# Patient Record
Sex: Female | Born: 1965 | Race: White | Hispanic: No | Marital: Single | Smoking: Former smoker
Health system: Southern US, Community
[De-identification: ages and names within clinical notes are randomized; demographics above are authoritative.]

## PROBLEM LIST (undated history)

## (undated) DIAGNOSIS — J189 Pneumonia, unspecified organism: Secondary | ICD-10-CM

## (undated) DIAGNOSIS — R296 Repeated falls: Secondary | ICD-10-CM

## (undated) DIAGNOSIS — J449 Chronic obstructive pulmonary disease, unspecified: Secondary | ICD-10-CM

## (undated) DIAGNOSIS — I10 Essential (primary) hypertension: Secondary | ICD-10-CM

## (undated) DIAGNOSIS — J3089 Other allergic rhinitis: Secondary | ICD-10-CM

## (undated) DIAGNOSIS — M1990 Unspecified osteoarthritis, unspecified site: Secondary | ICD-10-CM

## (undated) DIAGNOSIS — L089 Local infection of the skin and subcutaneous tissue, unspecified: Secondary | ICD-10-CM

## (undated) DIAGNOSIS — W19XXXA Unspecified fall, initial encounter: Secondary | ICD-10-CM

## (undated) DIAGNOSIS — M797 Fibromyalgia: Secondary | ICD-10-CM

## (undated) HISTORY — PX: FRACTURE SURGERY: SHX138

## (undated) HISTORY — PX: TUBAL LIGATION: SHX77

## (undated) HISTORY — PX: SHOULDER SURGERY: SHX246

## (undated) HISTORY — PX: PLANTAR FASCIA SURGERY: SHX746

## (undated) HISTORY — PX: LAPAROSCOPIC SALPINGOOPHERECTOMY: SUR795

## (undated) HISTORY — PX: TONSILLECTOMY: SUR1361

## (undated) HISTORY — PX: OTHER SURGICAL HISTORY: SHX169

## (undated) SURGERY — Surgical Case
Anesthesia: *Unknown

---

## 1998-05-26 ENCOUNTER — Ambulatory Visit: Admission: RE | Admit: 1998-05-26 | Discharge: 1998-05-26 | Payer: Self-pay | Admitting: Family Medicine

## 1998-09-23 ENCOUNTER — Ambulatory Visit (HOSPITAL_BASED_OUTPATIENT_CLINIC_OR_DEPARTMENT_OTHER): Admission: RE | Admit: 1998-09-23 | Discharge: 1998-09-23 | Payer: Self-pay | Admitting: Podiatry

## 1999-11-16 ENCOUNTER — Ambulatory Visit (HOSPITAL_COMMUNITY): Admission: RE | Admit: 1999-11-16 | Discharge: 1999-11-16 | Payer: Self-pay | Admitting: Orthopedic Surgery

## 1999-11-16 ENCOUNTER — Encounter: Payer: Self-pay | Admitting: Orthopedic Surgery

## 1999-12-15 ENCOUNTER — Encounter: Admission: RE | Admit: 1999-12-15 | Discharge: 2000-03-14 | Payer: Self-pay | Admitting: Anesthesiology

## 1999-12-15 ENCOUNTER — Encounter: Payer: Self-pay | Admitting: Anesthesiology

## 2004-04-03 ENCOUNTER — Emergency Department (HOSPITAL_COMMUNITY): Admission: EM | Admit: 2004-04-03 | Discharge: 2004-04-03 | Payer: Self-pay | Admitting: Emergency Medicine

## 2004-04-14 ENCOUNTER — Encounter: Admission: RE | Admit: 2004-04-14 | Discharge: 2004-04-14 | Payer: Self-pay | Admitting: Internal Medicine

## 2004-08-20 ENCOUNTER — Emergency Department (HOSPITAL_COMMUNITY): Admission: EM | Admit: 2004-08-20 | Discharge: 2004-08-20 | Payer: Self-pay | Admitting: Emergency Medicine

## 2005-06-01 ENCOUNTER — Emergency Department (HOSPITAL_COMMUNITY): Admission: EM | Admit: 2005-06-01 | Discharge: 2005-06-01 | Payer: Self-pay | Admitting: Emergency Medicine

## 2007-04-24 ENCOUNTER — Ambulatory Visit: Payer: Self-pay

## 2007-08-20 ENCOUNTER — Emergency Department: Payer: Self-pay | Admitting: Emergency Medicine

## 2008-06-01 ENCOUNTER — Ambulatory Visit: Payer: Self-pay | Admitting: Family Medicine

## 2008-06-01 ENCOUNTER — Inpatient Hospital Stay (HOSPITAL_COMMUNITY): Admission: EM | Admit: 2008-06-01 | Discharge: 2008-06-03 | Payer: Self-pay | Admitting: Emergency Medicine

## 2008-06-06 ENCOUNTER — Encounter: Payer: Self-pay | Admitting: Family Medicine

## 2008-06-07 ENCOUNTER — Telehealth: Payer: Self-pay | Admitting: *Deleted

## 2008-06-18 ENCOUNTER — Ambulatory Visit: Payer: Self-pay | Admitting: Family Medicine

## 2008-06-18 DIAGNOSIS — J988 Other specified respiratory disorders: Secondary | ICD-10-CM | POA: Insufficient documentation

## 2008-06-18 DIAGNOSIS — K5900 Constipation, unspecified: Secondary | ICD-10-CM | POA: Insufficient documentation

## 2008-06-18 DIAGNOSIS — R011 Cardiac murmur, unspecified: Secondary | ICD-10-CM | POA: Insufficient documentation

## 2008-06-20 ENCOUNTER — Ambulatory Visit: Payer: Self-pay | Admitting: Family Medicine

## 2008-06-20 DIAGNOSIS — F17200 Nicotine dependence, unspecified, uncomplicated: Secondary | ICD-10-CM | POA: Insufficient documentation

## 2008-12-08 ENCOUNTER — Emergency Department (HOSPITAL_COMMUNITY): Admission: EM | Admit: 2008-12-08 | Discharge: 2008-12-08 | Payer: Self-pay | Admitting: Emergency Medicine

## 2009-01-06 ENCOUNTER — Encounter: Admission: RE | Admit: 2009-01-06 | Discharge: 2009-01-06 | Payer: Self-pay | Admitting: Orthopedic Surgery

## 2010-07-29 ENCOUNTER — Ambulatory Visit (HOSPITAL_BASED_OUTPATIENT_CLINIC_OR_DEPARTMENT_OTHER): Admission: RE | Admit: 2010-07-29 | Discharge: 2010-07-29 | Payer: Self-pay | Admitting: Orthopedic Surgery

## 2010-10-18 ENCOUNTER — Emergency Department (HOSPITAL_COMMUNITY)
Admission: EM | Admit: 2010-10-18 | Discharge: 2010-10-18 | Payer: Self-pay | Source: Home / Self Care | Admitting: Emergency Medicine

## 2010-12-08 NOTE — Assessment & Plan Note (Signed)
Summary: Rx clinic: PFTs and smoking cessation   Vital Signs:  Patient Profile:   45 Years Old Female Height:     62 inches Weight:      223 pounds BMI:     40.93 Pulse rate:   109 / minute BP sitting:   131 / 84  (left arm)                  Visit Type:  PFT and smoking cessation  Chief Complaint:  wheezing and smoking cessation.  History of Present Illness: 45 yo WF presents for smoking cessation and PFT evaluation.  Patient recently discharged from the hospital for pneumonia.    She reports using albuterol every other day since discharge.  She has smoked for 27 years.  She smokes [place] lights, 1/2 to 3/4 ppd.  THis is about 8 to 12 mg of nicotine daily.   She waits around 15 minutes before having her first cigarette in the morning.  She states she is an 8 to 9 as far as readiness to quit.  She would like to have her quit date after her court date in September.  Barriers to smoking include stress, can smoke at work, depression, and enjoys smoking.  She wants to quit because she wants to be healthier, she wants to live life.   She reports Chantix is very expensive on her insurance.      Updated Prior Medication List: ALBUTEROL SULFATE (2.5 MG/3ML) 0.083%  NEBU (ALBUTEROL SULFATE) nebulized qid as needed      Risk Factors:  Tobacco use:  current    Counseled to quit/cut down tobacco use:  yes      Impression & Recommendations:  Problem # 1:  OTHER RESPIRATORY COMPLICATIONS (ICD-997.39) Assessment: Improved wheezing has improved slightly.  Spirometry evaluation with Pre and Post Bronchodilator reveals normal lung function.  Patient has been experiencing wheezing.Continue current albuterol treatment plan at this time.  Reviewed results of pulmonary function tests.  Pt verbalized understanding of results.  Written pt instructions provided.  TTFFC:  45   minutes.  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 Initiated bupropion. Patient denies seizure history.  Counseled on purpose, proper use, and potential adverse effects, including insomnia and dry mouth.  Written information provided:  F/U Rx Clinic Visit:  5 to 6 weeks.   F/U Phone Call: on 07/25/08 Total time with patient in face-to-face counseling: 45  minutes.  Patient seen with: Eden Lathe, PharmD student and Weston Brass, PharmD.   Her updated medication list for this problem includes:    Bupropion Hcl (smoking Deter) 150 Mg Xr12h-tab (Bupropion hcl (smoking deter)) .Marland Kitchen... Take one tablet daily on days 1 to 3, then increase to one tablet  Orders: Albuterol Sulfate Sol 1mg  unit dose (U0454) PFT Baseline-Pre/Post Bronchodiolator (PFT Baseline-Pre/Pos)   Complete Medication List: 1)  Albuterol Sulfate (2.5 Mg/21ml) 0.083% Nebu (Albuterol sulfate) .... Nebulized qid as needed 2)  Bupropion Hcl (smoking Deter) 150 Mg Xr12h-tab (Bupropion hcl (smoking deter)) .... Take one tablet daily on days 1 to 3, then increase to one tablet   Patient Instructions: 1)  Please schedule a follow-up  appointment in 5 to 6 weeks in the pharmacy clinic. 2)  Tobacco is very bad for your health and your loved ones! You Should stop smoking!. 3)  Stop Smoking Tips: Choose a Quit date. Your quit date is 07/24/08. Cut down before the Quit date. decide what you will do as a substitute when you feel the urge to smoke(gum,toothpick,exercise). 4)  Start bupropion 150 mg XR once a day for days 1 to 3, then increase to one tablet twice a day. 5)  Continue all your other medications. 6)  Follow up with Dr. Janalyn Harder as instructed.   Prescriptions: BUPROPION HCL (SMOKING DETER) 150 MG   XR12H-TAB (BUPROPION HCL (SMOKING DETER)) Take one tablet daily on days 1 to 3, then increase to one tablet  #60 x 3   Entered by:   Layani Foronda  Pharm D   Authorized by:   Angeline Slim MD   Signed by:   Steve Rattler  Pharm D on 06/20/2008   Method used:   Electronically sent to ...       [facility] [address]       [address]       [place], [place]  [address]       Ph: [phone]       Fax: [phone]   RxID:   [id number]  ] Tobacco Counseling   Currently uses tobacco.    Cigarettes      Year started smoking cigarettes:      1981     Number of packs per day smoked:      .5     Years smoked:            28     Packs per year:          182.50     Pack Years:            14  Counseled to quit/cut down on tobacco use.   Readiness to Quit:     Very Ready Cessation Stage:     Contemplative [employer] Quit Date:     07/24/2008  Quitting Barriers:      -  stress     -  depression     -  can smoke at work     -  social smoker     -  enjoys smoking  Quitting Motivators:      -  poor health     -  healthier lifestyle  Previous Quit Attempts   Previously Tried to quit:     Yes # of Previous quit attempts:     4 Longest Successful Quit Period:   2 months  Quit Methods Tried:      -  cold [place]  Reason for restarting:      -  stress     -  other  Comments: [place] seems to be a trigger for her to smoke  Smoking Cessation Plan   Readiness:     Very Ready Cessation Stage:   Contemplative [employer] Quit Date:   07/24/2008  Counseled on:      -  environment change     -  stress avoidance     -  nicotine withdrawal symptoms     -  medication use/compliance  New Medications: BUPROPION HCL (SMOKING DETER) 150 MG  XR12H-TAB (BUPROPION HCL (SMOKING DETER)) Take one tablet daily on days 1 to 3, then  increase to one tablet  New Problems: Hx of TOBACCO ABUSE (ICD-305.1)  Medications Added this Update:  BUPROPION HCL (SMOKING  DETER) 150 MG  XR12H-TAB (BUPROPION HCL (SMOKING DETER)) Take one tablet daily on days 1 to 3, then increase to one tablet  Orders Added this Update:  Albuterol Sulfate Sol 1mg  unit dose [N0272] PFT Baseline-Pre/Post Bronchodiolator [PFT Baseline-Pre/Pos]    Pulmonary Function Test Date: 06/20/2008 Height (in.): 62 Gender: Female  Pre-Spirometry FVC    Value: 2.94 L/min   Pred: 3.07 L/min     % Pred: 95 % FEV1    Value: 2.36 L     Pred: 2.59 L     % Pred: 91 % FEV1/FVC  Value: 80 %     Pred: 85 %     % Pred: 94 % FEF 25-75  Value: 2.3 L/min   Pred: 3.01 L/min     % Pred: 76 %  Post-Spirometry FVC    Value: 2.99 L/min   Pred: 3.07 L/min     % Pred: 97 % FEV1    Value: 2.43 L     Pred: 2.59 L     % Pred: 93 % FEV1/FVC  Value: 81 %     Pred: 85 %     % Pred: 95 % FEF 25-75  Value: 2.49 L/min   Pred: 3.01 L/min     % Pred: 82 %  Comments: Fair effort lung age: 86 yrs  Evaluation: normal  Recommendations: Start bupropion 150 mg XR once a day for days 1 to 3, then increase to one tablet twice a day Continue albuterol as needed

## 2010-12-08 NOTE — Miscellaneous (Signed)
Summary: needs out of work note  Clinical Lists Changes d/c from hosp Monday. was told to not go back until next week-8/3? note she has says out until 8/28. states she cannot go back yet. she was very sob while talking to me. states she saw Dr. Edythe Clarity Sr. Ta & Dr. Earlene Plater. message to all 3. she also has paperwork from [facility] that needs to be filled out asap. told her to have it dropped off but it may take a week or so. assured her I will try to get it completed & back to her asap.. her cell is [phone].[facility] CALDWELL RN  June 06, 2008 9:33 AM   Dr. Janalyn Harder is aware of patient's needs and will address them.

## 2010-12-08 NOTE — Miscellaneous (Signed)
  Clinical Lists Changes Dr Deirdre Priest filled out a "return to work" date on pink discharge form for pt.  This should be adequate.  He informed me that I should not fill out other forms from [facility] as these are for a longer term.  You can relay this message to pt.  You can return to work on the date that Dr Deirdre Priest wrote on her pink form.  Sharell Hilmer MD  June 06, 2008 12:40 PM

## 2010-12-08 NOTE — Progress Notes (Signed)
Summary: Triage  Phone Note Call from Patient Call back at [phone]   Summary of Call: Is requesting to speak with Kennon Rounds, states she is returning a call. Initial call taken by: Haydee Salter,  June 07, 2008 9:19 AM  Follow-up for Phone Call        many issues. has forms that need filling out & signed. she is not sure what they are. going back to work tomorrow. told her I will look them over but she may need to wait until her 1st appt here 8/11. Follow-up by: Golden Circle RN,  June 07, 2008 9:43 AM  Additional Follow-up for Phone Call Additional follow up Details #1::        pt here but states [facility] called her back & they do not need the forms Additional Follow-up by: Golden Circle RN,  June 07, 2008 10:09 AM

## 2011-01-21 LAB — POCT HEMOGLOBIN-HEMACUE: Hemoglobin: 16.4 g/dL — ABNORMAL HIGH (ref 12.0–15.0)

## 2011-02-16 ENCOUNTER — Emergency Department (HOSPITAL_COMMUNITY): Payer: BC Managed Care – HMO

## 2011-02-16 ENCOUNTER — Emergency Department (HOSPITAL_COMMUNITY)
Admission: EM | Admit: 2011-02-16 | Discharge: 2011-02-16 | Disposition: A | Discharge status: Home / Self Care | Payer: BC Managed Care – HMO | Attending: Emergency Medicine | Admitting: Emergency Medicine

## 2011-02-16 DIAGNOSIS — M1610 Unilateral primary osteoarthritis, unspecified hip: Secondary | ICD-10-CM | POA: Insufficient documentation

## 2011-02-16 DIAGNOSIS — M5430 Sciatica, unspecified side: Secondary | ICD-10-CM | POA: Insufficient documentation

## 2011-02-16 DIAGNOSIS — G8929 Other chronic pain: Secondary | ICD-10-CM | POA: Insufficient documentation

## 2011-02-16 DIAGNOSIS — M545 Low back pain, unspecified: Secondary | ICD-10-CM | POA: Insufficient documentation

## 2011-02-16 DIAGNOSIS — M25559 Pain in unspecified hip: Secondary | ICD-10-CM | POA: Insufficient documentation

## 2011-02-16 DIAGNOSIS — M169 Osteoarthritis of hip, unspecified: Secondary | ICD-10-CM | POA: Insufficient documentation

## 2011-02-16 DIAGNOSIS — M533 Sacrococcygeal disorders, not elsewhere classified: Secondary | ICD-10-CM | POA: Insufficient documentation
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
## 2011-03-23 NOTE — Discharge Summary (Signed)
[name], LARAMEE NO.:  [id number]   MEDICAL RECORD NO.:  [id number]          PATIENT TYPE:  INP   LOCATION:  3301                         FACILITY:  [facility]   PHYSICIAN:  Pearlean Brownie, M.D.DATE OF BIRTH:  [birth year]   DATE OF ADMISSION:  06/01/2008  DATE OF DISCHARGE:  06/03/2008                               DISCHARGE SUMMARY   PRIMARY CARE PHYSICIAN:  The patient does not have a primary care  [name] and is unassigned to [place].   DISCHARGE DIAGNOSES:  1. Pneumonia.  2. Heart murmur.   DISCHARGE MEDICATIONS:  1. Prednisone 60 mg p.o. daily x4 days.  2. Avelox 400 mg p.o. daily x4 days.  3. Protonix 40 mg p.o. b.i.d. twice daily x4 days.  4. Albuterol 90 mcg/spray MDI 2 puffs inhaled q.4-6 h. p.r.n.      shortness of breath.  5. Tussionex 5 mL p.o. b.i.d. as needed for cough.   CONSULTS:  None.   PROCEDURES:  Chest x-ray June 02, 2005, shows persistent opacity in the  left hilar region, slightly increased interstitial marking in the left  lung base.  Findings remained concerning for infection.  X-ray of the right index finger on June 01, 2008, shows that there is  soft tissue deformity.  There is a nondisplaced fracture of the distal  phalanx with definable extension to the articular surface.   LABORATORY DATA:  On discharge, sodium 137, potassium 3.9, chloride 107,  bicarb 22, BUN 6, creatinine 0.55, glucose 120, and calcium 8.9.  Urine  negative for Legionella.  Urine negative for Streptococcus pneumonia.   BRIEF HOSPITAL COURSE:  This is a 45 year old female who was admitted  for pneumonia/cough.  1. Pneumonia/cough.  The patient is not requiring oxygen.  She is      saturating at 95% to 98% on room air.  She has been afebrile during      this admission.  She still had a nonproductive cough.  Patient was      albuterol q.4 h. with q.2 h. p.r.n. and Atrovent nebulizer.  We      will transition her over to albuterol MDI for discharge.   Continue      on a course of prednisone for a total of 5 days and continue on a      course of Avelox for a total of 5 days.  Chest x-ray showed opacity      in the left hilar region.  This may be concerning for infection,      but should be followed up in 6 weeks after the patient's pneumonia      course has been cured.  At that time, a followup x-ray should be      done to compare with current x-ray.  CT is not warranted at this      time, but there are some concerns due to patient's smoking history.      Will consider futher work up with CT should xray be concerning or      indeterminate.  2. Heart murmur.  The patient has been tachycardic, has a systolic  murmur at the left upper sternal border.  We will continue to      monitor this and this will be worked up on an outpatient basis.  We      will consider an echo on an outpatient basis if the patient      continues to have the murmur.   DISCHARGE INSTRUCTIONS:  The patient is discharged home with no  restriction on her activity.  She may return to work on June 05, 2008.  The patient has been counseled on smoking cessation.   FOLLOW UP APPOINTMENTS:  I would set up an appointment for the patient  to see Dr. Madelon Lips at the [facility]  She has an  appointment there on June 20, 2008, at 11:15 for a pulmonary function  test and also smoking cessation.  She will follow up with Dr. Janalyn Harder, at the  [facility] on June 18, 2008, at 2:50.   DISCHARGE CONDITION:  The patient was discharged to home in stable  medical condition.      April Slim, MD  Electronically Signed      Pearlean Brownie, M.D.  Electronically Signed    CT/MEDQ  D:  06/03/2008  T:  06/04/2008  Job:  [id number]   cc:   Doroteo Bradford, MD

## 2011-06-01 ENCOUNTER — Emergency Department (HOSPITAL_COMMUNITY)
Admission: EM | Admit: 2011-06-01 | Discharge: 2011-06-01 | Disposition: A | Discharge status: Home / Self Care | Payer: BC Managed Care – HMO | Attending: Emergency Medicine | Admitting: Emergency Medicine

## 2011-06-01 DIAGNOSIS — L299 Pruritus, unspecified: Secondary | ICD-10-CM | POA: Insufficient documentation

## 2011-06-01 DIAGNOSIS — L259 Unspecified contact dermatitis, unspecified cause: Secondary | ICD-10-CM | POA: Insufficient documentation

## 2011-08-06 LAB — DIFFERENTIAL
Basophils Absolute: 0
Basophils Relative: 0
Eosinophils Absolute: 0
Eosinophils Relative: 1
Lymphocytes Relative: 20
Lymphs Abs: 1.4
Monocytes Absolute: 0.4
Monocytes Relative: 6
Neutro Abs: 5.3
Neutrophils Relative %: 74

## 2011-08-06 LAB — BASIC METABOLIC PANEL
BUN: 6
BUN: 8
BUN: 9
CO2: 19
CO2: 22
CO2: 26
Calcium: 8.9
Calcium: 8.9
Calcium: 9.2
Chloride: 102
Chloride: 105
Chloride: 99
Creatinine, Ser: 0.55
Creatinine, Ser: 0.61
Creatinine, Ser: 0.8
GFR calc Af Amer: >60
GFR calc Af Amer: >60
GFR calc Af Amer: >60
GFR calc non Af Amer: >60
GFR calc non Af Amer: >60
GFR calc non Af Amer: >60
Glucose, Bld: 116 — ABNORMAL HIGH
Glucose, Bld: 120 — ABNORMAL HIGH
Glucose, Bld: 184 — ABNORMAL HIGH
Potassium: 3.3 — ABNORMAL LOW
Potassium: 3.9
Potassium: 4.1
Sodium: 133 — ABNORMAL LOW
Sodium: 137
Sodium: 139

## 2011-08-06 LAB — CBC
HCT: 41.7
HCT: 44.5
Hemoglobin: 14.1
Hemoglobin: 15
MCHC: 33.7
MCHC: 33.8
MCV: 91.5
MCV: 91.7
Platelets: 233
Platelets: 237
RBC: 4.56
RBC: 4.85
RDW: 12.9
RDW: 13
WBC: 7.2
WBC: 8.3

## 2011-08-06 LAB — LEGIONELLA ANTIGEN, URINE: Legionella Antigen, Urine: NEGATIVE

## 2011-08-06 LAB — STREP PNEUMONIAE URINARY ANTIGEN: Strep Pneumo Urinary Antigen: NEGATIVE

## 2011-08-06 LAB — H1N1 SCREEN (PCR)

## 2011-08-18 ENCOUNTER — Ambulatory Visit: Payer: Self-pay | Admitting: Pain Medicine

## 2011-08-25 ENCOUNTER — Ambulatory Visit: Payer: Self-pay | Admitting: Pain Medicine

## 2011-09-14 ENCOUNTER — Ambulatory Visit: Payer: Self-pay | Admitting: Pain Medicine

## 2011-09-15 ENCOUNTER — Ambulatory Visit: Payer: Self-pay | Admitting: Pain Medicine

## 2011-10-07 ENCOUNTER — Ambulatory Visit: Payer: Self-pay | Admitting: Pain Medicine

## 2011-10-18 ENCOUNTER — Ambulatory Visit: Payer: Self-pay | Admitting: Pain Medicine

## 2012-09-20 ENCOUNTER — Ambulatory Visit: Payer: Self-pay | Admitting: Obstetrics and Gynecology

## 2012-09-20 LAB — BASIC METABOLIC PANEL
Anion Gap: 8 (ref 7–16)
BUN: 17 mg/dL (ref 7–18)
Calcium, Total: 8.8 mg/dL (ref 8.5–10.1)
Chloride: 103 mmol/L (ref 98–107)
Co2: 28 mmol/L (ref 21–32)
Creatinine: 0.72 mg/dL (ref 0.60–1.30)
EGFR (African American): >60
EGFR (Non-African Amer.): >60
Glucose: 87 mg/dL (ref 65–99)
Osmolality: 278 (ref 275–301)
Potassium: 4.4 mmol/L (ref 3.5–5.1)
Sodium: 139 mmol/L (ref 136–145)

## 2012-09-20 LAB — CBC
HCT: 44 % (ref 35.0–47.0)
HGB: 14.6 g/dL (ref 12.0–16.0)
MCH: 31.2 pg (ref 26.0–34.0)
MCHC: 33.2 g/dL (ref 32.0–36.0)
MCV: 94 fL (ref 80–100)
Platelet: 377 10*3/uL (ref 150–440)
RBC: 4.69 10*6/uL (ref 3.80–5.20)
RDW: 13 % (ref 11.5–14.5)
WBC: 11.2 10*3/uL — ABNORMAL HIGH (ref 3.6–11.0)

## 2012-09-26 ENCOUNTER — Ambulatory Visit: Payer: Self-pay | Admitting: Obstetrics and Gynecology

## 2012-09-28 LAB — PATHOLOGY REPORT

## 2012-10-17 ENCOUNTER — Ambulatory Visit: Payer: Self-pay | Admitting: Obstetrics and Gynecology

## 2013-02-07 ENCOUNTER — Other Ambulatory Visit: Payer: Self-pay | Admitting: Pain Medicine

## 2013-02-07 DIAGNOSIS — M545 Low back pain, unspecified: Secondary | ICD-10-CM

## 2013-02-18 ENCOUNTER — Other Ambulatory Visit: Payer: BC Managed Care – HMO

## 2013-02-27 ENCOUNTER — Ambulatory Visit
Admission: RE | Admit: 2013-02-27 | Discharge: 2013-02-27 | Disposition: A | Discharge status: Home / Self Care | Payer: BC Managed Care – HMO | Source: Ambulatory Visit | Attending: Pain Medicine | Admitting: Pain Medicine

## 2013-02-27 DIAGNOSIS — M545 Low back pain, unspecified: Secondary | ICD-10-CM

## 2013-02-28 ENCOUNTER — Other Ambulatory Visit: Payer: BC Managed Care – HMO

## 2013-11-08 HISTORY — PX: KNEE CARTILAGE SURGERY: SHX688

## 2015-01-24 ENCOUNTER — Emergency Department (HOSPITAL_COMMUNITY)
Admission: EM | Admit: 2015-01-24 | Discharge: 2015-01-25 | Disposition: A | Discharge status: Home / Self Care | Payer: BLUE CROSS/BLUE SHIELD | Attending: Emergency Medicine | Admitting: Emergency Medicine

## 2015-01-24 ENCOUNTER — Encounter (HOSPITAL_COMMUNITY): Payer: Self-pay | Admitting: *Deleted

## 2015-01-24 DIAGNOSIS — Z720 Tobacco use: Secondary | ICD-10-CM | POA: Insufficient documentation

## 2015-01-24 DIAGNOSIS — T23201A Burn of second degree of right hand, unspecified site, initial encounter: Secondary | ICD-10-CM

## 2015-01-24 DIAGNOSIS — Y939 Activity, unspecified: Secondary | ICD-10-CM | POA: Diagnosis not present

## 2015-01-24 DIAGNOSIS — Y278XXA Contact with other hot objects, undetermined intent, initial encounter: Secondary | ICD-10-CM | POA: Diagnosis not present

## 2015-01-24 DIAGNOSIS — T23001A Burn of unspecified degree of right hand, unspecified site, initial encounter: Secondary | ICD-10-CM | POA: Diagnosis present

## 2015-01-24 DIAGNOSIS — Y999 Unspecified external cause status: Secondary | ICD-10-CM | POA: Diagnosis not present

## 2015-01-24 DIAGNOSIS — Y92000 Kitchen of unspecified non-institutional (private) residence as  the place of occurrence of the external cause: Secondary | ICD-10-CM | POA: Diagnosis not present
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
## 2015-01-25 MED ORDER — BACITRACIN ZINC 500 UNIT/GM EX OINT: 1.0000 "application " | TOPICAL_OINTMENT | Freq: Two times a day (BID) | CUTANEOUS | Status: DC

## 2015-01-25 MED ORDER — IBUPROFEN 400 MG PO TABS
800.0000 mg | ORAL_TABLET | Freq: Once | ORAL | Status: AC
  Administered 2015-01-25: 800 mg via ORAL
  Filled 2015-01-25: qty 2

## 2015-01-25 MED ORDER — ACETAMINOPHEN 325 MG PO TABS
650.0000 mg | ORAL_TABLET | Freq: Once | ORAL | Status: DC
  Filled 2015-01-25: qty 2

## 2015-01-25 NOTE — Discharge Instructions (Signed)
Keep wound clean with soap and water. Refer to attached documents for more information.

## 2015-01-25 NOTE — ED Notes (Signed)
Applied bacitracin no affected area on right hand, covered with non-adherent bandage and wrapped with stretch bandage.

## 2015-01-25 NOTE — ED Provider Notes (Signed)
CSN: [id number]     Arrival date & time 01/24/15  2254 History   First MD Initiated Contact with Patient 01/24/15 2306     Chief Complaint  Patient presents with  . Hand Burn     (Consider location/radiation/quality/duration/timing/severity/associated sxs/prior Treatment) Patient is a 49 y.o. female presenting with burn. The history is provided by the patient. No language interpreter was used.  Burn Burn location:  Hand Hand burn location:  [place] of R hand Burn quality:  Red, ruptured blister and painful Time since incident:  2 hours Progression:  Unchanged Pain details:    Severity:  Severe   Duration:  2 hours   Timing:  Constant   Progression:  Unchanged Mechanism of burn:  Hot surface Incident location:  Kitchen Relieved by:  Nothing Worsened by:  Nothing tried Ineffective treatments:  None tried Associated symptoms: no difficulty swallowing and no shortness of breath   Tetanus status:  Unknown   History reviewed. No pertinent past medical history. History reviewed. No pertinent past surgical history. No family history on file. History  Substance Use Topics  . Smoking status: Current Every Day Smoker  . Smokeless tobacco: Not on file  . Alcohol Use: Yes   OB History    No data available     Review of Systems  Constitutional: Negative for fever, chills and fatigue.  HENT: Negative for trouble swallowing.   Eyes: Negative for visual disturbance.  Respiratory: Negative for shortness of breath.   Cardiovascular: Negative for chest pain and palpitations.  Gastrointestinal: Negative for nausea, vomiting, abdominal pain and diarrhea.  Genitourinary: Negative for dysuria and difficulty urinating.  Musculoskeletal: Negative for arthralgias and neck pain.  Skin: Positive for wound. Negative for color change.  Neurological: Negative for dizziness and weakness.  Psychiatric/Behavioral: Negative for dysphoric mood.      Allergies  Augmentin and Sulfa  antibiotics  Home Medications   Prior to Admission medications   Not on File   BP 174/103 mmHg  Pulse 96  Temp(Src) 97.6 F (36.4 C)  Resp 18  Ht 5\' 1"  (1.549 m)  Wt 210 lb (95.255 kg)  BMI 39.70 kg/m2  SpO2 96% Physical Exam  Constitutional: She is oriented to person, place, and time. She appears well-developed and well-nourished. No distress.  HENT:  Head: Normocephalic and atraumatic.  Eyes: Conjunctivae and EOM are normal.  Neck: Normal range of motion.  Cardiovascular: Normal rate and regular rhythm.  Exam reveals no gallop and no friction rub.   No murmur heard. Pulmonary/Chest: Effort normal and breath sounds normal. She has no wheezes. She has no rales. She exhibits no tenderness.  Abdominal: Soft. There is no tenderness.  Musculoskeletal: Normal range of motion.  Full ROM of right hand.   Neurological: She is alert and oriented to person, place, and time. Coordination normal.  Full sensation to right hand. Speech is goal-oriented. Moves limbs without ataxia.   Skin: Skin is warm and dry.  Second degree burn to the right dorsal hand that covers a 5x4cm area. No open wounds.   Psychiatric: She has a normal mood and affect. Her behavior is normal.  Nursing note and vitals reviewed.   ED Course  Procedures (including critical care time) Labs Review Labs Reviewed - No data to display  Imaging Review No results found.   EKG Interpretation None      MDM   Final diagnoses:  Second degree burn of hand, right, initial encounter    12:14 AM Patient will have  bacitracin to the burn with instructions to gently clean the area with soap and water. Vitals stable and patient afebrile.     Alvina Chou, PA-C 01/25/15 0020  Fredia Sorrow, MD 01/25/15 [phone]

## 2015-02-25 NOTE — Op Note (Signed)
PATIENT NAME:  [name], [name] MR#:  [id number] DATE OF BIRTH:  Dec 24, 1965  DATE OF PROCEDURE:  09/26/2012  PREOPERATIVE DIAGNOSES: Postmenopausal bleeding with thickened endometrial stripe on ultrasound as well as bilateral ovarian cysts.   POSTOPERATIVE DIAGNOSES: Postmenopausal bleeding with thickened endometrial stripe on ultrasound as well as bilateral ovarian cysts including endometrial polyp.   OPERATIONS PERFORMED:  1. Hysteroscopy.  2. Dilatation and curettage. 3. Laparoscopic bilateral salpingo-oophorectomy.   ANESTHESIA USED: General.   PRIMARY SURGEON: Stoney Bang. Georgianne Fick, MD  ASSISTANT: Dr. Verlene Mayer   ESTIMATED BLOOD LOSS: 50 mL.  OPERATIVE FLUIDS: 1 liter of crystalloid.   COMPLICATIONS: None.   PREOPERATIVE ANTIBIOTICS: None.   INTRAOPERATIVE FINDINGS: Hysteroscopy revealed an endometrial polyp filling the uterine cavity. This was removed in its entirety using polyp forceps. The laparoscopic portion of the case revealed a left ovarian simple cyst. The right ovary appeared grossly normal as it contained a cyst as well on ultrasound. Both ovaries removed without spillage using a reusable Endo Catch bag.   SPECIMENS REMOVED: Right tube and ovary, left tube and ovary as well as endometrial curettings and polyp.   PATIENT CONDITION FOLLOWING PROCEDURE: Stable.   PROCEDURE IN DETAIL: Risks, benefits, and alternatives of procedure were discussed with the patient prior to proceeding to the Operating Room. Given the patient's finding of bilateral ovarian cysts in a postmenopausal patient and given the fact that she had initially presented with postmenopausal bleeding with a thickened endometrial stripe and was going to the Operating Room for further evaluation CA-125 was obtained preoperatively and was noted to be normal. Decision was made to proceed with bilateral salpingo-oophorectomy for evaluation of the ovarian cyst. Patient was taken to the Operating Room were  general anesthesia was administered. She was positioned in the dorsal lithotomy position, prepped and draped in the usual sterile fashion. Attention was first turned to the patient's pelvis for the hysteroscopic portion of the case. A Foley catheter was placed and following placement of the Foley catheter the anterior lip of the cervix was visualized using a sterile speculum and grasped with a single-tooth tenaculum. Following this, the cervix was serially dilated using Hegar dilators. Following dilation of the cervix hysteroscopy was performed noting the above findings. The polyp was then removed using polyp forceps. Sharp curettage was performed noting good uterine cry throughout. Post dilatation and curettage hysteroscopy revealed complete removal of the polyp. Following the hysteroscopic portion of the case a Hulka tenaculum was placed to allow uterine manipulation. Attention was then turned to the patient's abdomen. The umbilicus was infiltrated with 1% lidocaine. Stab incision was made at the base of the umbilicus and a 5 mm XL trocar was used to gain entry into the peritoneum under direct visualization. Insufflation was begun. A 5 mm right assistant port and an 11 mm left assistant port were placed under direct visualization. Following placement of the assistant ports, survey of the pelvis noted the above findings. Of note, the ureter did have a small, approximately 1 to 2 cm, pedunculated anterior fundal fibroid. This was not removed. Attention was first turned to the patient's left ovary. The IP ligament was identified, ligated and transected using the 5 mm LigaSure. The fallopian tube was then transected off of the mesosalpinx using the 5 mm Harmonic device. The tube was transected off the corneal portion of the uterus using the 5 mm Harmonic. Following removal of the tube the utero-ovarian ligament was identified and transected using the 5 mm Harmonic. Attention was then turned  to the right adnexa which  was dissected off in a similar fashion using the 5 mm Harmonic device. Following removal of both tubes and ovaries a reusable Endo Catch bag was placed through the 11 mm port site and the ovaries were removed without spillage of contents. Following this all pedicles were inspected and noted to be hemostatic. The pelvis was irrigated, and the 11 mm port site was closed using an Endo Close device. Following this, there was no fascial defect noted. The remaining 5 mm ports were removed following evacuation of pneumoperitoneum. The 11 mm port site was closed with 4-0 Monocryl in subcuticular fashion. All port sites were then dressed with Dermabond.    Sponge, needle, and instrument counts were correct x2.   ____________________________ Stoney Bang. Georgianne Fick, MD ams:cms D: 09/26/2012 22:20:58 ET T: 09/27/2012 10:44:18 ET JOB#: [id number]  cc: Stoney Bang. Georgianne Fick, MD, <Dictator> Conan Bowens Madelon Lips MD ELECTRONICALLY SIGNED 10/12/2012 15:37

## 2015-03-27 ENCOUNTER — Other Ambulatory Visit: Payer: Self-pay | Admitting: Orthopedic Surgery

## 2015-04-28 NOTE — Pre-Procedure Instructions (Signed)
[name]  04/28/2015        Your procedure is scheduled on July 1st, Friday.   Report to [facility] Admitting at 5:30 A.M.  Call this number if you have problems the morning of surgery:  [phone]   Remember:  Do not eat food or drink liquids after midnight Thursday.   Take these medicines the morning of surgery with A SIP OF WATER: Nothing   Do not wear jewelry, make-up or nail polish.  Do not wear lotions, powders, or perfumes.  You may NOT  wear deodorant the day of surgery.  Do not shave underarms & legs 48 hours prior to surgery.    Do not bring valuables to the hospital.  [facility] is not responsible for any belongings or valuables.  Contacts, dentures or bridgework may not be worn into surgery.  Leave your suitcase in the car.  After surgery it may be brought to your room.  For patients admitted to the hospital, discharge time will be determined by your treatment team.  Name and phone number of your driver:     Special instructions:  "Preparing for Surgery" instruction sheet.  Please read over the following fact sheets that you were given. Pain Booklet, Coughing and Deep Breathing, MRSA Information and Surgical Site Infection Prevention

## 2015-04-29 ENCOUNTER — Encounter (HOSPITAL_COMMUNITY)
Admission: RE | Admit: 2015-04-29 | Discharge: 2015-04-29 | Disposition: A | Discharge status: Home / Self Care | Payer: BLUE CROSS/BLUE SHIELD | Source: Ambulatory Visit | Attending: Orthopedic Surgery | Admitting: Orthopedic Surgery

## 2015-04-29 ENCOUNTER — Ambulatory Visit (HOSPITAL_COMMUNITY)
Admission: RE | Admit: 2015-04-29 | Discharge: 2015-04-29 | Disposition: A | Discharge status: Home / Self Care | Payer: BLUE CROSS/BLUE SHIELD | Source: Ambulatory Visit | Attending: Orthopedic Surgery | Admitting: Orthopedic Surgery

## 2015-04-29 ENCOUNTER — Encounter (HOSPITAL_COMMUNITY): Payer: Self-pay

## 2015-04-29 DIAGNOSIS — M19012 Primary osteoarthritis, left shoulder: Secondary | ICD-10-CM | POA: Insufficient documentation

## 2015-04-29 DIAGNOSIS — Z01812 Encounter for preprocedural laboratory examination: Secondary | ICD-10-CM | POA: Diagnosis not present

## 2015-04-29 DIAGNOSIS — Z01810 Encounter for preprocedural cardiovascular examination: Secondary | ICD-10-CM | POA: Diagnosis not present

## 2015-04-29 DIAGNOSIS — Z0183 Encounter for blood typing: Secondary | ICD-10-CM | POA: Insufficient documentation

## 2015-04-29 DIAGNOSIS — Z01818 Encounter for other preprocedural examination: Secondary | ICD-10-CM

## 2015-04-29 DIAGNOSIS — Z87891 Personal history of nicotine dependence: Secondary | ICD-10-CM | POA: Diagnosis not present

## 2015-04-29 HISTORY — DX: Chronic obstructive pulmonary disease, unspecified: J44.9

## 2015-04-29 HISTORY — DX: Fibromyalgia: M79.7

## 2015-04-29 HISTORY — DX: Unspecified osteoarthritis, unspecified site: M19.90

## 2015-04-29 LAB — COMPREHENSIVE METABOLIC PANEL
ALT: 17 U/L (ref 14–54)
AST: 14 U/L — ABNORMAL LOW (ref 15–41)
Albumin: 3.8 g/dL (ref 3.5–5.0)
Alkaline Phosphatase: 105 U/L (ref 38–126)
Anion gap: 11 (ref 5–15)
BUN: 16 mg/dL (ref 6–20)
CO2: 28 mmol/L (ref 22–32)
Calcium: 9.3 mg/dL (ref 8.9–10.3)
Chloride: 101 mmol/L (ref 101–111)
Creatinine, Ser: 0.6 mg/dL (ref 0.44–1.00)
GFR calc Af Amer: >60 mL/min (ref >60)
GFR calc non Af Amer: >60 mL/min (ref >60)
Glucose, Bld: 97 mg/dL (ref 65–99)
Potassium: 4.1 mmol/L (ref 3.5–5.1)
Sodium: 140 mmol/L (ref 135–145)
Total Bilirubin: 0.3 mg/dL (ref 0.3–1.2)
Total Protein: 7.1 g/dL (ref 6.5–8.1)

## 2015-04-29 LAB — CBC WITH DIFFERENTIAL/PLATELET
Basophils Absolute: 0 10*3/uL (ref 0.0–0.1)
Basophils Relative: 0 % (ref 0–1)
Eosinophils Absolute: 0.4 10*3/uL (ref 0.0–0.7)
Eosinophils Relative: 3 % (ref 0–5)
HCT: 41.6 % (ref 36.0–46.0)
Hemoglobin: 13.9 g/dL (ref 12.0–15.0)
Lymphocytes Relative: 33 % (ref 12–46)
Lymphs Abs: 4.5 10*3/uL — ABNORMAL HIGH (ref 0.7–4.0)
MCH: 31 pg (ref 26.0–34.0)
MCHC: 33.4 g/dL (ref 30.0–36.0)
MCV: 92.7 fL (ref 78.0–100.0)
Monocytes Absolute: 1 10*3/uL (ref 0.1–1.0)
Monocytes Relative: 7 % (ref 3–12)
Neutro Abs: 7.7 10*3/uL (ref 1.7–7.7)
Neutrophils Relative %: 57 % (ref 43–77)
Platelets: 382 10*3/uL (ref 150–400)
RBC: 4.49 MIL/uL (ref 3.87–5.11)
RDW: 13 % (ref 11.5–15.5)
WBC: 13.6 10*3/uL — ABNORMAL HIGH (ref 4.0–10.5)

## 2015-04-29 LAB — URINALYSIS, ROUTINE W REFLEX MICROSCOPIC
Bilirubin Urine: NEGATIVE
Glucose, UA: NEGATIVE mg/dL
Hgb urine dipstick: NEGATIVE
Ketones, ur: NEGATIVE mg/dL
Leukocytes, UA: NEGATIVE
Nitrite: NEGATIVE
Protein, ur: NEGATIVE mg/dL
Specific Gravity, Urine: 1.025 (ref 1.005–1.030)
Urobilinogen, UA: 0.2 mg/dL (ref 0.0–1.0)
pH: 5.5 (ref 5.0–8.0)

## 2015-04-29 LAB — SURGICAL PCR SCREEN
MRSA, PCR: NEGATIVE
Staphylococcus aureus: POSITIVE — AB

## 2015-04-29 LAB — PROTIME-INR
INR: 1.11 (ref 0.00–1.49)
Prothrombin Time: 14.5 seconds (ref 11.6–15.2)

## 2015-04-29 LAB — ABO/RH: ABO/RH(D): O POS

## 2015-04-29 LAB — TYPE AND SCREEN
ABO/RH(D): O POS
Antibody Screen: NEGATIVE

## 2015-04-29 LAB — APTT: aPTT: 32 seconds (ref 24–37)

## 2015-04-29 NOTE — Progress Notes (Signed)
Patient states that "silent MI' according a EKG tracing done in the office.  PCP Dr. Bernita Buffy (at [place])  then had ECHO done which came out "normal".  I have requested medical clearance note, old ekg for comparison, and echo results.  Spoke with Amy.  DA

## 2015-04-30 NOTE — Progress Notes (Addendum)
Anesthesia Chart Review:  Pt is 49 year old female scheduled for L total shoulder arthroplasty on 05/09/2015 with Dr. Berenice Primas.   PMH includes: COPD. Current smoker. BMI 41.  Preoperative labs reviewed.    Chest x-ray 04/29/2015 reviewed. No active cardiopulmonary disease.   EKG 04/29/2015: NSR. Poor anterior R wave progression. When compared with ECG of 04/03/04, the rate is slower per Dr. Percival Spanish.   Echo 03/26/2015: -Normal chamber sizes -Mild LV systolic dysfunction -normal wall motion -mild LVH -Trace to mild tricuspid and mitral regurgitation -Mild aortic regurgitation  Pt has medical clearance from Dr. Raquel [facility].   If no changes, I anticipate pt can proceed with surgery as scheduled.   April Cass, FNP-BC [facility]/Anesthesiology Phone: [phone] 05/01/2015 9:19 AM

## 2015-05-08 MED ORDER — CLINDAMYCIN PHOSPHATE 900 MG/50ML IV SOLN
900.0000 mg | INTRAVENOUS | Status: AC
  Administered 2015-05-09: 900 mg via INTRAVENOUS
  Filled 2015-05-08: qty 50

## 2015-05-09 ENCOUNTER — Inpatient Hospital Stay (HOSPITAL_COMMUNITY)
Admission: RE | Admit: 2015-05-09 | Discharge: 2015-05-11 | DRG: 483 | Disposition: A | Discharge status: Home / Self Care | Payer: BLUE CROSS/BLUE SHIELD | Source: Ambulatory Visit | Attending: Orthopedic Surgery | Admitting: Orthopedic Surgery

## 2015-05-09 ENCOUNTER — Inpatient Hospital Stay (HOSPITAL_COMMUNITY): Payer: BLUE CROSS/BLUE SHIELD | Admitting: Emergency Medicine

## 2015-05-09 ENCOUNTER — Inpatient Hospital Stay (HOSPITAL_COMMUNITY): Payer: BLUE CROSS/BLUE SHIELD

## 2015-05-09 ENCOUNTER — Inpatient Hospital Stay (HOSPITAL_COMMUNITY): Payer: BLUE CROSS/BLUE SHIELD | Admitting: Anesthesiology

## 2015-05-09 ENCOUNTER — Encounter (HOSPITAL_COMMUNITY): Payer: Self-pay | Admitting: Anesthesiology

## 2015-05-09 ENCOUNTER — Encounter (HOSPITAL_COMMUNITY)
Admission: RE | Disposition: A | Discharge status: Home / Self Care | Payer: Self-pay | Source: Ambulatory Visit | Attending: Orthopedic Surgery

## 2015-05-09 DIAGNOSIS — M19012 Primary osteoarthritis, left shoulder: Principal | ICD-10-CM | POA: Diagnosis present

## 2015-05-09 DIAGNOSIS — Z881 Allergy status to other antibiotic agents status: Secondary | ICD-10-CM

## 2015-05-09 DIAGNOSIS — Z91038 Other insect allergy status: Secondary | ICD-10-CM | POA: Diagnosis not present

## 2015-05-09 DIAGNOSIS — J449 Chronic obstructive pulmonary disease, unspecified: Secondary | ICD-10-CM | POA: Diagnosis present

## 2015-05-09 DIAGNOSIS — Z882 Allergy status to sulfonamides status: Secondary | ICD-10-CM

## 2015-05-09 DIAGNOSIS — M797 Fibromyalgia: Secondary | ICD-10-CM | POA: Diagnosis present

## 2015-05-09 DIAGNOSIS — Z91030 Bee allergy status: Secondary | ICD-10-CM

## 2015-05-09 DIAGNOSIS — Z7951 Long term (current) use of inhaled steroids: Secondary | ICD-10-CM

## 2015-05-09 DIAGNOSIS — F17210 Nicotine dependence, cigarettes, uncomplicated: Secondary | ICD-10-CM | POA: Diagnosis present

## 2015-05-09 DIAGNOSIS — Z96619 Presence of unspecified artificial shoulder joint: Secondary | ICD-10-CM

## 2015-05-09 HISTORY — PX: TOTAL SHOULDER ARTHROPLASTY: SHX126

## 2015-05-09 SURGERY — ARTHROPLASTY, SHOULDER, TOTAL
Anesthesia: General | Site: Shoulder | Laterality: Left

## 2015-05-09 MED ORDER — HYDROMORPHONE HCL 1 MG/ML IJ SOLN
INTRAMUSCULAR | Status: AC
  Filled 2015-05-09: qty 1

## 2015-05-09 MED ORDER — MORPHINE SULFATE ER 30 MG PO TBCR
30.0000 mg | EXTENDED_RELEASE_TABLET | Freq: Two times a day (BID) | ORAL | Status: DC
  Administered 2015-05-09 – 2015-05-11 (×4): 30 mg via ORAL
  Filled 2015-05-09 (×4): qty 1

## 2015-05-09 MED ORDER — NEOSTIGMINE METHYLSULFATE 10 MG/10ML IV SOLN
INTRAVENOUS | Status: DC | PRN
  Administered 2015-05-09: 3 mg via INTRAVENOUS

## 2015-05-09 MED ORDER — BUPIVACAINE-EPINEPHRINE (PF) 0.5% -1:200000 IJ SOLN
INTRAMUSCULAR | Status: DC | PRN
  Administered 2015-05-09: 30 mL via PERINEURAL

## 2015-05-09 MED ORDER — GLYCOPYRROLATE 0.2 MG/ML IJ SOLN: INTRAMUSCULAR | Status: DC | PRN

## 2015-05-09 MED ORDER — ONDANSETRON HCL 4 MG/2ML IJ SOLN
INTRAMUSCULAR | Status: AC
  Filled 2015-05-09: qty 2

## 2015-05-09 MED ORDER — LIDOCAINE HCL (CARDIAC) 20 MG/ML IV SOLN
INTRAVENOUS | Status: DC | PRN
  Administered 2015-05-09: 100 mg via INTRAVENOUS

## 2015-05-09 MED ORDER — HYDROMORPHONE HCL 1 MG/ML IJ SOLN
0.2500 mg | INTRAMUSCULAR | Status: DC | PRN
  Administered 2015-05-09 (×2): 0.5 mg via INTRAVENOUS

## 2015-05-09 MED ORDER — PREGABALIN 100 MG PO CAPS
300.0000 mg | ORAL_CAPSULE | Freq: Every day | ORAL | Status: DC
  Administered 2015-05-09 – 2015-05-10 (×2): 300 mg via ORAL
  Filled 2015-05-09 (×2): qty 3

## 2015-05-09 MED ORDER — ACETAMINOPHEN 325 MG PO TABS
650.0000 mg | ORAL_TABLET | Freq: Four times a day (QID) | ORAL | Status: DC | PRN
Start: 2015-05-09 — End: 2015-05-11

## 2015-05-09 MED ORDER — VANCOMYCIN HCL IN DEXTROSE 1-5 GM/200ML-% IV SOLN
1000.0000 mg | Freq: Two times a day (BID) | INTRAVENOUS | Status: AC
  Administered 2015-05-09: 1000 mg via INTRAVENOUS
  Filled 2015-05-09: qty 200

## 2015-05-09 MED ORDER — BUPIVACAINE HCL (PF) 0.25 % IJ SOLN
INTRAMUSCULAR | Status: AC
  Filled 2015-05-09: qty 30

## 2015-05-09 MED ORDER — TRANEXAMIC ACID 1000 MG/10ML IV SOLN
1000.0000 mg | INTRAVENOUS | Status: AC
  Administered 2015-05-09: 1000 mg via INTRAVENOUS
  Filled 2015-05-09: qty 10

## 2015-05-09 MED ORDER — ZOLPIDEM TARTRATE 5 MG PO TABS: 5.0000 mg | ORAL_TABLET | Freq: Every evening | ORAL | Status: DC | PRN

## 2015-05-09 MED ORDER — METHOCARBAMOL 500 MG PO TABS
500.0000 mg | ORAL_TABLET | Freq: Four times a day (QID) | ORAL | Status: DC | PRN
  Administered 2015-05-10 – 2015-05-11 (×4): 500 mg via ORAL
  Filled 2015-05-09 (×4): qty 1

## 2015-05-09 MED ORDER — DOCUSATE SODIUM 100 MG PO CAPS
100.0000 mg | ORAL_CAPSULE | Freq: Two times a day (BID) | ORAL | Status: DC
  Administered 2015-05-09 – 2015-05-11 (×4): 100 mg via ORAL
  Filled 2015-05-09 (×4): qty 1

## 2015-05-09 MED ORDER — ASPIRIN EC 325 MG PO TBEC
325.0000 mg | DELAYED_RELEASE_TABLET | Freq: Two times a day (BID) | ORAL | Status: DC
  Administered 2015-05-09 – 2015-05-11 (×4): 325 mg via ORAL
  Filled 2015-05-09 (×4): qty 1

## 2015-05-09 MED ORDER — CHLORHEXIDINE GLUCONATE 4 % EX LIQD: 60.0000 mL | Freq: Once | CUTANEOUS | Status: DC

## 2015-05-09 MED ORDER — FLUTICASONE PROPIONATE 50 MCG/ACT NA SUSP
2.0000 | Freq: Every day | NASAL | Status: DC
  Administered 2015-05-09 – 2015-05-11 (×2): 2 via NASAL
  Filled 2015-05-09: qty 16

## 2015-05-09 MED ORDER — OXYCODONE-ACETAMINOPHEN 5-325 MG PO TABS
1.0000 | ORAL_TABLET | ORAL | Status: DC | PRN
  Administered 2015-05-09: 2 via ORAL
  Administered 2015-05-09: 1 via ORAL
  Administered 2015-05-09 – 2015-05-11 (×6): 2 via ORAL
  Filled 2015-05-09 (×8): qty 2

## 2015-05-09 MED ORDER — ONDANSETRON HCL 4 MG/2ML IJ SOLN
INTRAMUSCULAR | Status: DC | PRN
  Administered 2015-05-09: 4 mg via INTRAVENOUS

## 2015-05-09 MED ORDER — SODIUM CHLORIDE 0.9 % IV SOLN
INTRAVENOUS | Status: DC
  Administered 2015-05-09: 75 mL/h via INTRAVENOUS
  Administered 2015-05-10: 16:00:00 via INTRAVENOUS

## 2015-05-09 MED ORDER — FENTANYL CITRATE (PF) 100 MCG/2ML IJ SOLN
INTRAMUSCULAR | Status: DC | PRN
  Administered 2015-05-09: 100 ug via INTRAVENOUS
  Administered 2015-05-09 (×2): 50 ug via INTRAVENOUS
  Administered 2015-05-09: 100 ug via INTRAVENOUS

## 2015-05-09 MED ORDER — MENTHOL 3 MG MT LOZG: 1.0000 | LOZENGE | OROMUCOSAL | Status: DC | PRN

## 2015-05-09 MED ORDER — ACETAMINOPHEN 650 MG RE SUPP: 650.0000 mg | Freq: Four times a day (QID) | RECTAL | Status: DC | PRN

## 2015-05-09 MED ORDER — POLYETHYLENE GLYCOL 3350 17 G PO PACK: 17.0000 g | PACK | Freq: Every day | ORAL | Status: DC | PRN

## 2015-05-09 MED ORDER — GLYCOPYRROLATE 0.2 MG/ML IJ SOLN
INTRAMUSCULAR | Status: DC | PRN
  Administered 2015-05-09: 0.4 mg via INTRAVENOUS

## 2015-05-09 MED ORDER — MIDAZOLAM HCL 5 MG/5ML IJ SOLN
INTRAMUSCULAR | Status: DC | PRN
  Administered 2015-05-09: 2 mg via INTRAVENOUS

## 2015-05-09 MED ORDER — UMECLIDINIUM-VILANTEROL 62.5-25 MCG/INH IN AEPB: 1.0000 | INHALATION_SPRAY | Freq: Every day | RESPIRATORY_TRACT | Status: DC

## 2015-05-09 MED ORDER — DULOXETINE HCL 60 MG PO CPEP
60.0000 mg | ORAL_CAPSULE | Freq: Two times a day (BID) | ORAL | Status: DC
  Administered 2015-05-09 – 2015-05-11 (×4): 60 mg via ORAL
  Filled 2015-05-09 (×4): qty 1

## 2015-05-09 MED ORDER — FENTANYL CITRATE (PF) 250 MCG/5ML IJ SOLN
INTRAMUSCULAR | Status: AC
  Filled 2015-05-09: qty 5

## 2015-05-09 MED ORDER — OXYCODONE-ACETAMINOPHEN 5-325 MG PO TABS
ORAL_TABLET | ORAL | Status: AC
  Filled 2015-05-09: qty 1

## 2015-05-09 MED ORDER — ROCURONIUM BROMIDE 50 MG/5ML IV SOLN
INTRAVENOUS | Status: AC
  Filled 2015-05-09: qty 1

## 2015-05-09 MED ORDER — MEPERIDINE HCL 25 MG/ML IJ SOLN: 6.2500 mg | INTRAMUSCULAR | Status: DC | PRN

## 2015-05-09 MED ORDER — GLYCOPYRROLATE 0.2 MG/ML IJ SOLN
INTRAMUSCULAR | Status: AC
  Filled 2015-05-09: qty 1

## 2015-05-09 MED ORDER — LACTATED RINGERS IV SOLN
INTRAVENOUS | Status: DC | PRN
  Administered 2015-05-09: 07:00:00 via INTRAVENOUS

## 2015-05-09 MED ORDER — METHOCARBAMOL 1000 MG/10ML IJ SOLN: 500.0000 mg | Freq: Four times a day (QID) | INTRAVENOUS | Status: DC | PRN

## 2015-05-09 MED ORDER — LIDOCAINE HCL (CARDIAC) 20 MG/ML IV SOLN
INTRAVENOUS | Status: AC
  Filled 2015-05-09: qty 5

## 2015-05-09 MED ORDER — NEOSTIGMINE METHYLSULFATE 10 MG/10ML IV SOLN
INTRAVENOUS | Status: AC
  Filled 2015-05-09: qty 1

## 2015-05-09 MED ORDER — CLOTRIMAZOLE 1 % EX CREA: 1.0000 "application " | TOPICAL_CREAM | Freq: Every day | CUTANEOUS | Status: DC | PRN

## 2015-05-09 MED ORDER — MIDAZOLAM HCL 2 MG/2ML IJ SOLN
INTRAMUSCULAR | Status: AC
  Filled 2015-05-09: qty 2

## 2015-05-09 MED ORDER — ONDANSETRON HCL 4 MG/2ML IJ SOLN
4.0000 mg | Freq: Four times a day (QID) | INTRAMUSCULAR | Status: DC | PRN
Start: 2015-05-09 — End: 2015-05-11

## 2015-05-09 MED ORDER — SODIUM CHLORIDE 0.9 % IV SOLN
10.0000 mg | INTRAVENOUS | Status: DC | PRN
  Administered 2015-05-09: 10 ug/min via INTRAVENOUS

## 2015-05-09 MED ORDER — SODIUM CHLORIDE 0.9 % IR SOLN
Status: DC | PRN
  Administered 2015-05-09: 1000 mL

## 2015-05-09 MED ORDER — ROCURONIUM BROMIDE 100 MG/10ML IV SOLN
INTRAVENOUS | Status: DC | PRN
  Administered 2015-05-09: 50 mg via INTRAVENOUS

## 2015-05-09 MED ORDER — MAGNESIUM CITRATE PO SOLN: 1.0000 | Freq: Once | ORAL | Status: AC | PRN

## 2015-05-09 MED ORDER — DIPHENHYDRAMINE HCL 12.5 MG/5ML PO ELIX
12.5000 mg | ORAL_SOLUTION | ORAL | Status: DC | PRN
Start: 2015-05-09 — End: 2015-05-11

## 2015-05-09 MED ORDER — DEXTROSE 5 % IV SOLN
500.0000 mg | INTRAVENOUS | Status: AC
  Administered 2015-05-09: 500 mg via INTRAVENOUS
  Filled 2015-05-09: qty 5

## 2015-05-09 MED ORDER — ONDANSETRON HCL 4 MG/2ML IJ SOLN: 4.0000 mg | Freq: Once | INTRAMUSCULAR | Status: DC | PRN

## 2015-05-09 MED ORDER — ALBUTEROL SULFATE HFA 108 (90 BASE) MCG/ACT IN AERS: 2.0000 | INHALATION_SPRAY | RESPIRATORY_TRACT | Status: DC | PRN

## 2015-05-09 MED ORDER — EPHEDRINE SULFATE 50 MG/ML IJ SOLN: INTRAMUSCULAR | Status: DC | PRN

## 2015-05-09 MED ORDER — METOCLOPRAMIDE HCL 5 MG PO TABS
5.0000 mg | ORAL_TABLET | Freq: Three times a day (TID) | ORAL | Status: DC | PRN
Start: 2015-05-09 — End: 2015-05-11

## 2015-05-09 MED ORDER — KETOROLAC TROMETHAMINE 15 MG/ML IJ SOLN
15.0000 mg | Freq: Four times a day (QID) | INTRAMUSCULAR | Status: AC
  Administered 2015-05-09 – 2015-05-10 (×4): 15 mg via INTRAVENOUS
  Filled 2015-05-09 (×4): qty 1

## 2015-05-09 MED ORDER — PHENOL 1.4 % MT LIQD: 1.0000 | OROMUCOSAL | Status: DC | PRN

## 2015-05-09 MED ORDER — ONDANSETRON HCL 4 MG PO TABS: 4.0000 mg | ORAL_TABLET | Freq: Four times a day (QID) | ORAL | Status: DC | PRN

## 2015-05-09 MED ORDER — SUGAMMADEX SODIUM 200 MG/2ML IV SOLN
INTRAVENOUS | Status: AC
  Filled 2015-05-09: qty 2

## 2015-05-09 MED ORDER — HYDROMORPHONE HCL 1 MG/ML IJ SOLN
1.0000 mg | INTRAMUSCULAR | Status: DC | PRN
  Administered 2015-05-09: 1 mg via INTRAVENOUS
  Administered 2015-05-10: 2 mg via INTRAVENOUS
  Administered 2015-05-10: 1 mg via INTRAVENOUS
  Administered 2015-05-10 – 2015-05-11 (×2): 2 mg via INTRAVENOUS
  Filled 2015-05-09: qty 2
  Filled 2015-05-09 (×2): qty 1
  Filled 2015-05-09 (×2): qty 2

## 2015-05-09 MED ORDER — BISACODYL 10 MG RE SUPP: 10.0000 mg | Freq: Every day | RECTAL | Status: DC | PRN

## 2015-05-09 MED ORDER — PROPOFOL 10 MG/ML IV BOLUS
INTRAVENOUS | Status: DC | PRN
  Administered 2015-05-09: 200 mg via INTRAVENOUS

## 2015-05-09 MED ORDER — METOCLOPRAMIDE HCL 5 MG/ML IJ SOLN: 5.0000 mg | Freq: Three times a day (TID) | INTRAMUSCULAR | Status: DC | PRN

## 2015-05-09 MED ORDER — ALUM & MAG HYDROXIDE-SIMETH 200-200-20 MG/5ML PO SUSP: 30.0000 mL | ORAL | Status: DC | PRN

## 2015-05-09 MED ORDER — PROPOFOL 10 MG/ML IV BOLUS
INTRAVENOUS | Status: AC
  Filled 2015-05-09: qty 20

## 2015-05-09 MED ORDER — ALBUTEROL SULFATE (2.5 MG/3ML) 0.083% IN NEBU: 2.5000 mg | INHALATION_SOLUTION | Freq: Four times a day (QID) | RESPIRATORY_TRACT | Status: DC | PRN

## 2015-05-09 SURGICAL SUPPLY — 69 items
APL SKNCLS STERI-STRIP NONHPOA (GAUZE/BANDAGES/DRESSINGS) ×1
BENZOIN TINCTURE PRP APPL 2/3 (GAUZE/BANDAGES/DRESSINGS) ×2 IMPLANT
BLADE SAW SAG 73X25 THK (BLADE) ×1
BLADE SAW SGTL 73X25 THK (BLADE) ×1 IMPLANT
BLADE SURG ROTATE 9660 (MISCELLANEOUS) IMPLANT
BOWL SMART MIX CTS (DISPOSABLE) IMPLANT
CAPT SHLDR TOTAL 2 ×2 IMPLANT
CEMENT BONE DEPUY (Cement) ×2 IMPLANT
COVER SURGICAL LIGHT HANDLE (MISCELLANEOUS) ×2 IMPLANT
DRAPE C-ARM 42X72 X-RAY (DRAPES) IMPLANT
DRAPE IMP U-DRAPE 54X76 (DRAPES) ×2 IMPLANT
DRAPE INCISE IOBAN 66X45 STRL (DRAPES) IMPLANT
DRAPE U-SHAPE 47X51 STRL (DRAPES) ×4 IMPLANT
DRILL BIT 7/64X5 (BIT) IMPLANT
DRSG MEPILEX BORDER 4X12 (GAUZE/BANDAGES/DRESSINGS) ×2 IMPLANT
DRSG PAD ABDOMINAL 8X10 ST (GAUZE/BANDAGES/DRESSINGS) IMPLANT
DURAPREP 26ML APPLICATOR (WOUND CARE) ×2 IMPLANT
ELECT BLADE 4.0 EZ CLEAN MEGAD (MISCELLANEOUS) ×2
ELECT CAUTERY BLADE 6.4 (BLADE) ×2 IMPLANT
ELECT NEEDLE TIP 2.8 STRL (NEEDLE) IMPLANT
ELECT REM PT RETURN 9FT ADLT (ELECTROSURGICAL) ×2
ELECTRODE BLDE 4.0 EZ CLN MEGD (MISCELLANEOUS) ×1 IMPLANT
ELECTRODE REM PT RTRN 9FT ADLT (ELECTROSURGICAL) ×1 IMPLANT
EVACUATOR 1/8 PVC DRAIN (DRAIN) IMPLANT
GAUZE XEROFORM 5X9 LF (GAUZE/BANDAGES/DRESSINGS) IMPLANT
GLOVE BIOGEL PI IND STRL 8 (GLOVE) ×2 IMPLANT
GLOVE BIOGEL PI INDICATOR 8 (GLOVE) ×2
GLOVE ECLIPSE 7.5 STRL STRAW (GLOVE) ×4 IMPLANT
GOWN STRL REUS W/ TWL LRG LVL3 (GOWN DISPOSABLE) ×1 IMPLANT
GOWN STRL REUS W/TWL LRG LVL3 (GOWN DISPOSABLE) ×1
HANDPIECE INTERPULSE COAX TIP (DISPOSABLE)
HOOD PEEL AWAY FACE SHEILD DIS (HOOD) ×4 IMPLANT
KIT BASIN OR (CUSTOM PROCEDURE TRAY) ×2 IMPLANT
KIT ROOM TURNOVER OR (KITS) ×2 IMPLANT
MANIFOLD NEPTUNE II (INSTRUMENTS) ×2 IMPLANT
NEEDLE 22X1 1/2 (OR ONLY) (NEEDLE) IMPLANT
NOZZLE PRISM 8.5MM (MISCELLANEOUS) IMPLANT
NS IRRIG 1000ML POUR BTL (IV SOLUTION) ×2 IMPLANT
PACK SHOULDER (CUSTOM PROCEDURE TRAY) ×2 IMPLANT
PACK UNIVERSAL I (CUSTOM PROCEDURE TRAY) ×2 IMPLANT
PAD ARMBOARD 7.5X6 YLW CONV (MISCELLANEOUS) ×4 IMPLANT
PENCIL BUTTON HOLSTER BLD 10FT (ELECTRODE) ×2 IMPLANT
PIN METAGLENE 2.5 (PIN) ×2 IMPLANT
PRESSURIZER FEMORAL UNIV (MISCELLANEOUS) IMPLANT
SET HNDPC FAN SPRY TIP SCT (DISPOSABLE) IMPLANT
SLING ARM FOAM STRAP LRG (SOFTGOODS) ×2 IMPLANT
SLING ARM IMMOBILIZER LRG (SOFTGOODS) IMPLANT
SLING ARM IMMOBILIZER MED (SOFTGOODS) IMPLANT
SMARTMIX MINI TOWER (MISCELLANEOUS) ×2
SPONGE LAP 18X18 X RAY DECT (DISPOSABLE) ×2 IMPLANT
SPONGE LAP 4X18 X RAY DECT (DISPOSABLE) ×2 IMPLANT
STAPLER VISISTAT 35W (STAPLE) IMPLANT
STRIP CLOSURE SKIN 1/2X4 (GAUZE/BANDAGES/DRESSINGS) ×2 IMPLANT
SUCTION FRAZIER TIP 10 FR DISP (SUCTIONS) ×2 IMPLANT
SUPPORT WRAP ARM LG (MISCELLANEOUS) ×2 IMPLANT
SUT FIBERWIRE #2 38 T-5 BLUE (SUTURE) ×12
SUT MNCRL AB 3-0 PS2 18 (SUTURE) ×2 IMPLANT
SUT VIC AB 0 CT1 27 (SUTURE) ×1
SUT VIC AB 0 CT1 27XBRD ANBCTR (SUTURE) ×1 IMPLANT
SUT VIC AB 2-0 CT1 27 (SUTURE) ×2
SUT VIC AB 2-0 CT1 TAPERPNT 27 (SUTURE) ×2 IMPLANT
SUTURE FIBERWR #2 38 T-5 BLUE (SUTURE) ×6 IMPLANT
SYR CONTROL 10ML LL (SYRINGE) IMPLANT
TOWEL OR 17X24 6PK STRL BLUE (TOWEL DISPOSABLE) ×2 IMPLANT
TOWEL OR 17X26 10 PK STRL BLUE (TOWEL DISPOSABLE) ×2 IMPLANT
TOWER CARTRIDGE SMART MIX (DISPOSABLE) IMPLANT
TOWER SMARTMIX MINI (MISCELLANEOUS) ×1 IMPLANT
TRAY FOLEY CATH 16FRSI W/METER (SET/KITS/TRAYS/PACK) IMPLANT
WATER STERILE IRR 1000ML POUR (IV SOLUTION) ×2 IMPLANT

## 2015-05-09 NOTE — H&P (Signed)
TOTAL KNEE ADMISSION H&P  Patient is being admitted for left total shoulder arthroplasty.  Subjective:  Chief Complaint:left shoulder pain.  HPI: [name], 49 y.o. female, has a history of pain and functional disability in the left shoulder due to arthritis and has failed non-surgical conservative treatments for greater than 12 weeks to includeNSAID's and/or analgesics, corticosteriod injections, flexibility and strengthening excercises and supervised PT with diminished ADL's post treatment.  Onset of symptoms was gradual, starting 5 years ago with gradually worsening course since that time. The patient noted prior procedures on the knee to include  arthroscopy on the left shoulder.  Patient currently rates pain in the left shoulder at 8 out of 10 with activity. Patient has night pain, pain that interferes with activities of daily living, pain with passive range of motion, crepitus and joint swelling.  Patient has evidence of subchondral cysts, subchondral sclerosis, periarticular osteophytes and joint space narrowing by imaging studies. This patient has had failuer of all conservative care. There is no active infection.  Patient Active Problem List   Diagnosis Date Noted  . TOBACCO ABUSE 06/20/2008  . CONSTIPATION 06/18/2008  . HEART MURMUR, BENIGN 06/18/2008  . OTHER RESPIRATORY COMPLICATIONS 16/08/9603   Past Medical History  Diagnosis Date  . COPD (chronic obstructive pulmonary disease)   . Arthritis   . Fibromyalgia     Past Surgical History  Procedure Laterality Date  . Shoulder surgery      x 2 on right shoulder  . Left hand      1 for ganglion cyst and next yr replace "joints" in left hand  . Laparoscopic salpingoopherectomy    . Knee cartilage surgery      left  . Plantar fascia surgery      left foot  . Fracture surgery      left foot   . Tonsillectomy      Prescriptions prior to admission  Medication Sig Dispense Refill Last Dose  . b complex vitamins tablet  Take 1 tablet by mouth daily.   05/04/2015  . clotrimazole (LOTRIMIN) 1 % cream Apply 1 application topically daily.     . DULoxetine (CYMBALTA) 60 MG capsule Take 60 mg by mouth 2 (two) times daily.     . fluticasone (FLONASE) 50 MCG/ACT nasal spray Place 2 sprays into both nostrils daily.   05/08/2015 at Unknown time  . ibuprofen (ADVIL,MOTRIN) 800 MG tablet Take 800 mg by mouth every 6 (six) hours as needed (pain).   05/08/2015 at Unknown time  . Multiple Vitamin (MULTIVITAMIN WITH MINERALS) TABS tablet Take 1 tablet by mouth daily.   05/04/2015  . oxyCODONE-acetaminophen (PERCOCET/ROXICET) 5-325 MG per tablet Take 1-2 tablets by mouth every 4 (four) hours as needed (pain).   05/09/2015 at 0400  . oxymorphone (OPANA ER) 10 MG T12A 12 hr tablet Take 10 mg by mouth 2 (two) times daily.   05/06/2015  . pregabalin (LYRICA) 100 MG capsule Take 300 mg by mouth at bedtime.     Marland Kitchen Umeclidinium-Vilanterol (ANORO ELLIPTA) 62.5-25 MCG/INH AEPB Inhale 1 puff into the lungs daily.   05/07/2015  . albuterol (PROVENTIL HFA;VENTOLIN HFA) 108 (90 BASE) MCG/ACT inhaler Inhale 2 puffs into the lungs every 4 (four) hours as needed for wheezing or shortness of breath. ProAir   More than a month at Unknown time  . diphenhydrAMINE (BENADRYL) 12.5 MG/5ML liquid Take 225 mg by mouth once as needed (allergic reaction to bees or spiders).   More than a month at  Unknown time   Allergies  Allergen Reactions  . Augmentin [Amoxicillin-Pot Clavulanate] Anaphylaxis  . Bee Venom Hives and Shortness Of Breath    Treats with benadryl  . Other Hives and Shortness Of Breath    Reaction to spider bites (treats with benadryl)  . Sulfa Antibiotics Anaphylaxis and Other (See Comments)    Childhood allergic reaction Sulfonamides    History  Substance Use Topics  . Smoking status: Current Every Day Smoker -- 0.50 packs/day for 30 years  . Smokeless tobacco: Not on file  . Alcohol Use: Yes     Comment: monthly    No family history on  file.   ROS ROS: I have reviewed the patient's review of systems thoroughly and there are no positive responses as relates to the HPI.  Objective:  Physical Exam  Vital signs in last 24 hours: Temp:  [98.4 F (36.9 C)] 98.4 F (36.9 C) (07/01 0603) Pulse Rate:  [93] 93 (07/01 0603) Resp:  [20] 20 (07/01 0603) BP: (176)/(81) 176/81 mmHg (07/01 0603) SpO2:  [97 %] 97 % (07/01 0603) Weight:  [225 lb 12 oz (102.4 kg)] 225 lb 12 oz (102.4 kg) (07/01 0603) Well-developed well-nourished patient in no acute distress. Alert and oriented x3 HEENT:within normal limits Cardiac: Regular rate and rhythm Pulmonary: Lungs clear to auscultation Abdomen: Soft and nontender.  Normal active bowel sounds  Musculoskeletal: l shoulder : painful rom.  Limited rom.  Good er strength Labs: Recent Results (from the past 2160 hour(s))  APTT     Status: None   Collection Time: 04/29/15  4:01 PM  Result Value Ref Range   aPTT 32 24 - 37 seconds  CBC WITH DIFFERENTIAL     Status: Abnormal   Collection Time: 04/29/15  4:01 PM  Result Value Ref Range   WBC 13.6 (H) 4.0 - 10.5 K/uL   RBC 4.49 3.87 - 5.11 MIL/uL   Hemoglobin 13.9 12.0 - 15.0 g/dL   HCT 41.6 36.0 - 46.0 %   MCV 92.7 78.0 - 100.0 fL   MCH 31.0 26.0 - 34.0 pg   MCHC 33.4 30.0 - 36.0 g/dL   RDW 13.0 11.5 - 15.5 %   Platelets 382 150 - 400 K/uL   Neutrophils Relative % 57 43 - 77 %   Lymphocytes Relative 33 12 - 46 %   Monocytes Relative 7 3 - 12 %   Eosinophils Relative 3 0 - 5 %   Basophils Relative 0 0 - 1 %   Neutro Abs 7.7 1.7 - 7.7 K/uL   Lymphs Abs 4.5 (H) 0.7 - 4.0 K/uL   Monocytes Absolute 1.0 0.1 - 1.0 K/uL   Eosinophils Absolute 0.4 0.0 - 0.7 K/uL   Basophils Absolute 0.0 0.0 - 0.1 K/uL   RBC Morphology POLYCHROMASIA PRESENT    WBC Morphology ATYPICAL LYMPHOCYTES   Comprehensive metabolic panel     Status: Abnormal   Collection Time: 04/29/15  4:01 PM  Result Value Ref Range   Sodium 140 135 - 145 mmol/L   Potassium  4.1 3.5 - 5.1 mmol/L   Chloride 101 101 - 111 mmol/L   CO2 28 22 - 32 mmol/L   Glucose, Bld 97 65 - 99 mg/dL   BUN 16 6 - 20 mg/dL   Creatinine, Ser 0.60 0.44 - 1.00 mg/dL   Calcium 9.3 8.9 - 10.3 mg/dL   Total Protein 7.1 6.5 - 8.1 g/dL   Albumin 3.8 3.5 - 5.0 g/dL   AST 14 (  L) 15 - 41 U/L   ALT 17 14 - 54 U/L   Alkaline Phosphatase 105 38 - 126 U/L   Total Bilirubin 0.3 0.3 - 1.2 mg/dL   GFR calc non Af Amer >60 >60 mL/min   GFR calc Af Amer >60 >60 mL/min    Comment: (NOTE) The eGFR has been calculated using the CKD EPI equation. This calculation has not been validated in all clinical situations. eGFR's persistently <60 mL/min signify possible Chronic Kidney Disease.    Anion gap 11 5 - 15  Protime-INR     Status: None   Collection Time: 04/29/15  4:01 PM  Result Value Ref Range   Prothrombin Time 14.5 11.6 - 15.2 seconds   INR 1.11 0.00 - 1.49  Urinalysis, Routine w reflex microscopic     Status: None   Collection Time: 04/29/15  4:01 PM  Result Value Ref Range   Color, Urine YELLOW YELLOW   APPearance CLEAR CLEAR   Specific Gravity, Urine 1.025 1.005 - 1.030   pH 5.5 5.0 - 8.0   Glucose, UA NEGATIVE NEGATIVE mg/dL   Hgb urine dipstick NEGATIVE NEGATIVE   Bilirubin Urine NEGATIVE NEGATIVE   Ketones, ur NEGATIVE NEGATIVE mg/dL   Protein, ur NEGATIVE NEGATIVE mg/dL   Urobilinogen, UA 0.2 0.0 - 1.0 mg/dL   Nitrite NEGATIVE NEGATIVE   Leukocytes, UA NEGATIVE NEGATIVE    Comment: MICROSCOPIC NOT DONE ON URINES WITH NEGATIVE PROTEIN, BLOOD, LEUKOCYTES, NITRITE, OR GLUCOSE <1000 mg/dL.  Surgical pcr screen     Status: Abnormal   Collection Time: 04/29/15  4:02 PM  Result Value Ref Range   MRSA, PCR NEGATIVE NEGATIVE   Staphylococcus aureus POSITIVE (A) NEGATIVE    Comment:        The Xpert SA Assay (FDA approved for NASAL specimens in patients over 59 years of age), is one component of a comprehensive surveillance program.  Test performance has been validated by  [facility] for patients greater than or equal to 84 year old. It is not intended to diagnose infection nor to guide or monitor treatment.   Type and screen     Status: None   Collection Time: 04/29/15  4:15 PM  Result Value Ref Range   ABO/RH(D) O POS    Antibody Screen NEG    Sample Expiration 05/13/2015   ABO/Rh     Status: None   Collection Time: 04/29/15  4:15 PM  Result Value Ref Range   ABO/RH(D) O POS      Estimated body mass index is 41.28 kg/(m^2) as calculated from the following:   Height as of this encounter: $RemoveBeforeD'5\' 2"'[id number]$  (1.575 m).   Weight as of this encounter: 225 lb 12 oz (102.4 kg).   Imaging Review Plain radiographs demonstrate severe degenerative joint disease of the left shoulder. The overall alignment isneutral. The bone quality appears to be fair for age and reported activity level.  Assessment/Plan:  End stage arthritis, left shoulder   The patient history, physical examination, clinical judgment of the provider and imaging studies are consistent with end stage degenerative joint disease of the left shoulder and total shoulder arthroplasty is deemed medically necessary. The treatment options including medical management, injection therapy arthroscopy and arthroplasty were discussed at length. The risks and benefits of total shoulder arthroplasty were presented and reviewed. The risks due to aseptic loosening, infection, stiffness, thromboembolic complications and other imponderables were discussed. The patient acknowledged the explanation, agreed to proceed with the plan and consent was signed.  Patient is being admitted for inpatient treatment for surgery, pain control, PT, OT, prophylactic antibiotics, VTE prophylaxis, progressive ambulation and ADL's and discharge planning. The patient is planning to be discharged home with home health services

## 2015-05-09 NOTE — Anesthesia Preprocedure Evaluation (Addendum)
Anesthesia Evaluation  Patient identified by MRN, date of birth, ID band Patient awake    Reviewed: Allergy & Precautions, NPO status , Patient's Chart, lab work & pertinent test results  Airway Mallampati: II  TM Distance: >3 FB Neck ROM: Full    Dental  (+) Teeth Intact   Pulmonary Current Smoker,    Pulmonary exam normal       Cardiovascular Normal cardiovascular exam    Neuro/Psych    GI/Hepatic   Endo/Other    Renal/GU      Musculoskeletal   Abdominal   Peds  Hematology   Anesthesia Other Findings   Reproductive/Obstetrics                            Anesthesia Physical Anesthesia Plan  ASA: III  Anesthesia Plan: General   Post-op Pain Management: MAC Combined w/ Regional for Post-op pain   Induction: Intravenous  Airway Management Planned: Oral ETT  Additional Equipment:   Intra-op Plan:   Post-operative Plan: Extubation in OR  Informed Consent: I have reviewed the patients History and Physical, chart, labs and discussed the procedure including the risks, benefits and alternatives for the proposed anesthesia with the patient or authorized representative who has indicated his/her understanding and acceptance.   Dental advisory given  Plan Discussed with: CRNA  Anesthesia Plan Comments:        Anesthesia Quick Evaluation

## 2015-05-09 NOTE — Anesthesia Postprocedure Evaluation (Signed)
  Anesthesia Post-op Note  Patient: [name]  Procedure(s) Performed: Procedure(s): TOTAL SHOULDER ARTHROPLASTY (Left)  Patient Location: PACU  Anesthesia Type:GA combined with regional for post-op pain  Level of Consciousness: awake  Airway and Oxygen Therapy: Patient Spontanous Breathing and Patient connected to nasal cannula oxygen  Post-op Pain: mild  Post-op Assessment: Post-op Vital signs reviewed, Patient's Cardiovascular Status Stable, Respiratory Function Stable, Patent Airway, No signs of Nausea or vomiting and Pain level controlled              Post-op Vital Signs: Reviewed and stable  Last Vitals:  Filed Vitals:   05/09/15 1300  BP: 128/66  Pulse: 103  Temp: 37 C  Resp: 22    Complications: No apparent anesthesia complications

## 2015-05-09 NOTE — Anesthesia Procedure Notes (Addendum)
Procedure Name: Intubation Date/Time: 05/09/2015 7:35 AM Performed by: Kyung Rudd Pre-anesthesia Checklist: Patient identified, Emergency Drugs available, Suction available, Patient being monitored and Timeout performed Patient Re-evaluated:Patient Re-evaluated prior to inductionOxygen Delivery Method: Circle system utilized Preoxygenation: Pre-oxygenation with 100% oxygen Intubation Type: IV induction Ventilation: Mask ventilation without difficulty Laryngoscope Size: Mac and 3 Grade View: Grade I Tube type: Oral Tube size: 7.0 mm Number of attempts: 1 Airway Equipment and Method: Stylet Placement Confirmation: ETT inserted through vocal cords under direct vision,  positive ETCO2 and breath sounds checked- equal and bilateral Secured at: 21 cm Tube secured with: Tape Dental Injury: Teeth and Oropharynx as per pre-operative assessment    Anesthesia Regional Block:  Interscalene brachial plexus block  Pre-Anesthetic Checklist: ,, timeout performed, Correct Patient, Correct Site, Correct Laterality, Correct Procedure, Correct Position, site marked, Risks and benefits discussed,  Surgical consent,  Pre-op evaluation,  At surgeon's request and post-op pain management  Laterality: Right  Prep: chloraprep       Needles:  Injection technique: Single-shot  Needle Type: Echogenic Stimulator Needle     Needle Length: 9cm 9 cm Needle Gauge: 21 and 21 G    Additional Needles:  Procedures: ultrasound guided (picture in chart) and nerve stimulator Interscalene brachial plexus block  Nerve Stimulator or Paresthesia:  Response: 0.4 mA,   Additional Responses:   Narrative:  Start time: 05/09/2015 7:05 AM End time: 05/09/2015 7:15 AM Injection made incrementally with aspirations every 5 mL.  Performed by: Personally  Anesthesiologist: Lillia Abed  Additional Notes: Monitors applied. Patient sedated. Sterile prep and drape,hand hygiene and sterile gloves were used. Relevant  anatomy identified.Needle position confirmed.Local anesthetic injected incrementally after negative aspiration. Local anesthetic spread visualized around nerve(s). Vascular puncture avoided. No complications. Image printed for medical record.The patient tolerated the procedure well.

## 2015-05-09 NOTE — Care Management (Signed)
Utilization review completed. Medea Deines, RN Case Manager [phone]. 

## 2015-05-09 NOTE — Brief Op Note (Signed)
05/09/2015  10:14 AM  PATIENT:  Genoveva Ill Broden  49 y.o. female  PRE-OPERATIVE DIAGNOSIS:  ENDSTAGE DEGENERATIVE JOINT DISEASE left shoulder  POST-OPERATIVE DIAGNOSIS:  ENDSTAGE DEGENERATIVE JOINT DISEASE left shoulder  PROCEDURE:  Procedure(s): TOTAL SHOULDER ARTHROPLASTY (Left)  SURGEON:  Surgeon(s) and Role:    * Dorna Leitz, MD - Primary  PHYSICIAN ASSISTANT:   ASSISTANTS: blair roberts PA   ANESTHESIA:   general  EBL:  Total I/O In: 600 [I.V.:600] Out: 100 [Blood:100]  BLOOD ADMINISTERED:none  DRAINS: none   LOCAL MEDICATIONS USED:  NONE  SPECIMEN:  No Specimen  DISPOSITION OF SPECIMEN:  N/A  COUNTS:  YES  TOURNIQUET:  * No tourniquets in log *  DICTATION: .Other Dictation: Dictation Number [id number]  PLAN OF CARE: Admit to inpatient   PATIENT DISPOSITION:  PACU - hemodynamically stable.   Delay start of Pharmacological VTE agent (>24hrs) due to surgical blood loss or risk of bleeding: no

## 2015-05-09 NOTE — Transfer of Care (Signed)
Immediate Anesthesia Transfer of Care Note  Patient: [name]  Procedure(s) Performed: Procedure(s): TOTAL SHOULDER ARTHROPLASTY (Left)  Patient Location: PACU  Anesthesia Type:General  Level of Consciousness: awake, alert  and oriented  Airway & Oxygen Therapy: Patient Spontanous Breathing and Patient connected to nasal cannula oxygen  Post-op Assessment: Report given to RN, Post -op Vital signs reviewed and stable and Patient moving all extremities  Post vital signs: Reviewed and stable  Last Vitals:  Filed Vitals:   05/09/15 0603  BP: 176/81  Pulse: 93  Temp: 36.9 C  Resp: 20    Complications: No apparent anesthesia complications

## 2015-05-10 LAB — BASIC METABOLIC PANEL
Anion gap: 7 (ref 5–15)
BUN: 8 mg/dL (ref 6–20)
CO2: 27 mmol/L (ref 22–32)
Calcium: 8.5 mg/dL — ABNORMAL LOW (ref 8.9–10.3)
Chloride: 103 mmol/L (ref 101–111)
Creatinine, Ser: 0.5 mg/dL (ref 0.44–1.00)
GFR calc Af Amer: >60 mL/min (ref >60)
GFR calc non Af Amer: >60 mL/min (ref >60)
Glucose, Bld: 105 mg/dL — ABNORMAL HIGH (ref 65–99)
Potassium: 3.9 mmol/L (ref 3.5–5.1)
Sodium: 137 mmol/L (ref 135–145)

## 2015-05-10 LAB — CBC
HCT: 38.3 % (ref 36.0–46.0)
Hemoglobin: 12.5 g/dL (ref 12.0–15.0)
MCH: 30.8 pg (ref 26.0–34.0)
MCHC: 32.6 g/dL (ref 30.0–36.0)
MCV: 94.3 fL (ref 78.0–100.0)
Platelets: 297 10*3/uL (ref 150–400)
RBC: 4.06 MIL/uL (ref 3.87–5.11)
RDW: 13.3 % (ref 11.5–15.5)
WBC: 12.8 10*3/uL — ABNORMAL HIGH (ref 4.0–10.5)

## 2015-05-10 NOTE — Progress Notes (Signed)
PATIENT ID: [name]  MRN: [id number]  DOB/AGE:  50-Jul-1967 / 50 y.o.  1 Day Post-Op Procedure(s) (LRB): TOTAL SHOULDER ARTHROPLASTY (Left)    PROGRESS NOTE Subjective:   Patient is alert, oriented, no Nausea, no Vomiting, yes passing gas, no Bowel Movement. Taking PO well. Denies SOB, Chest or Calf Pain. Using Incentive Spirometer, PAS in place. Ambulate WBAT, Patient reports pain as 4 on 0-10 scale,     Objective: Vital signs in last 24 hours: Temp:  [98 F (36.7 C)-98.6 F (37 C)] 98 F (36.7 C) (07/02 0543) Pulse Rate:  [94-110] 102 (07/02 0543) Resp:  [16-31] 16 (07/02 0510) BP: (119-135)/(65-80) 135/65 mmHg (07/02 0543) SpO2:  [92 %-98 %] 98 % (07/02 0543)    Intake/Output from previous day: I/O last 3 completed shifts: In: 1051.3 [P.O.:360; I.V.:636.3; IV Piggyback:55] Out: 300 [Urine:200; Blood:100]   Intake/Output this shift:     LABORATORY DATA:  Recent Labs  05/10/15 0444  WBC 12.8*  HGB 12.5  HCT 38.3  PLT 297  NA 137  K 3.9  CL 103  CO2 27  BUN 8  CREATININE 0.50  GLUCOSE 105*  CALCIUM 8.5*    Examination: Neurologically intact Neurovascular intact Sensation intact distally Intact pulses distally Incision: dressing C/D/I No cellulitis present Compartment soft}  Assessment:   1 Day Post-Op Procedure(s) (LRB): TOTAL SHOULDER ARTHROPLASTY (Left) ADDITIONAL DIAGNOSIS:    Plan:  Sling, PROM limited to 40ER 140 FF  DVT Prophylaxis:  Aspirin 325 BID  DISCHARGE PLAN: Home when pt passes therapy goals.       Victorio Creeden R 05/10/2015, 8:06 AM

## 2015-05-10 NOTE — Progress Notes (Signed)
Occupational Therapy Evaluation Patient Details Name: [name] MRN: [id number] DOB: Jul 17, 1966 Today's Date: 05/10/2015    History of Present Illness Left TSA   Clinical Impression   Patient presenting with increased pain and decreased ADL, IADL independence secondary to L TSA.  Patient independent PTA. Patient currently functioning at an overall supervision -> min assist level. Patient will benefit from acute OT to increase overall independence in the areas of ADLs, functional mobility, education on shoulder protocol, and overall safety in order to safely discharge home with parents.     Follow Up Recommendations  No OT follow up;Supervision/Assistance - 24 hour    Equipment Recommendations  None recommended by OT    Recommendations for Other Services  None at this time  Precautions / Restrictions Precautions Precautions: Shoulder Type of Shoulder Precautions: NWB, no shoulder A/PROM, Limit FF -> 90*, external rotation -> neutral, AROM->elbow, wrist, hand ok to tolerance Shoulder Interventions: Shoulder sling/immobilizer;Off for dressing/bathing/exercises Restrictions Weight Bearing Restrictions: Yes LUE Weight Bearing: Non weight bearing      Mobility Bed Mobility Overal bed mobility: Independent Transfers Overall transfer level: Independent Equipment used: None   Balance Overall balance assessment: No apparent balance deficits (not formally assessed);Independent    ADL Overall ADL's : Needs assistance/impaired   General ADL Comments: Patient overall supervision -> min assist for ADLs. Pt able to cross BLEs for LB ADLs. Pt requires min assist for UB ADLs due to recent L TSA.      Vision Additional Comments: no change from baseline          Pertinent Vitals/Pain Pain Assessment: 0-10 Pain Score: 9  Pain Location: LUE shoulder Pain Descriptors / Indicators: Aching;Throbbing;Stabbing Pain Intervention(s): Limited activity within patient's  tolerance;Monitored during session;Repositioned;Ice applied     Hand Dominance Right   Extremity/Trunk Assessment Upper Extremity Assessment Upper Extremity Assessment: LUE deficits/detail LUE Deficits / Details: recent L TSA LUE: Unable to fully assess due to immobilization   Lower Extremity Assessment Lower Extremity Assessment: Overall WFL for tasks assessed   Cervical / Trunk Assessment Cervical / Trunk Assessment: Normal   Communication Communication Communication: No difficulties   Cognition Arousal/Alertness: Awake/alert Behavior During Therapy: WFL for tasks assessed/performed Overall Cognitive Status: Within Functional Limits for tasks assessed        Shoulder Instructions Shoulder Instructions Donning/doffing shirt without moving shoulder: Minimal assistance Method for sponge bathing under operated UE: Minimal assistance Donning/doffing sling/immobilizer: Set-up Correct positioning of sling/immobilizer: Supervision/safety Pendulum exercises (written home exercise program):  (n/a) ROM for elbow, wrist and digits of operated UE: Supervision/safety Sling wearing schedule (on at all times/off for ADL's): Supervision/safety Proper positioning of operated UE when showering: Supervision/safety Positioning of UE while sleeping: Supervision/safety    Home Living Family/patient expects to be discharged to:: Private residence Living Arrangements: Alone Available Help at Discharge: Family;Available 24 hours/day Type of Home: House       Home Layout: One level     Bathroom Shower/Tub: Tub/shower unit;Curtain   Bathroom Toilet: Standard     Home Equipment: None   Prior Functioning/Environment Level of Independence: Independent     OT Diagnosis: Generalized weakness;Acute pain   OT Problem List: Decreased strength;Decreased range of motion;Decreased activity tolerance;Decreased knowledge of precautions;Pain;Impaired UE functional use   OT  Treatment/Interventions: Self-care/ADL training;Therapeutic exercise;Energy conservation;Splinting;Therapeutic activities;Patient/family education    OT Goals(Current goals can be found in the care plan section) Acute Rehab OT Goals Patient Stated Goal: decrease pain OT Goal Formulation: With patient Time For Goal Achievement: 05/17/15 Potential  to Achieve Goals: Good ADL Goals Pt Will Perform Upper Body Bathing: with modified independence;sitting Pt Will Perform Upper Body Dressing: with modified independence;sitting Pt Will Transfer to Toilet: with modified independence;ambulating Pt Will Perform Tub/Shower Transfer: Tub transfer;with modified independence;ambulating Pt/caregiver will Perform Home Exercise Program: Increased ROM;Increased strength;Left upper extremity;With written HEP provided;With Supervision Additional ADL Goal #1: Pt will be independent with directing care regarding shoulder care 100% of the time  OT Frequency: Min 3X/week   Barriers to D/C: None known at this time   End of Session Equipment Utilized During Treatment: Other (comment) (sling)  Activity Tolerance: Patient tolerated treatment well Patient left: in chair;with call bell/phone within reach   Time: 0850-0928 OT Time Calculation (min): 38 min Charges:  OT General Charges $OT Visit: 1 Procedure OT Evaluation $Initial OT Evaluation Tier I: 1 Procedure OT Treatments $Self Care/Home Management : 8-22 mins $Therapeutic Exercise: 8-22 mins  Kash Mothershead , MS, OTR/L, CLT Pager: [phone]  05/10/2015, 9:43 AM

## 2015-05-10 NOTE — Op Note (Signed)
NAMEDANELI, [name] NO.:  [id number]  MEDICAL RECORD NO.:  [id number]  LOCATION:  [id number]                        FACILITY:  [facility]  PHYSICIAN:  Alta Corning, M.D.   DATE OF BIRTH:  [birth year]  DATE OF PROCEDURE:  05/09/2015 DATE OF DISCHARGE:                              OPERATIVE REPORT   PREOPERATIVE DIAGNOSIS:  End-stage degenerative joint disease.  POSTOPERATIVE DIAGNOSES: 1. End-stage degenerative joint disease. 2. Impending biceps tendon rupture. 3. Rotator cuff tear.  PROCEDURE: 1. Left total shoulder replacement with a [employer] System, size 10 stem with a 44 mm +18 eccentric head, and a 44 mm anchor PEG glenoid cemented into the small pegs. 2. L rotator cuff repair. 3. L. biceps tenodesis  SURGEON:  Alta Corning, M.D.  ASSISTANT:  Nehemiah Massed, PA-C.  ANESTHESIA:  General.  BRIEF HISTORY:  Ms. [name] is a 49 year old female with a long history of significant complaints of left shoulder pain.  X-rays showed severe end-stage degenerative joint disease of the left shoulder.  She had failed conservative care, increase in activity modification, physical therapy, and after failure of all conservative care, she was taken to the operating room for left total shoulder replacement.  The patient was having night pain and light activity pain prior to the surgical intervention.  DESCRIPTION OF PROCEDURE:  The patient was taken to the operating room. After adequate anesthesia was obtained with an interscalene block and general anesthetic, the patient was placed supine on the operating table and then moved into the beach chair position.  All bony prominences were well padded.  Attention was turned to the left shoulder where after routine prep and drape, an incision was made for an anterior approach to the shoulder and subcutaneous tissues down to the level of the deltopectoral interval, which was clearly identified and retractors were put in place.   Clavipectoral fascia was divided on its lateral border and a self-retaining retractor was put in place.  Following this, the biceps tendon was tracked out of its groove, released, and tenodesed to the pectoralis muscle on the upper border of the pec, which was taken down by about 1 cm and then this was tenodesed in that area.  Once this was done, the subscapularis osteotomy was performed and the subscapularis was mobilized inferiorly, superiorly, and to the glenoid rim.  A labrectomy was then performed as well as a capsular release and osteophytes were removed, gaining access to mobility to this area.  At this time, inferior labrectomy was performed, posterior labrectomy was performed, and once this was done, attention was turned towards placement of retractors around for the glenoid.  The glenoid then had a 44 trial placed.  The central PEG was drilled.  We over-drilled this and then reamed it and then put a central PEG in and then drilled 3 peripheral pegs.  Once this was done, this was dried up completely and then the 44-mm anchor PEG glenoid was cemented into place and held while the cement was allowed to harden.  Once it was completely hardened, attention was turned to the stem side.  We used a central opener and went up to a size 10 reamer and used  10 body and got excellent positioning, fit and fill, and after that the final head was placed.  It was a 44 eccentric head, eccentric to the back and superiorly.  Once this was done, prior to placing the stem, we put 4 drill holes through the cortical bone in the bicipital groove, and once that was completed, the sutures were placed in a Mason-Allen type fashion around the far portion of the bone and with the bone replaced anatomically, the 4 sutures were tied individually.  This gave excellent repair of the subscapularis osteotomy.  Once this was done, the rotator tear between the supraspinatus and subscapularis was repaired with  figure-of-eight sutures x2.  At this point, a very nice repair had been done completely of the rotator cuff.  The biceps had been tenodesed and the total shoulder had been performed.  At this point, the shoulder was copiously and thoroughly irrigated and suctioned dry.  The arm was put through range of motion and had about 35 degrees of external rotation, which was excellent, and elevation was full.  Again, irrigation was undertaken and closed with 0 Vicryl, 2-0 Vicryl, and 3-0 Monocryl subcuticular.  The patient was placed into a sling and taken to recovery room and noted to be in satisfactory condition.  She will be admitted for physical and occupational therapy and discharge in 1-2 days.  ESTIMATED BLOOD LOSS:  150 mL.  COMPLICATIONS:  None.     Alta Corning, M.D.     Corliss Skains  D:  05/09/2015  T:  05/10/2015  Job:  [id number]

## 2015-05-11 MED ORDER — OXYCODONE-ACETAMINOPHEN 5-325 MG PO TABS: 1.0000 | ORAL_TABLET | ORAL | Status: DC | PRN

## 2015-05-11 MED ORDER — METHOCARBAMOL 500 MG PO TABS: 500.0000 mg | ORAL_TABLET | Freq: Four times a day (QID) | ORAL | Status: DC | PRN

## 2015-05-11 MED ORDER — ASPIRIN 325 MG PO TBEC: 325.0000 mg | DELAYED_RELEASE_TABLET | Freq: Two times a day (BID) | ORAL | Status: DC

## 2015-05-11 NOTE — Progress Notes (Signed)
Occupational Therapy Treatment Patient Details Name: [name] MRN: [id number] DOB: [birth year] Today's Date: 05/11/2015    History of present illness Left TSA   OT comments  Patient making good progress towards OT goals. Pt min assist for ADLs secondary to immobilization of LUE shoulder, however patient is able to direct care prn independently. Pt with appropriate questions and concerns. Encouraged patient to follow shoulder d/c protocol sheet until her MD instructs her otherwise. Pt may benefit from OPOT if/once MD deems appropriate.    Follow Up Recommendations  No OT follow up;Supervision - Intermittent    Equipment Recommendations  None recommended by OT    Recommendations for Other Services  None at this time   Precautions / Restrictions Precautions Precautions: Shoulder Type of Shoulder Precautions: NWB, no shoulder A/PROM, Limit FF -> 90*, external rotation -> neutral, AROM->elbow, wrist, hand ok to tolerance Shoulder Interventions: Shoulder sling/immobilizer;Off for dressing/bathing/exercises Restrictions Weight Bearing Restrictions: Yes LUE Weight Bearing: Non weight bearing    Mobility Bed Mobility Overal bed mobility: Independent  Transfers Overall transfer level: Independent Equipment used: None    Balance Overall balance assessment: No apparent balance deficits (not formally assessed)    ADL Overall ADL's : Needs assistance/impaired General ADL Comments: Pt mod I for functional transfers and min assist for ADLs due to immobilization of LUE. See shoulder instructions & exercises for education taught and exercises completed this session.      Cognition   Behavior During Therapy: WFL for tasks assessed/performed Overall Cognitive Status: Within Functional Limits for tasks assessed     Exercises Shoulder Exercises Pendulum Exercise:  (n/a) Shoulder Flexion: PROM;Left;10 reps;Seated (lap slides) Shoulder Extension: PROM;Left;10 reps;Seated (lap  slides) Shoulder ABduction:  (n/a) Shoulder External Rotation:  (only to neutral) Elbow Flexion: AROM;Left;10 reps;Seated Elbow Extension: AROM;Left;10 reps;Seated Wrist Flexion: AROM;Left;10 reps;Seated Wrist Extension: AROM;Left;10 reps;Seated Digit Composite Flexion: AROM;Left;10 reps;Seated Composite Extension: AROM;Left;10 reps;Seated Neck Lateral Flexion - Right: AROM;10 reps;Seated Donning/doffing shirt without moving shoulder: Minimal assistance;Patient able to independently direct caregiver Method for sponge bathing under operated UE: Minimal assistance;Patient able to independently direct caregiver Donning/doffing sling/immobilizer: Independent Correct positioning of sling/immobilizer: Independent Pendulum exercises (written home exercise program):  (n/a) ROM for elbow, wrist and digits of operated UE: Independent Sling wearing schedule (on at all times/off for ADL's): Independent Proper positioning of operated UE when showering: Independent Dressing change:  (n/a) Positioning of UE while sleeping: Independent   Shoulder Instructions Shoulder Instructions Donning/doffing shirt without moving shoulder: Minimal assistance;Patient able to independently direct caregiver Method for sponge bathing under operated UE: Minimal assistance;Patient able to independently direct caregiver Donning/doffing sling/immobilizer: Independent Correct positioning of sling/immobilizer: Independent Pendulum exercises (written home exercise program):  (n/a) ROM for elbow, wrist and digits of operated UE: Independent Sling wearing schedule (on at all times/off for ADL's): Independent Proper positioning of operated UE when showering: Independent Dressing change:  (n/a) Positioning of UE while sleeping: Independent          Pertinent Vitals/ Pain       Pain Assessment: Faces Faces Pain Scale: Hurts whole lot Pain Location: LUE shoulder during certain movements  Pain Descriptors / Indicators:  Aching;Sharp Pain Intervention(s): Limited activity within patient's tolerance;Monitored during session;Repositioned;Relaxation   Frequency Min 3X/week     Progress Toward Goals  OT Goals(current goals can now befound in the care plan section)  Progress towards OT goals: Progressing toward goals     Plan Discharge plan remains appropriate    End of Session Equipment Utilized During Treatment:  Other (comment) (sling)   Activity Tolerance Patient tolerated treatment well   Patient Left with call bell/phone within reach;in bed (EOB)    Time: 2072-1828 OT Time Calculation (min): 21 min  Charges: OT General Charges $OT Visit: 1 Procedure OT Treatments $Therapeutic Activity: 8-22 mins  Promiss Labarbera , MS, OTR/L, CLT Pager: [phone]  05/11/2015, 1:12 PM

## 2015-05-11 NOTE — Progress Notes (Signed)
PATIENT ID: [name]  MRN: [id number]  DOB/AGE:  January 01, 1966 / 49 y.o.  2 Days Post-Op Procedure(s) (LRB): TOTAL SHOULDER ARTHROPLASTY (Left)    PROGRESS NOTE Subjective:   Patient is alert, oriented, no Nausea, no Vomiting, yes passing gas, yes Bowel Movement. Taking PO well. Denies SOB, Chest or Calf Pain. Using Incentive Spirometer, PAS in place. Ambulate WBAT, Patient reports pain as moderate.    Objective: Vital signs in last 24 hours: Temp:  [98.3 F (36.8 C)-98.4 F (36.9 C)] 98.3 F (36.8 C) (07/03 0448) Pulse Rate:  [99-113] 113 (07/03 0448) Resp:  [16-17] 16 (07/03 0448) BP: (129-145)/(76-80) 129/76 mmHg (07/03 0448) SpO2:  [94 %-96 %] 96 % (07/03 0448)    Intake/Output from previous day: I/O last 3 completed shifts: In: 840 [P.O.:840] Out: 300 [Urine:300]   Intake/Output this shift:     LABORATORY DATA:  Recent Labs  05/10/15 0444  WBC 12.8*  HGB 12.5  HCT 38.3  PLT 297  NA 137  K 3.9  CL 103  CO2 27  BUN 8  CREATININE 0.50  GLUCOSE 105*  CALCIUM 8.5*    Examination: Neurologically intact Neurovascular intact Sensation intact distally Intact pulses distally Incision: dressing C/D/I No cellulitis present}  Assessment:   2 Days Post-Op Procedure(s) (LRB): TOTAL SHOULDER ARTHROPLASTY (Left) ADDITIONAL DIAGNOSIS:    Plan: Sling, PROM limited to 40ER 140 FF  DVT Prophylaxis: Aspirin 325 BID  DISCHARGE PLAN: Home when pt passes therapy goals.     [name] R 05/11/2015, 9:45 AM

## 2015-05-11 NOTE — Discharge Summary (Signed)
Patient ID: [name] MRN: [id number] DOB/AGE: 08-Oct-1966 49 y.o.  Admit date: 05/09/2015 Discharge date: 05/11/2015  Admission Diagnoses:  Active Problems:   Osteoarthritis of left shoulder   Discharge Diagnoses:  Same  Past Medical History  Diagnosis Date  . COPD (chronic obstructive pulmonary disease)   . Arthritis   . Fibromyalgia     Surgeries: Procedure(s): TOTAL SHOULDER ARTHROPLASTY on 05/09/2015   Consultants:    Discharged Condition: Improved  Hospital Course: [name] is an 49 y.o. female who was admitted 05/09/2015 for operative treatment of<principal problem not specified>. Patient has severe unremitting pain that affects sleep, daily activities, and work/hobbies. After pre-op clearance the patient was taken to the operating room on 05/09/2015 and underwent  Procedure(s): TOTAL SHOULDER ARTHROPLASTY.    Patient was given perioperative antibiotics: Anti-infectives    Start     Dose/Rate Route Frequency Ordered Stop   05/09/15 1900  vancomycin (VANCOCIN) IVPB 1000 mg/200 mL premix     1,000 mg 200 mL/hr over 60 Minutes Intravenous Every 12 hours 05/09/15 1223 05/09/15 1921   05/09/15 0645  clindamycin (CLEOCIN) IVPB 900 mg     900 mg 100 mL/hr over 30 Minutes Intravenous To ShortStay Surgical 05/08/15 1301 05/09/15 0810       Patient was given sequential compression devices, early ambulation, and chemoprophylaxis to prevent DVT.  Patient benefited maximally from hospital stay and there were no complications.    Recent vital signs: Patient Vitals for the past 24 hrs:  BP Temp Temp src Pulse Resp SpO2  05/11/15 0448 129/76 mmHg 98.3 F (36.8 C) Oral (!) 113 16 96 %  05/10/15 1947 (!) 145/80 mmHg 98.4 F (36.9 C) Oral 99 17 94 %     Recent laboratory studies:  Recent Labs  05/10/15 0444  WBC 12.8*  HGB 12.5  HCT 38.3  PLT 297  NA 137  K 3.9  CL 103  CO2 27  BUN 8  CREATININE 0.50  GLUCOSE 105*  CALCIUM 8.5*     Discharge  Medications:     Medication List    STOP taking these medications        ibuprofen 800 MG tablet  Commonly known as:  ADVIL,MOTRIN      TAKE these medications        albuterol 108 (90 BASE) MCG/ACT inhaler  Commonly known as:  PROVENTIL HFA;VENTOLIN HFA  Inhale 2 puffs into the lungs every 4 (four) hours as needed for wheezing or shortness of breath. ProAir     ANORO ELLIPTA 62.5-25 MCG/INH Aepb  Generic drug:  Umeclidinium-Vilanterol  Inhale 1 puff into the lungs daily.     aspirin 325 MG EC tablet  Take 1 tablet (325 mg total) by mouth 2 (two) times daily.     b complex vitamins tablet  Take 1 tablet by mouth daily.     clotrimazole 1 % cream  Commonly known as:  LOTRIMIN  Apply 1 application topically daily.     diphenhydrAMINE 12.5 MG/5ML liquid  Commonly known as:  BENADRYL  Take 225 mg by mouth once as needed (allergic reaction to bees or spiders).     DULoxetine 60 MG capsule  Commonly known as:  CYMBALTA  Take 60 mg by mouth 2 (two) times daily.     fluticasone 50 MCG/ACT nasal spray  Commonly known as:  FLONASE  Place 2 sprays into both nostrils daily.     methocarbamol 500 MG tablet  Commonly known as:  ROBAXIN  Take 1 tablet (500 mg total) by mouth every 6 (six) hours as needed for muscle spasms.     multivitamin with minerals Tabs tablet  Take 1 tablet by mouth daily.     oxyCODONE-acetaminophen 5-325 MG per tablet  Commonly known as:  PERCOCET/ROXICET  Take 1-2 tablets by mouth every 4 (four) hours as needed (pain).     oxyCODONE-acetaminophen 5-325 MG per tablet  Commonly known as:  PERCOCET/ROXICET  Take 1-2 tablets by mouth every 4 (four) hours as needed (pain).     oxymorphone 10 MG T12a 12 hr tablet  Commonly known as:  OPANA ER  Take 10 mg by mouth 2 (two) times daily.     pregabalin 100 MG capsule  Commonly known as:  LYRICA  Take 300 mg by mouth at bedtime.        Diagnostic Studies: Dg Chest 2 View  04/29/2015   CLINICAL DATA:   Preop for left shoulder replacement, smoking history  EXAM: CHEST  2 VIEW  COMPARISON:  Chest x-ray of 06/03/2008  FINDINGS: No active infiltrate or effusion is seen. Mediastinal and hilar contours are unremarkable. The heart is within normal limits in size. Mild thoracolumbar curvature is noted with degenerative change.  IMPRESSION: No active cardiopulmonary disease.   Electronically Signed   By: Ivar Drape M.D.   On: 04/29/2015 17:26   Dg Shoulder Left Port  05/09/2015   CLINICAL DATA:  Status post left shoulder arthroplasty.  EXAM: LEFT SHOULDER - 1 VIEW  COMPARISON:  None.  FINDINGS: Left shoulder arthroplasty in satisfactory position on this single view.  No fracture or dislocation is seen.  Visualized left lung is clear.  IMPRESSION: Left shoulder arthroplasty in satisfactory position.   Electronically Signed   By: Julian Hy M.D.   On: 05/09/2015 11:51    Disposition: 01-Home or Self Care      Discharge Instructions    Call MD / Call 911    Complete by:  As directed   If you experience chest pain or shortness of breath, CALL 911 and be transported to the hospital emergency room.  If you develope a fever above 101 F, pus (white drainage) or increased drainage or redness at the wound, or calf pain, call your surgeon's office.     Constipation Prevention    Complete by:  As directed   Drink plenty of fluids.  Prune juice may be helpful.  You may use a stool softener, such as Colace (over the counter) 100 mg twice a day.  Use MiraLax (over the counter) for constipation as needed.     Diet - low sodium heart healthy    Complete by:  As directed      Discharge instructions    Complete by:  As directed   Follow up in office with Dr. Berenice Primas in 10-14 days     Driving restrictions    Complete by:  As directed   No driving for 2 weeks     Increase activity slowly as tolerated    Complete by:  As directed            Follow-up Information    Follow up with GRAVES,JOHN L, MD In 10  days.   Specialty:  Orthopedic Surgery   Contact information:   [place] [address] [phone]        Signed: Theodosia Quay 05/11/2015, 9:52 AM

## 2015-05-11 NOTE — Discharge Instructions (Signed)
Shoulder Joint Replacement, Care After °Refer to this sheet in the next few weeks. These instructions provide you with information on caring for yourself after your procedure. Your health care provider may also give you more specific instructions. Your treatment has been planned according to current medical practices, but problems sometimes occur. Call your health care provider if you have any problems or questions after your procedure. °WHAT TO EXPECT AFTER THE PROCEDURE °After your procedure, your arm and shoulder will typically be stiff and bruised. This will improve over time. °HOME CARE INSTRUCTIONS  °· You may resume your normal diet and activities as directed by your surgeon. °· You should regain full use of your shoulder in 6 weeks. °· Your arm will be in a sling. You will need to wear this for 4-6 weeks after surgery. °· Wear the sling every night for at least the first month, or as instructed by your surgeon. °· Do not use your arm to push yourself up in bed or from a chair. This requires too much muscle. °· Follow the program of home exercises suggested. Do the exercises 4-5 times a day for a month or as directed. °· Try not to overuse your shoulder. Overusing the shoulder is easy to do if this is the first time you have been pain free in a long time. Early overuse of the shoulder may result in later problems. °· Do not lift anything heavier than a cup of coffee for the first 6 weeks after surgery. °· Ask for help. Your health care provider may be able to suggest a clinic or agency for this if you do not have home support. °· Do not participate in contact sports or do any heavy lifting (more than 10 lb [4.5 kg]) for at least 6 months, or as directed. °· Apply ice to the injured area for the first 2 days after surgery: °¨ Put ice in a plastic bag. °¨ Place a towel between your skin and the bag. °¨ Leave the ice on for 20 minutes, 2-3 times a day. °· Change dressings if necessary or as directed. °· Only  take over-the-counter or prescription medicines for pain, discomfort, or fever as directed by your health care provider. °· Keep all follow-up appointments as directed. °SEEK MEDICAL CARE IF: °· You have redness, swelling, or increasing pain in the wound. °· You see pus coming from the wound. °· You have a fever. °· You notice a bad smell coming from the wound or dressing. °· The edges of the wound break open after sutures or staples have been removed. °· You have increasing pain with movement of the shoulder. °SEEK IMMEDIATE MEDICAL CARE IF:  °· You develop a rash. °· You have chest pain or shortness of breath. °· You have any reaction or side effects to medicine given. °MAKE SURE YOU: °· Understand these instructions. °· Will watch your condition. °· Will get help right away if you are not doing well or get worse. °Document Released: 05/14/2005 Document Revised: 10/30/2013 Document Reviewed: 05/24/2013 °Exit[employer]® Patient Information ©2015 [employer]. This information is not intended to replace advice given to you by your health care provider. Make sure you discuss any questions you have with your health care provider. ° °

## 2015-05-13 ENCOUNTER — Encounter (HOSPITAL_COMMUNITY): Payer: Self-pay | Admitting: Orthopedic Surgery

## 2015-11-09 DIAGNOSIS — Z860100 Personal history of colon polyps, unspecified: Secondary | ICD-10-CM

## 2015-11-09 HISTORY — DX: Personal history of colonic polyps: Z86.010

## 2015-11-09 HISTORY — DX: Personal history of colon polyps, unspecified: Z86.0100

## 2016-05-04 ENCOUNTER — Other Ambulatory Visit: Payer: Self-pay | Admitting: Orthopedic Surgery

## 2016-05-06 ENCOUNTER — Encounter (HOSPITAL_COMMUNITY)
Admission: RE | Admit: 2016-05-06 | Discharge: 2016-05-06 | Disposition: A | Discharge status: Home / Self Care | Payer: BLUE CROSS/BLUE SHIELD | Source: Ambulatory Visit | Attending: Orthopedic Surgery | Admitting: Orthopedic Surgery

## 2016-05-06 ENCOUNTER — Other Ambulatory Visit: Payer: Self-pay

## 2016-05-06 ENCOUNTER — Encounter (HOSPITAL_COMMUNITY): Payer: Self-pay

## 2016-05-06 ENCOUNTER — Ambulatory Visit (HOSPITAL_COMMUNITY)
Admission: RE | Admit: 2016-05-06 | Discharge: 2016-05-06 | Disposition: A | Discharge status: Home / Self Care | Payer: BLUE CROSS/BLUE SHIELD | Source: Ambulatory Visit | Attending: Orthopedic Surgery | Admitting: Orthopedic Surgery

## 2016-05-06 DIAGNOSIS — Z01812 Encounter for preprocedural laboratory examination: Secondary | ICD-10-CM | POA: Diagnosis not present

## 2016-05-06 DIAGNOSIS — Z01818 Encounter for other preprocedural examination: Secondary | ICD-10-CM | POA: Insufficient documentation

## 2016-05-06 DIAGNOSIS — R918 Other nonspecific abnormal finding of lung field: Secondary | ICD-10-CM | POA: Diagnosis not present

## 2016-05-06 DIAGNOSIS — I517 Cardiomegaly: Secondary | ICD-10-CM | POA: Insufficient documentation

## 2016-05-06 HISTORY — DX: Essential (primary) hypertension: I10

## 2016-05-06 LAB — CBC WITH DIFFERENTIAL/PLATELET
Basophils Absolute: 0 10*3/uL (ref 0.0–0.1)
Basophils Relative: 0 %
Eosinophils Absolute: 0.3 10*3/uL (ref 0.0–0.7)
Eosinophils Relative: 2 %
HCT: 42.9 % (ref 36.0–46.0)
Hemoglobin: 14 g/dL (ref 12.0–15.0)
Lymphocytes Relative: 27 %
Lymphs Abs: 3.2 10*3/uL (ref 0.7–4.0)
MCH: 30.3 pg (ref 26.0–34.0)
MCHC: 32.6 g/dL (ref 30.0–36.0)
MCV: 92.9 fL (ref 78.0–100.0)
Monocytes Absolute: 0.9 10*3/uL (ref 0.1–1.0)
Monocytes Relative: 7 %
Neutro Abs: 7.7 10*3/uL (ref 1.7–7.7)
Neutrophils Relative %: 64 %
Platelets: 390 10*3/uL (ref 150–400)
RBC: 4.62 MIL/uL (ref 3.87–5.11)
RDW: 12.9 % (ref 11.5–15.5)
WBC: 12.1 10*3/uL — ABNORMAL HIGH (ref 4.0–10.5)

## 2016-05-06 LAB — COMPREHENSIVE METABOLIC PANEL
ALT: 18 U/L (ref 14–54)
AST: 17 U/L (ref 15–41)
Albumin: 3.9 g/dL (ref 3.5–5.0)
Alkaline Phosphatase: 123 U/L (ref 38–126)
Anion gap: 8 (ref 5–15)
BUN: 13 mg/dL (ref 6–20)
CO2: 27 mmol/L (ref 22–32)
Calcium: 9.9 mg/dL (ref 8.9–10.3)
Chloride: 102 mmol/L (ref 101–111)
Creatinine, Ser: 0.59 mg/dL (ref 0.44–1.00)
GFR calc Af Amer: >60 mL/min (ref >60)
GFR calc non Af Amer: >60 mL/min (ref >60)
Glucose, Bld: 101 mg/dL — ABNORMAL HIGH (ref 65–99)
Potassium: 4.2 mmol/L (ref 3.5–5.1)
Sodium: 137 mmol/L (ref 135–145)
Total Bilirubin: 0.4 mg/dL (ref 0.3–1.2)
Total Protein: 7.1 g/dL (ref 6.5–8.1)

## 2016-05-06 LAB — URINALYSIS, ROUTINE W REFLEX MICROSCOPIC
Bilirubin Urine: NEGATIVE
Glucose, UA: NEGATIVE mg/dL
Hgb urine dipstick: NEGATIVE
Ketones, ur: NEGATIVE mg/dL
Leukocytes, UA: NEGATIVE
Nitrite: NEGATIVE
Protein, ur: NEGATIVE mg/dL
Specific Gravity, Urine: 1.014 (ref 1.005–1.030)
pH: 6.5 (ref 5.0–8.0)

## 2016-05-06 LAB — PROTIME-INR
INR: 1.11 (ref 0.00–1.49)
Prothrombin Time: 14.5 seconds (ref 11.6–15.2)

## 2016-05-06 LAB — SURGICAL PCR SCREEN
MRSA, PCR: NEGATIVE
Staphylococcus aureus: POSITIVE — AB

## 2016-05-06 LAB — APTT: aPTT: 34 seconds (ref 24–37)

## 2016-05-06 NOTE — Progress Notes (Signed)
Pt. Seen by Dr Bernita Buffy approx. 4 weeks ago for headaches, pt. Reports that MD ordered [facility] to see her to eval. Her pulse Ox sat. She states she was told by [employer] that she needs O2 at night but she cannot afford It

## 2016-05-06 NOTE — Progress Notes (Signed)
Requested notes from Dr. Tonye Pearson office, requesting that the anesth. Review be done as well.

## 2016-05-06 NOTE — Pre-Procedure Instructions (Signed)
[name]  05/06/2016      [facility] [address] [place] - [address] [place] ([place]) [place] [address] Phone: [phone] Fax: [phone]    Your procedure is scheduled on 05/17/2016 .  Report to [facility] Admitting at 1:30 P.M.  Call this number if you have problems the morning of surgery:  [phone]   Remember:  Do not eat food or drink liquids after midnight.  On Sunday night   Take these medicines the morning of surgery with A SIP OF WATER ::  Cymbalta, use nasal spray, take gabapentin   Do not wear jewelry, make-up or nail polish.   Do not wear lotions, powders, or perfumes.  You may wear deoderant.   Do not shave 48 hours prior to surgery.     Do not bring valuables to the hospital.   [facility] is not responsible for any belongings or valuables.  Contacts, dentures or bridgework may not be worn into surgery.  Leave your suitcase in the car.  After surgery it may be brought to your room.  For patients admitted to the hospital, discharge time will be determined by your treatment team.  Patients discharged the day of surgery will not be allowed to drive home.   Name and phone number of your driver:   With family  Special instructions:  Special Instructions: [facility] - Preparing for Surgery  Before surgery, you can play an important role.  Because skin is not sterile, your skin needs to be as free of germs as possible.  You can reduce the number of germs on you skin by washing with CHG (chlorahexidine gluconate) soap before surgery.  CHG is an antiseptic cleaner which kills germs and bonds with the skin to continue killing germs even after washing.  Please DO NOT use if you have an allergy to CHG or antibacterial soaps.  If your skin becomes reddened/irritated stop using the CHG and inform your nurse when you arrive at Short Stay.  Do not shave (including legs and underarms) for at  least 48 hours prior to the first CHG shower.  You may shave your face.  Please follow these instructions carefully:   1.  Shower with CHG Soap the night before surgery and the  morning of Surgery.  2.  If you choose to wash your hair, wash your hair first as usual with your  normal shampoo.  3.  After you shampoo, rinse your hair and body thoroughly to remove the  Shampoo.  4.  Use CHG as you would any other liquid soap.  You can apply chg directly to the skin and wash gently with scrungie or a clean washcloth.  5.  Apply the CHG Soap to your body ONLY FROM THE NECK DOWN.    Do not use on open wounds or open sores.  Avoid contact with your eyes, ears, mouth and genitals (private parts).  Wash genitals (private parts)   with your normal soap.  6.  Wash thoroughly, paying special attention to the area where your surgery will be performed.  7.  Thoroughly rinse your body with warm water from the neck down.  8.  DO NOT shower/wash with your normal soap after using and rinsing off   the CHG Soap.  9.  Pat yourself dry with a clean towel.            10 .  Wear clean pajamas.  11.  Place clean sheets on your bed the night of your first shower and do not sleep with pets.  Day of Surgery  Do not apply any lotions/deodorants the morning of surgery.  Please wear clean clothes to the hospital/surgery center.  Please read over the following fact sheets that you were given. Pain Booklet, Coughing and Deep Breathing, Total Joint Packet, MRSA Information and Surgical Site Infection Prevention

## 2016-05-06 NOTE — Progress Notes (Signed)
I called a prescription for Mupirocin ointment to [facility], [place]

## 2016-05-06 NOTE — Progress Notes (Signed)
Pt. Reports MD- gave her Rx for BP med. (?name)  But she was checking BP at home & it was never elevated so she decided not to take med.

## 2016-05-06 NOTE — Progress Notes (Signed)
2 checks on BP today, pt. Cautioned that if Dr.- PCP gave her BP med. That she should take it as ordered or go back to MD office to further discuss the need for or lack of need for BP med.

## 2016-05-07 NOTE — Progress Notes (Addendum)
Anesthesia Chart Review:  Pt is a 50 year old female scheduled for L total knee arthroplasty on 05/17/2016 with Dorna Leitz, MD.   PCP is Lorelee Market, MD.   PMH includes: HTN, COPD, OSA (is supposed to use O2 at night but does not have it yet). Current smoker. BMI 44. S/p total shoulder arthroplasty 05/09/15.   BP at PAT was 158/99, 152/100, and 158/92. Pt was prescribed anti-HTN med but hadn't started taking it because her home bp measurements were around 130/70. Pt reports she will start taking med.   Preoperative labs reviewed.   Chest x-ray 05/07/16 reviewed.  1. Nodular opacities noted projected over the right chest at the level of the right anterior fourth and sixth ribs. This may be related callus formation from prior rib fractures. To exclude pulmonary nodular lesions repeat PA and lateral chest x-ray and right rib series suggested for further evaluation. 2. Mild cardiomegaly.   EKG 05/06/16: Sinus tachycardia (102 bpm). Septal infarct, age undetermined  Echo 03/26/2015 (PCP's office): -Normal chamber sizes -Mild LV systolic dysfunction -normal wall motion -mild LVH -Trace to mild tricuspid and mitral regurgitation -Mild aortic regurgitation  Willeen Cass, FNP-BC [facility] Short Stay Surgical Center/Anesthesiology Phone: [phone] 05/07/2016 2:37 PM  Addendum:  Received office visit note from Dr, Gala Murdoch office dated 05/12/16. Pt is taking norvasc and hyzaar was added. BP on norvasc alone was 138/90. I spoke with pt by telephone and instructed her to take norvasc DOS with a sip of water but to hold hyzaar on the DOS.   If no changes, I anticipate pt can proceed with surgery as scheduled.   Willeen Cass, FNP-BC [facility] Short Stay Surgical Center/Anesthesiology Phone: [phone] 05/13/2016 12:51 PM

## 2016-05-14 ENCOUNTER — Other Ambulatory Visit: Payer: Self-pay | Admitting: Orthopedic Surgery

## 2016-05-14 MED ORDER — BUPIVACAINE LIPOSOME 1.3 % IJ SUSP
20.0000 mL | INTRAMUSCULAR | Status: AC
  Administered 2016-05-17: 20 mL
  Filled 2016-05-14 (×2): qty 20

## 2016-05-14 MED ORDER — CLINDAMYCIN PHOSPHATE 900 MG/50ML IV SOLN
900.0000 mg | INTRAVENOUS | Status: AC
  Administered 2016-05-17: 900 mg via INTRAVENOUS
  Filled 2016-05-14: qty 50

## 2016-05-17 ENCOUNTER — Encounter (HOSPITAL_COMMUNITY)
Admission: RE | Disposition: A | Discharge status: Home Health | Payer: Self-pay | Source: Ambulatory Visit | Attending: Orthopedic Surgery

## 2016-05-17 ENCOUNTER — Encounter (HOSPITAL_COMMUNITY): Payer: Self-pay | Admitting: *Deleted

## 2016-05-17 ENCOUNTER — Inpatient Hospital Stay (HOSPITAL_COMMUNITY): Payer: BLUE CROSS/BLUE SHIELD

## 2016-05-17 ENCOUNTER — Inpatient Hospital Stay (HOSPITAL_COMMUNITY)
Admission: RE | Admit: 2016-05-17 | Discharge: 2016-05-19 | DRG: 470 | Disposition: A | Discharge status: Home Health | Payer: BLUE CROSS/BLUE SHIELD | Source: Ambulatory Visit | Attending: Orthopedic Surgery | Admitting: Orthopedic Surgery

## 2016-05-17 ENCOUNTER — Inpatient Hospital Stay (HOSPITAL_COMMUNITY): Payer: BLUE CROSS/BLUE SHIELD | Admitting: Emergency Medicine

## 2016-05-17 ENCOUNTER — Inpatient Hospital Stay (HOSPITAL_COMMUNITY): Payer: BLUE CROSS/BLUE SHIELD | Admitting: Certified Registered Nurse Anesthetist

## 2016-05-17 DIAGNOSIS — M7591 Shoulder lesion, unspecified, right shoulder: Secondary | ICD-10-CM | POA: Diagnosis present

## 2016-05-17 DIAGNOSIS — M1712 Unilateral primary osteoarthritis, left knee: Principal | ICD-10-CM | POA: Diagnosis present

## 2016-05-17 DIAGNOSIS — Z91038 Other insect allergy status: Secondary | ICD-10-CM | POA: Diagnosis not present

## 2016-05-17 DIAGNOSIS — Z881 Allergy status to other antibiotic agents status: Secondary | ICD-10-CM

## 2016-05-17 DIAGNOSIS — J449 Chronic obstructive pulmonary disease, unspecified: Secondary | ICD-10-CM | POA: Diagnosis present

## 2016-05-17 DIAGNOSIS — Z882 Allergy status to sulfonamides status: Secondary | ICD-10-CM | POA: Diagnosis not present

## 2016-05-17 DIAGNOSIS — F17210 Nicotine dependence, cigarettes, uncomplicated: Secondary | ICD-10-CM | POA: Diagnosis present

## 2016-05-17 DIAGNOSIS — Z6841 Body Mass Index (BMI) 40.0 and over, adult: Secondary | ICD-10-CM

## 2016-05-17 DIAGNOSIS — I10 Essential (primary) hypertension: Secondary | ICD-10-CM | POA: Diagnosis present

## 2016-05-17 DIAGNOSIS — M7581 Other shoulder lesions, right shoulder: Secondary | ICD-10-CM | POA: Diagnosis present

## 2016-05-17 DIAGNOSIS — M797 Fibromyalgia: Secondary | ICD-10-CM | POA: Diagnosis present

## 2016-05-17 DIAGNOSIS — Z96612 Presence of left artificial shoulder joint: Secondary | ICD-10-CM | POA: Diagnosis present

## 2016-05-17 DIAGNOSIS — Z91030 Bee allergy status: Secondary | ICD-10-CM

## 2016-05-17 DIAGNOSIS — G4730 Sleep apnea, unspecified: Secondary | ICD-10-CM | POA: Diagnosis present

## 2016-05-17 DIAGNOSIS — M19012 Primary osteoarthritis, left shoulder: Secondary | ICD-10-CM | POA: Diagnosis present

## 2016-05-17 DIAGNOSIS — M25562 Pain in left knee: Secondary | ICD-10-CM | POA: Diagnosis present

## 2016-05-17 DIAGNOSIS — R9389 Abnormal findings on diagnostic imaging of other specified body structures: Secondary | ICD-10-CM

## 2016-05-17 HISTORY — PX: SHOULDER INJECTION: SHX5048

## 2016-05-17 HISTORY — PX: TOTAL KNEE ARTHROPLASTY: SHX125

## 2016-05-17 SURGERY — ARTHROPLASTY, KNEE, TOTAL
Anesthesia: General | Site: Shoulder | Laterality: Right

## 2016-05-17 MED ORDER — HYDROMORPHONE HCL 1 MG/ML IJ SOLN
INTRAMUSCULAR | Status: AC
  Administered 2016-05-17: 0.5 mg via INTRAVENOUS
  Filled 2016-05-17: qty 2

## 2016-05-17 MED ORDER — DEXAMETHASONE SODIUM PHOSPHATE 10 MG/ML IJ SOLN
10.0000 mg | Freq: Two times a day (BID) | INTRAMUSCULAR | Status: AC
  Administered 2016-05-17 – 2016-05-18 (×3): 10 mg via INTRAVENOUS
  Filled 2016-05-17 (×3): qty 1

## 2016-05-17 MED ORDER — METHYLPREDNISOLONE ACETATE 80 MG/ML IJ SUSP
INTRAMUSCULAR | Status: AC
  Filled 2016-05-17: qty 1

## 2016-05-17 MED ORDER — TRANEXAMIC ACID 1000 MG/10ML IV SOLN
1000.0000 mg | INTRAVENOUS | Status: AC
  Administered 2016-05-17: 1000 mg via INTRAVENOUS
  Filled 2016-05-17: qty 10

## 2016-05-17 MED ORDER — METHOCARBAMOL 1000 MG/10ML IJ SOLN
500.0000 mg | Freq: Four times a day (QID) | INTRAVENOUS | Status: DC | PRN
  Filled 2016-05-17: qty 5

## 2016-05-17 MED ORDER — GABAPENTIN 600 MG PO TABS
600.0000 mg | ORAL_TABLET | Freq: Two times a day (BID) | ORAL | Status: DC
  Administered 2016-05-18 – 2016-05-19 (×3): 600 mg via ORAL
  Filled 2016-05-17 (×3): qty 1

## 2016-05-17 MED ORDER — ASPIRIN EC 325 MG PO TBEC: 325.0000 mg | DELAYED_RELEASE_TABLET | Freq: Two times a day (BID) | ORAL | Status: DC

## 2016-05-17 MED ORDER — CLINDAMYCIN PHOSPHATE 600 MG/50ML IV SOLN
600.0000 mg | Freq: Four times a day (QID) | INTRAVENOUS | Status: AC
  Administered 2016-05-17 – 2016-05-18 (×2): 600 mg via INTRAVENOUS
  Filled 2016-05-17 (×2): qty 50

## 2016-05-17 MED ORDER — OXYCODONE HCL 5 MG PO TABS
5.0000 mg | ORAL_TABLET | ORAL | Status: DC | PRN
  Administered 2016-05-17 – 2016-05-19 (×9): 10 mg via ORAL
  Filled 2016-05-17 (×8): qty 2

## 2016-05-17 MED ORDER — FENTANYL CITRATE (PF) 250 MCG/5ML IJ SOLN
INTRAMUSCULAR | Status: AC
  Filled 2016-05-17: qty 5

## 2016-05-17 MED ORDER — HYDROMORPHONE HCL 1 MG/ML IJ SOLN
0.2500 mg | INTRAMUSCULAR | Status: DC | PRN
Start: 2016-05-17 — End: 2016-05-17
  Administered 2016-05-17 (×4): 0.5 mg via INTRAVENOUS

## 2016-05-17 MED ORDER — POLYETHYLENE GLYCOL 3350 17 G PO PACK: 17.0000 g | PACK | Freq: Every day | ORAL | Status: DC | PRN

## 2016-05-17 MED ORDER — OXYCODONE-ACETAMINOPHEN 5-325 MG PO TABS: 1.0000 | ORAL_TABLET | ORAL | Status: DC | PRN

## 2016-05-17 MED ORDER — PHENYLEPHRINE HCL 10 MG/ML IJ SOLN
INTRAMUSCULAR | Status: DC | PRN
  Administered 2016-05-17 (×2): 120 ug via INTRAVENOUS
  Administered 2016-05-17: 80 ug via INTRAVENOUS
  Administered 2016-05-17: 120 ug via INTRAVENOUS

## 2016-05-17 MED ORDER — PHENYLEPHRINE HCL 10 MG/ML IJ SOLN
10.0000 mg | INTRAVENOUS | Status: DC | PRN
  Administered 2016-05-17: 20 ug/min via INTRAVENOUS

## 2016-05-17 MED ORDER — TRANEXAMIC ACID 1000 MG/10ML IV SOLN
1000.0000 mg | Freq: Once | INTRAVENOUS | Status: AC
  Administered 2016-05-17: 1000 mg via INTRAVENOUS
  Filled 2016-05-17: qty 10

## 2016-05-17 MED ORDER — BUPIVACAINE HCL (PF) 0.25 % IJ SOLN
INTRAMUSCULAR | Status: AC
  Filled 2016-05-17: qty 30

## 2016-05-17 MED ORDER — BUPIVACAINE-EPINEPHRINE (PF) 0.5% -1:200000 IJ SOLN
INTRAMUSCULAR | Status: AC
  Filled 2016-05-17: qty 30

## 2016-05-17 MED ORDER — CYCLOBENZAPRINE HCL 10 MG PO TABS: 10.0000 mg | ORAL_TABLET | Freq: Three times a day (TID) | ORAL | Status: DC | PRN

## 2016-05-17 MED ORDER — ASPIRIN EC 325 MG PO TBEC
325.0000 mg | DELAYED_RELEASE_TABLET | Freq: Two times a day (BID) | ORAL | Status: DC
  Administered 2016-05-17 – 2016-05-19 (×4): 325 mg via ORAL
  Filled 2016-05-17 (×4): qty 1

## 2016-05-17 MED ORDER — HYDROMORPHONE HCL 1 MG/ML IJ SOLN
INTRAMUSCULAR | Status: AC
  Administered 2016-05-17: 0.5 mg via INTRAVENOUS
  Filled 2016-05-17: qty 1

## 2016-05-17 MED ORDER — MAGNESIUM CITRATE PO SOLN: 1.0000 | Freq: Once | ORAL | Status: DC | PRN

## 2016-05-17 MED ORDER — GABAPENTIN 300 MG PO CAPS
300.0000 mg | ORAL_CAPSULE | Freq: Every day | ORAL | Status: DC
  Administered 2016-05-17 – 2016-05-18 (×2): 300 mg via ORAL
  Filled 2016-05-17 (×2): qty 1

## 2016-05-17 MED ORDER — ZOLPIDEM TARTRATE 5 MG PO TABS: 5.0000 mg | ORAL_TABLET | Freq: Every evening | ORAL | Status: DC | PRN

## 2016-05-17 MED ORDER — ONDANSETRON HCL 4 MG/2ML IJ SOLN: 4.0000 mg | Freq: Four times a day (QID) | INTRAMUSCULAR | Status: DC | PRN

## 2016-05-17 MED ORDER — ONDANSETRON HCL 4 MG/2ML IJ SOLN
INTRAMUSCULAR | Status: DC | PRN
  Administered 2016-05-17: 4 mg via INTRAVENOUS

## 2016-05-17 MED ORDER — METHOCARBAMOL 500 MG PO TABS
500.0000 mg | ORAL_TABLET | Freq: Four times a day (QID) | ORAL | Status: DC | PRN
  Administered 2016-05-18 – 2016-05-19 (×2): 500 mg via ORAL
  Filled 2016-05-17 (×3): qty 1

## 2016-05-17 MED ORDER — SODIUM CHLORIDE 0.9 % IV SOLN: INTRAVENOUS | Status: DC

## 2016-05-17 MED ORDER — PROPOFOL 10 MG/ML IV BOLUS
INTRAVENOUS | Status: DC | PRN
  Administered 2016-05-17: 200 mg via INTRAVENOUS

## 2016-05-17 MED ORDER — ACETAMINOPHEN 650 MG RE SUPP: 650.0000 mg | Freq: Four times a day (QID) | RECTAL | Status: DC | PRN

## 2016-05-17 MED ORDER — DOCUSATE SODIUM 100 MG PO CAPS
100.0000 mg | ORAL_CAPSULE | Freq: Two times a day (BID) | ORAL | Status: DC
  Administered 2016-05-17 – 2016-05-19 (×4): 100 mg via ORAL
  Filled 2016-05-17 (×4): qty 1

## 2016-05-17 MED ORDER — BUPIVACAINE HCL (PF) 0.25 % IJ SOLN
INTRAMUSCULAR | Status: DC | PRN
  Administered 2016-05-17: 20 mL

## 2016-05-17 MED ORDER — HYDROMORPHONE HCL 1 MG/ML IJ SOLN
1.0000 mg | INTRAMUSCULAR | Status: DC | PRN
  Administered 2016-05-18 (×2): 2 mg via INTRAVENOUS
  Administered 2016-05-18: 1 mg via INTRAVENOUS
  Administered 2016-05-19: 2 mg via INTRAVENOUS
  Filled 2016-05-17 (×2): qty 2
  Filled 2016-05-17: qty 1
  Filled 2016-05-17: qty 2

## 2016-05-17 MED ORDER — METHYLPREDNISOLONE ACETATE 80 MG/ML IJ SUSP
INTRAMUSCULAR | Status: DC | PRN
  Administered 2016-05-17: 4 mL via INTRA_ARTICULAR

## 2016-05-17 MED ORDER — HYDROMORPHONE HCL 1 MG/ML IJ SOLN
0.5000 mg | INTRAMUSCULAR | Status: DC | PRN
  Administered 2016-05-17 (×2): 0.5 mg via INTRAVENOUS

## 2016-05-17 MED ORDER — DIPHENHYDRAMINE HCL 12.5 MG/5ML PO ELIX: 12.5000 mg | ORAL_SOLUTION | ORAL | Status: DC | PRN

## 2016-05-17 MED ORDER — KETOROLAC TROMETHAMINE 30 MG/ML IJ SOLN
INTRAMUSCULAR | Status: AC
  Filled 2016-05-17: qty 1

## 2016-05-17 MED ORDER — MEPERIDINE HCL 25 MG/ML IJ SOLN: 6.2500 mg | INTRAMUSCULAR | Status: DC | PRN

## 2016-05-17 MED ORDER — PROPOFOL 500 MG/50ML IV EMUL
INTRAVENOUS | Status: DC | PRN
  Administered 2016-05-17: 75 ug/kg/min via INTRAVENOUS

## 2016-05-17 MED ORDER — MIDAZOLAM HCL 5 MG/5ML IJ SOLN
INTRAMUSCULAR | Status: DC | PRN
  Administered 2016-05-17: 2 mg via INTRAVENOUS

## 2016-05-17 MED ORDER — KETOROLAC TROMETHAMINE 30 MG/ML IJ SOLN
30.0000 mg | Freq: Once | INTRAMUSCULAR | Status: AC
  Administered 2016-05-17: 30 mg via INTRAVENOUS

## 2016-05-17 MED ORDER — CYCLOBENZAPRINE HCL 10 MG PO TABS
10.0000 mg | ORAL_TABLET | Freq: Every day | ORAL | Status: DC
  Administered 2016-05-17 – 2016-05-18 (×2): 10 mg via ORAL
  Filled 2016-05-17 (×2): qty 1

## 2016-05-17 MED ORDER — LACTATED RINGERS IV SOLN
INTRAVENOUS | Status: DC
  Administered 2016-05-17 (×2): via INTRAVENOUS

## 2016-05-17 MED ORDER — ALUM & MAG HYDROXIDE-SIMETH 200-200-20 MG/5ML PO SUSP: 30.0000 mL | ORAL | Status: DC | PRN

## 2016-05-17 MED ORDER — SODIUM CHLORIDE 0.9 % IR SOLN
Status: DC | PRN
  Administered 2016-05-17: 3000 mL

## 2016-05-17 MED ORDER — CHLORHEXIDINE GLUCONATE 4 % EX LIQD: 60.0000 mL | Freq: Once | CUTANEOUS | Status: DC

## 2016-05-17 MED ORDER — 0.9 % SODIUM CHLORIDE (POUR BTL) OPTIME
TOPICAL | Status: DC | PRN
  Administered 2016-05-17: 1000 mL

## 2016-05-17 MED ORDER — ONDANSETRON HCL 4 MG PO TABS: 4.0000 mg | ORAL_TABLET | Freq: Four times a day (QID) | ORAL | Status: DC | PRN

## 2016-05-17 MED ORDER — HYDROMORPHONE HCL 2 MG PO TABS: 2.0000 mg | ORAL_TABLET | Freq: Four times a day (QID) | ORAL | Status: DC | PRN

## 2016-05-17 MED ORDER — MIDAZOLAM HCL 2 MG/2ML IJ SOLN
INTRAMUSCULAR | Status: AC
  Filled 2016-05-17: qty 2

## 2016-05-17 MED ORDER — ACETAMINOPHEN 325 MG PO TABS: 650.0000 mg | ORAL_TABLET | Freq: Four times a day (QID) | ORAL | Status: DC | PRN

## 2016-05-17 MED ORDER — FLUTICASONE PROPIONATE 50 MCG/ACT NA SUSP
2.0000 | Freq: Every day | NASAL | Status: DC
  Administered 2016-05-17 – 2016-05-19 (×2): 2 via NASAL
  Filled 2016-05-17: qty 16

## 2016-05-17 MED ORDER — DULOXETINE HCL 60 MG PO CPEP
60.0000 mg | ORAL_CAPSULE | Freq: Two times a day (BID) | ORAL | Status: DC
  Administered 2016-05-17 – 2016-05-19 (×4): 60 mg via ORAL
  Filled 2016-05-17 (×4): qty 1

## 2016-05-17 MED ORDER — PROMETHAZINE HCL 25 MG/ML IJ SOLN: 6.2500 mg | INTRAMUSCULAR | Status: DC | PRN

## 2016-05-17 MED ORDER — FENTANYL CITRATE (PF) 250 MCG/5ML IJ SOLN
INTRAMUSCULAR | Status: DC | PRN
  Administered 2016-05-17 (×2): 50 ug via INTRAVENOUS

## 2016-05-17 MED ORDER — BISACODYL 5 MG PO TBEC: 5.0000 mg | DELAYED_RELEASE_TABLET | Freq: Every day | ORAL | Status: DC | PRN

## 2016-05-17 MED ORDER — PROPOFOL 10 MG/ML IV BOLUS
INTRAVENOUS | Status: AC
  Filled 2016-05-17: qty 20

## 2016-05-17 SURGICAL SUPPLY — 76 items
APL SKNCLS STERI-STRIP NONHPOA (GAUZE/BANDAGES/DRESSINGS) ×2
BANDAGE ESMARK 6X9 LF (GAUZE/BANDAGES/DRESSINGS) IMPLANT
BENZOIN TINCTURE PRP APPL 2/3 (GAUZE/BANDAGES/DRESSINGS) ×4 IMPLANT
BLADE SAGITTAL 25.0X1.19X90 (BLADE) ×3 IMPLANT
BLADE SAGITTAL 25.0X1.19X90MM (BLADE) ×1
BLADE SAW SAG 90X13X1.27 (BLADE) ×4 IMPLANT
BNDG CMPR 9X6 STRL LF SNTH (GAUZE/BANDAGES/DRESSINGS)
BNDG COHESIVE 6X5 TAN STRL LF (GAUZE/BANDAGES/DRESSINGS) ×4 IMPLANT
BNDG ESMARK 6X9 LF (GAUZE/BANDAGES/DRESSINGS)
BOWL SMART MIX CTS (DISPOSABLE) ×4 IMPLANT
CAP KNEE TOTAL 3 SIGMA ×4 IMPLANT
CEMENT HV SMART SET (Cement) ×8 IMPLANT
CLOSURE STERI-STRIP 1/2X4 (GAUZE/BANDAGES/DRESSINGS) ×1
CLOSURE WOUND 1/2 X4 (GAUZE/BANDAGES/DRESSINGS) ×2
CLSR STERI-STRIP ANTIMIC 1/2X4 (GAUZE/BANDAGES/DRESSINGS) ×3 IMPLANT
COVER SURGICAL LIGHT HANDLE (MISCELLANEOUS) ×8 IMPLANT
CUFF TOURNIQUET SINGLE 34IN LL (TOURNIQUET CUFF) ×4 IMPLANT
CUFF TOURNIQUET SINGLE 44IN (TOURNIQUET CUFF) IMPLANT
DECANTER SPIKE VIAL GLASS SM (MISCELLANEOUS) ×4 IMPLANT
DRAPE EXTREMITY T 121X128X90 (DRAPE) ×4 IMPLANT
DRAPE IMP U-DRAPE 54X76 (DRAPES) ×4 IMPLANT
DRAPE U-SHAPE 47X51 STRL (DRAPES) ×4 IMPLANT
DRSG AQUACEL AG ADV 3.5X10 (GAUZE/BANDAGES/DRESSINGS) ×4 IMPLANT
DRSG MEPILEX BORDER 4X12 (GAUZE/BANDAGES/DRESSINGS) ×4 IMPLANT
DRSG PAD ABDOMINAL 8X10 ST (GAUZE/BANDAGES/DRESSINGS) ×4 IMPLANT
DURAPREP 26ML APPLICATOR (WOUND CARE) ×4 IMPLANT
ELECT CAUTERY BLADE 6.4 (BLADE) ×4 IMPLANT
ELECT REM PT RETURN 9FT ADLT (ELECTROSURGICAL) ×4
ELECTRODE REM PT RTRN 9FT ADLT (ELECTROSURGICAL) ×2 IMPLANT
EVACUATOR 1/8 PVC DRAIN (DRAIN) ×4 IMPLANT
FACESHIELD WRAPAROUND (MASK) ×8 IMPLANT
GAUZE SPONGE 4X4 12PLY STRL (GAUZE/BANDAGES/DRESSINGS) IMPLANT
GLOVE BIOGEL PI IND STRL 6.5 (GLOVE) ×4 IMPLANT
GLOVE BIOGEL PI IND STRL 8 (GLOVE) ×6 IMPLANT
GLOVE BIOGEL PI INDICATOR 6.5 (GLOVE) ×4
GLOVE BIOGEL PI INDICATOR 8 (GLOVE) ×6
GLOVE ECLIPSE 7.5 STRL STRAW (GLOVE) ×8 IMPLANT
GLOVE SURG SS PI 6.5 STRL IVOR (GLOVE) ×8 IMPLANT
GOWN STRL REUS W/ TWL LRG LVL3 (GOWN DISPOSABLE) IMPLANT
GOWN STRL REUS W/ TWL XL LVL3 (GOWN DISPOSABLE) ×4 IMPLANT
GOWN STRL REUS W/TWL LRG LVL3 (GOWN DISPOSABLE)
GOWN STRL REUS W/TWL XL LVL3 (GOWN DISPOSABLE) ×8
HANDPIECE INTERPULSE COAX TIP (DISPOSABLE) ×2
HOOD PEEL AWAY FACE SHEILD DIS (HOOD) ×12 IMPLANT
IMMOBILIZER KNEE 20 (SOFTGOODS) ×4 IMPLANT
IMMOBILIZER KNEE 20 THIGH 36 (SOFTGOODS) ×2 IMPLANT
IMMOBILIZER KNEE 22 UNIV (SOFTGOODS) ×4 IMPLANT
KIT BASIN OR (CUSTOM PROCEDURE TRAY) ×4 IMPLANT
KIT ROOM TURNOVER OR (KITS) ×4 IMPLANT
MANIFOLD NEPTUNE II (INSTRUMENTS) ×4 IMPLANT
NEEDLE 22X1 1/2 (OR ONLY) (NEEDLE) ×4 IMPLANT
NEEDLE SPNL 22GX3.5 QUINCKE BK (NEEDLE) ×4 IMPLANT
NS IRRIG 1000ML POUR BTL (IV SOLUTION) ×4 IMPLANT
PACK TOTAL JOINT (CUSTOM PROCEDURE TRAY) ×4 IMPLANT
PACK UNIVERSAL I (CUSTOM PROCEDURE TRAY) IMPLANT
PAD ARMBOARD 7.5X6 YLW CONV (MISCELLANEOUS) ×8 IMPLANT
PAD CAST 4YDX4 CTTN HI CHSV (CAST SUPPLIES) ×2 IMPLANT
PADDING CAST COTTON 4X4 STRL (CAST SUPPLIES) ×4
SET HNDPC FAN SPRY TIP SCT (DISPOSABLE) ×2 IMPLANT
SPONGE GAUZE 4X4 12PLY STER LF (GAUZE/BANDAGES/DRESSINGS) ×4 IMPLANT
STAPLER VISISTAT 35W (STAPLE) IMPLANT
STRIP CLOSURE SKIN 1/2X4 (GAUZE/BANDAGES/DRESSINGS) ×6 IMPLANT
SUCTION FRAZIER HANDLE 10FR (MISCELLANEOUS) ×2
SUCTION TUBE FRAZIER 10FR DISP (MISCELLANEOUS) ×2 IMPLANT
SUT MNCRL AB 3-0 PS2 18 (SUTURE) IMPLANT
SUT VIC AB 0 CTB1 27 (SUTURE) ×4 IMPLANT
SUT VIC AB 1 CT1 27 (SUTURE) ×6
SUT VIC AB 1 CT1 27XBRD ANBCTR (SUTURE) ×4 IMPLANT
SUT VIC AB 2-0 CTB1 (SUTURE) ×4 IMPLANT
SYR 30ML LL (SYRINGE) ×4 IMPLANT
SYR 50ML LL SCALE MARK (SYRINGE) ×4 IMPLANT
TOWEL OR 17X24 6PK STRL BLUE (TOWEL DISPOSABLE) ×4 IMPLANT
TOWEL OR 17X26 10 PK STRL BLUE (TOWEL DISPOSABLE) ×4 IMPLANT
TRAY FOLEY CATH 16FRSI W/METER (SET/KITS/TRAYS/PACK) ×4 IMPLANT
UPCHARGE REV TRAY MBT KNEE ×4 IMPLANT
WRAP KNEE MAXI GEL POST OP (GAUZE/BANDAGES/DRESSINGS) ×4 IMPLANT

## 2016-05-17 NOTE — Progress Notes (Signed)
Notified Dr. Lissa Hoard about patient repeat chest x-ray results, will continue to monitor.

## 2016-05-17 NOTE — Discharge Instructions (Signed)

## 2016-05-17 NOTE — Transfer of Care (Signed)
Immediate Anesthesia Transfer of Care Note  Patient: [name]  Procedure(s) Performed: Procedure(s): TOTAL KNEE ARTHROPLASTY (Left) SHOULDER INJECTION (Right)  Patient Location: PACU  Anesthesia Type:GA combined with regional for post-op pain  Level of Consciousness: awake, alert , oriented and patient cooperative  Airway & Oxygen Therapy: Patient Spontanous Breathing and Patient connected to nasal cannula oxygen  Post-op Assessment: Report given to RN and Post -op Vital signs reviewed and stable  Post vital signs: Reviewed and stable  Last Vitals:  Filed Vitals:   05/17/16 1307 05/17/16 1705  BP: 116/58   Pulse: 111   Temp: 36.8 C 36.7 C  Resp: 20     Last Pain:  Filed Vitals:   05/17/16 1709  PainSc: Asleep      Patients Stated Pain Goal: 2 ([id number])  Complications: No apparent anesthesia complications

## 2016-05-17 NOTE — Anesthesia Procedure Notes (Addendum)
Procedure Name: LMA Insertion Date/Time: 05/17/2016 3:06 PM Performed by: Layla Maw Pre-anesthesia Checklist: Patient identified, Patient being monitored, Timeout performed, Emergency Drugs available and Suction available Patient Re-evaluated:Patient Re-evaluated prior to inductionOxygen Delivery Method: Circle System Utilized Preoxygenation: Pre-oxygenation with 100% oxygen Intubation Type: IV induction Ventilation: Mask ventilation with difficulty, Two handed mask ventilation required and Oral airway inserted - appropriate to patient size LMA: LMA inserted LMA Size: 4.0 Number of attempts: 1 Placement Confirmation: positive ETCO2 and breath sounds checked- equal and bilateral Tube secured with: Tape Dental Injury: Teeth and Oropharynx as per pre-operative assessment    Spinal Patient location during procedure: OR Staffing Anesthesiologist: Nolon Nations Performed by: anesthesiologist  Preanesthetic Checklist Completed: patient identified, site marked, surgical consent, pre-op evaluation, timeout performed, IV checked, risks and benefits discussed and monitors and equipment checked Spinal Block Patient position: sitting Prep: ChloraPrep Patient monitoring: heart rate, continuous pulse ox and blood pressure Approach: right paramedian Location: L3-4 Injection technique: single-shot Needle Needle type: Sprotte  Needle gauge: 24 G Needle length: 12.7 cm Additional Notes Expiration date of kit checked and confirmed. Patient tolerated procedure well, without complications.

## 2016-05-17 NOTE — H&P (Signed)
TOTAL KNEE ADMISSION H&P  Patient is being admitted for left total knee arthroplasty.  Subjective:  Chief Complaint:left knee pain.  HPI: [name], 50 y.o. female, has a history of pain and functional disability in the left knee due to arthritis and has failed non-surgical conservative treatments for greater than 12 weeks to includeNSAID's and/or analgesics, corticosteriod injections, viscosupplementation injections, flexibility and strengthening excercises, supervised PT with diminished ADL's post treatment, use of assistive devices, weight reduction as appropriate and activity modification.  Onset of symptoms was gradual, starting 8 years ago with rapidlly worsening course since that time. The patient noted prior procedures on the knee to include  arthroscopy and menisectomy on the left knee(s).  Patient currently rates pain in the left knee(s) at 8 out of 10 with activity. Patient has night pain, worsening of pain with activity and weight bearing, pain that interferes with activities of daily living, pain with passive range of motion, crepitus and joint swelling.  Patient has evidence of subchondral cysts, subchondral sclerosis, periarticular osteophytes, joint subluxation and joint space narrowing by imaging studies. This patient has had failed all reasonable conservative care. There is no active infection.  Patient Active Problem List   Diagnosis Date Noted  . Osteoarthritis of left shoulder 05/09/2015  . TOBACCO ABUSE 06/20/2008  . CONSTIPATION 06/18/2008  . HEART MURMUR, BENIGN 06/18/2008  . OTHER RESPIRATORY COMPLICATIONS 22/12/5425   Past Medical History  Diagnosis Date  . COPD (chronic obstructive pulmonary disease) ([place])   . Arthritis   . Fibromyalgia   . Hypertension   . Sleep apnea     study done approx. 10 yrs., told that it was negative, but just pulse ox eval. & told that she needs O2 at night     Past Surgical History  Procedure Laterality Date  . Shoulder  surgery      x 2 on right shoulder  . Left hand      1 for ganglion cyst and next yr replace "joints" in left hand  . Laparoscopic salpingoopherectomy    . Knee cartilage surgery  2015    left  . Plantar fascia surgery      left foot  . Fracture surgery      left foot   . Tonsillectomy    . Total shoulder arthroplasty Left 05/09/2015    Procedure: TOTAL SHOULDER ARTHROPLASTY;  Surgeon: Dorna Leitz, MD;  Location: [facility];  Service: Orthopedics;  Laterality: Left;  . Tubal ligation      No prescriptions prior to admission   Allergies  Allergen Reactions  . Augmentin [Amoxicillin-Pot Clavulanate] Anaphylaxis  . Bee Venom Hives and Shortness Of Breath    Treats with benadryl  . Diclofenac Sodium Itching and Swelling    Swelling of eyes and face PENNSAID- topical gel  . Other Hives and Shortness Of Breath    Reaction to spider bites (treats with benadryl)  . Sulfa Antibiotics Anaphylaxis and Other (See Comments)    Childhood allergic reaction Sulfonamides    Social History  Substance Use Topics  . Smoking status: Current Some Day Smoker -- 0.50 packs/day for 30 years  . Smokeless tobacco: Not on file  . Alcohol Use: Yes     Comment: monthly    No family history on file.   ROS ROS: I have reviewed the patient's review of systems thoroughly and there are no positive responses as relates to the HPI. Objective:  Physical Exam  Vital signs in last 24 hours:   Well-developed  well-nourished patient in no acute distress. Alert and oriented x3 HEENT:within normal limits Cardiac: Regular rate and rhythm Pulmonary: Lungs clear to auscultation Abdomen: Soft and nontender.  Normal active bowel sounds  Musculoskeletal: (Left knee: Painful range of motion.  Limited range of motion.  Trace effusion.  No instability. Labs: Recent Results (from the past 2160 hour(s))  APTT     Status: None   Collection Time: 05/06/16  4:42 PM  Result Value Ref Range   aPTT 34 24 - 37 seconds  CBC  WITH DIFFERENTIAL     Status: Abnormal   Collection Time: 05/06/16  4:42 PM  Result Value Ref Range   WBC 12.1 (H) 4.0 - 10.5 K/uL   RBC 4.62 3.87 - 5.11 MIL/uL   Hemoglobin 14.0 12.0 - 15.0 g/dL   HCT 42.9 36.0 - 46.0 %   MCV 92.9 78.0 - 100.0 fL   MCH 30.3 26.0 - 34.0 pg   MCHC 32.6 30.0 - 36.0 g/dL   RDW 12.9 11.5 - 15.5 %   Platelets 390 150 - 400 K/uL   Neutrophils Relative % 64 %   Neutro Abs 7.7 1.7 - 7.7 K/uL   Lymphocytes Relative 27 %   Lymphs Abs 3.2 0.7 - 4.0 K/uL   Monocytes Relative 7 %   Monocytes Absolute 0.9 0.1 - 1.0 K/uL   Eosinophils Relative 2 %   Eosinophils Absolute 0.3 0.0 - 0.7 K/uL   Basophils Relative 0 %   Basophils Absolute 0.0 0.0 - 0.1 K/uL  Comprehensive metabolic panel     Status: Abnormal   Collection Time: 05/06/16  4:42 PM  Result Value Ref Range   Sodium 137 135 - 145 mmol/L   Potassium 4.2 3.5 - 5.1 mmol/L   Chloride 102 101 - 111 mmol/L   CO2 27 22 - 32 mmol/L   Glucose, Bld 101 (H) 65 - 99 mg/dL   BUN 13 6 - 20 mg/dL   Creatinine, Ser 0.59 0.44 - 1.00 mg/dL   Calcium 9.9 8.9 - 10.3 mg/dL   Total Protein 7.1 6.5 - 8.1 g/dL   Albumin 3.9 3.5 - 5.0 g/dL   AST 17 15 - 41 U/L   ALT 18 14 - 54 U/L   Alkaline Phosphatase 123 38 - 126 U/L   Total Bilirubin 0.4 0.3 - 1.2 mg/dL   GFR calc non Af Amer >60 >60 mL/min   GFR calc Af Amer >60 >60 mL/min    Comment: (NOTE) The eGFR has been calculated using the CKD EPI equation. This calculation has not been validated in all clinical situations. eGFR's persistently <60 mL/min signify possible Chronic Kidney Disease.    Anion gap 8 5 - 15  Protime-INR     Status: None   Collection Time: 05/06/16  4:42 PM  Result Value Ref Range   Prothrombin Time 14.5 11.6 - 15.2 seconds   INR 1.11 0.00 - 1.49  Urinalysis, Routine w reflex microscopic (not at [facility])     Status: None   Collection Time: 05/06/16  4:42 PM  Result Value Ref Range   Color, Urine YELLOW YELLOW   APPearance CLEAR CLEAR    Specific Gravity, Urine 1.014 1.005 - 1.030   pH 6.5 5.0 - 8.0   Glucose, UA NEGATIVE NEGATIVE mg/dL   Hgb urine dipstick NEGATIVE NEGATIVE   Bilirubin Urine NEGATIVE NEGATIVE   Ketones, ur NEGATIVE NEGATIVE mg/dL   Protein, ur NEGATIVE NEGATIVE mg/dL   Nitrite NEGATIVE NEGATIVE   Leukocytes, UA  NEGATIVE NEGATIVE    Comment: MICROSCOPIC NOT DONE ON URINES WITH NEGATIVE PROTEIN, BLOOD, LEUKOCYTES, NITRITE, OR GLUCOSE <1000 mg/dL.  Surgical pcr screen     Status: Abnormal   Collection Time: 05/06/16  4:43 PM  Result Value Ref Range   MRSA, PCR NEGATIVE NEGATIVE   Staphylococcus aureus POSITIVE (A) NEGATIVE    Comment:        The Xpert SA Assay (FDA approved for NASAL specimens in patients over 52 years of age), is one component of a comprehensive surveillance program.  Test performance has been validated by [facility] for patients greater than or equal to 6 year old. It is not intended to diagnose infection nor to guide or monitor treatment.     Estimated body mass index is 41.29 kg/(m^2) as calculated from the following:   Height as of 05/09/15: 5' 2"  (1.575 m).   Weight as of 04/29/15: 102.422 kg (225 lb 12.8 oz).   Imaging Review Plain radiographs demonstrate severe degenerative joint disease of the left knee(s). The overall alignment ismild varus. The bone quality appears to be fair for age and reported activity level.  Assessment/Plan:  End stage arthritis, left knee   The patient history, physical examination, clinical judgment of the provider and imaging studies are consistent with end stage degenerative joint disease of the left knee(s) and total knee arthroplasty is deemed medically necessary. The treatment options including medical management, injection therapy arthroscopy and arthroplasty were discussed at length. The risks and benefits of total knee arthroplasty were presented and reviewed. The risks due to aseptic loosening, infection, stiffness, patella tracking  problems, thromboembolic complications and other imponderables were discussed. The patient acknowledged the explanation, agreed to proceed with the plan and consent was signed. Patient is being admitted for inpatient treatment for surgery, pain control, PT, OT, prophylactic antibiotics, VTE prophylaxis, progressive ambulation and ADL's and discharge planning. The patient is planning to be discharged home with home health services

## 2016-05-17 NOTE — Anesthesia Preprocedure Evaluation (Signed)
Anesthesia Evaluation  Patient identified by MRN, date of birth, ID band Patient awake    Reviewed: Allergy & Precautions, NPO status , Patient's Chart, lab work & pertinent test results  Airway Mallampati: II  TM Distance: >3 FB Neck ROM: Full    Dental  (+) Teeth Intact   Pulmonary sleep apnea , COPD, Current Smoker,    Pulmonary exam normal breath sounds clear to auscultation       Cardiovascular hypertension, Pt. on medications Normal cardiovascular exam+ Valvular Problems/Murmurs  Rhythm:Regular Rate:Normal     Neuro/Psych negative neurological ROS  negative psych ROS   GI/Hepatic negative GI ROS, Neg liver ROS,   Endo/Other  Morbid obesity  Renal/GU negative Renal ROS     Musculoskeletal  (+) Arthritis , Fibromyalgia -  Abdominal (+) + obese,   Peds  Hematology negative hematology ROS (+)   Anesthesia Other Findings   Reproductive/Obstetrics negative OB ROS                             Anesthesia Physical  Anesthesia Plan  ASA: III  Anesthesia Plan:    Post-op Pain Management:    Induction: Intravenous  Airway Management Planned:   Additional Equipment:   Intra-op Plan:   Post-operative Plan:   Informed Consent: I have reviewed the patients History and Physical, chart, labs and discussed the procedure including the risks, benefits and alternatives for the proposed anesthesia with the patient or authorized representative who has indicated his/her understanding and acceptance.   Dental advisory given  Plan Discussed with: CRNA  Anesthesia Plan Comments:         Anesthesia Quick Evaluation

## 2016-05-17 NOTE — Brief Op Note (Signed)
05/17/2016  4:32 PM  PATIENT:  [name]  50 y.o. female  PRE-OPERATIVE DIAGNOSIS:  OSTEOARTHRITIS LEFT KNEE  POST-OPERATIVE DIAGNOSIS:  OSTEOARTHRITIS LEFT KNEE  PROCEDURE:  Procedure(s): TOTAL KNEE ARTHROPLASTY (Left) SHOULDER INJECTION (Right)  SURGEON:  Surgeon(s) and Role:    * Dorna Leitz, MD - Primary  PHYSICIAN ASSISTANT:   ASSISTANTS: bethune   ANESTHESIA:   general  EBL:  Total I/O In: 1000 [I.V.:1000] Out: 230 [Urine:200; Blood:30]  BLOOD ADMINISTERED:none  DRAINS: none   LOCAL MEDICATIONS USED:  MARCAINE     SPECIMEN:  No Specimen  DISPOSITION OF SPECIMEN:  N/A  COUNTS:  YES  TOURNIQUET:   Total Tourniquet Time Documented: Thigh (Left) - 57 minutes Total: Thigh (Left) - 57 minutes   DICTATION: .Other Dictation: Dictation Number [id number]  PLAN OF CARE: Admit to inpatient   PATIENT DISPOSITION:  PACU - hemodynamically stable.   Delay start of Pharmacological VTE agent (>24hrs) due to surgical blood loss or risk of bleeding: no

## 2016-05-18 ENCOUNTER — Encounter (HOSPITAL_COMMUNITY): Payer: Self-pay | Admitting: Orthopedic Surgery

## 2016-05-18 LAB — BASIC METABOLIC PANEL
Anion gap: 8 (ref 5–15)
BUN: 9 mg/dL (ref 6–20)
CO2: 28 mmol/L (ref 22–32)
Calcium: 9.1 mg/dL (ref 8.9–10.3)
Chloride: 99 mmol/L — ABNORMAL LOW (ref 101–111)
Creatinine, Ser: 0.47 mg/dL (ref 0.44–1.00)
GFR calc Af Amer: >60 mL/min (ref >60)
GFR calc non Af Amer: >60 mL/min (ref >60)
Glucose, Bld: 150 mg/dL — ABNORMAL HIGH (ref 65–99)
Potassium: 4.4 mmol/L (ref 3.5–5.1)
Sodium: 135 mmol/L (ref 135–145)

## 2016-05-18 LAB — CBC
HCT: 39.9 % (ref 36.0–46.0)
Hemoglobin: 12.8 g/dL (ref 12.0–15.0)
MCH: 30.3 pg (ref 26.0–34.0)
MCHC: 32.1 g/dL (ref 30.0–36.0)
MCV: 94.3 fL (ref 78.0–100.0)
Platelets: 349 10*3/uL (ref 150–400)
RBC: 4.23 MIL/uL (ref 3.87–5.11)
RDW: 12.9 % (ref 11.5–15.5)
WBC: 13.1 10*3/uL — ABNORMAL HIGH (ref 4.0–10.5)

## 2016-05-18 NOTE — Progress Notes (Signed)
Orthopedic Tech Progress Note Patient Details:  [name] [birth year] [id number]  CPM Left Knee CPM Left Knee: On Left Knee Flexion (Degrees): 40 Left Knee Extension (Degrees): 0   [name] 05/18/2016, 9:14 AM

## 2016-05-18 NOTE — Progress Notes (Signed)
Physical Therapy Treatment Patient Details Name: [name] MRN: [id number] DOB: 13-Aug-1966 Today's Date: 05/18/2016    History of Present Illness pt presents after L TKR.  pt with hx of L total shoulder, R shoulder fx with surgeries x2, COPD, Fibromyalgia, and HTN.      PT Comments    Pt performed increased mobility.  Pt remains guarded secondary to pain.  Pt perseverates on home problems and required cues to refocus on task at hand.  Follow Up Recommendations  Home health PT;Supervision/Assistance - 24 hour     Equipment Recommendations  Rolling walker with 5" wheels    Recommendations for Other Services       Precautions / Restrictions Precautions Precautions: Fall;Knee Precaution Comments: Discussed positioning of knee.  Watch O2 sats.   Required Braces or Orthoses: Knee Immobilizer - Left Knee Immobilizer - Left: On when out of bed or walking Restrictions Weight Bearing Restrictions: Yes LLE Weight Bearing: Weight bearing as tolerated    Mobility  Bed Mobility Overal bed mobility: Needs Assistance Bed Mobility: Supine to Sit;Sit to Supine     Supine to sit: Min assist;HOB elevated Sit to supine: Min assist   General bed mobility comments: A with L LE in/out of bed and with repositining it in bed.  Pt is guarded and requires assist only due to pain.    Transfers Overall transfer level: Needs assistance Equipment used: Rolling walker (2 wheeled) Transfers: Sit to/from Stand Sit to Stand: Min assist;Min guard         General transfer comment: MinA from bed and MinG from toilet with use of grab bar.  cues for UE use and positioning LEs with transfers.    Ambulation/Gait Ambulation/Gait assistance: Min guard Ambulation Distance (Feet): 140 Feet Assistive device: Rolling walker (2 wheeled) Gait Pattern/deviations: Step-to pattern;Decreased step length - right;Decreased stance time - left;Decreased stride length;Trunk flexed Gait velocity: slow Gait  velocity interpretation: Below normal speed for age/gender General Gait Details: cues for more equal step lengths and positioning within RW.  pt ambulation limited by pain in L knee.  Cues for L heel strike.     Stairs            Wheelchair Mobility    Modified Rankin (Stroke Patients Only)       Balance Overall balance assessment: Needs assistance   Sitting balance-Leahy Scale: Fair Sitting balance - Comments: Tends to use UEs more for pain than balance.       Standing balance-Leahy Scale: Fair                      Cognition Arousal/Alertness: Awake/alert Behavior During Therapy: WFL for tasks assessed/performed Overall Cognitive Status: Within Functional Limits for tasks assessed                      Exercises Total Joint Exercises Ankle Circles/Pumps: AROM;Left;10 reps;Supine Quad Sets: AROM;Left;10 reps;Supine Heel Slides: AAROM;Left;10 reps;Supine Hip ABduction/ADduction: AAROM;Left;10 reps;Supine Straight Leg Raises: AAROM;Left;10 reps;Supine    General Comments        Pertinent Vitals/Pain Pain Assessment: 0-10 Pain Score: 10-Worst pain ever Pain Location: L knee Pain Descriptors / Indicators: Aching;Grimacing;Guarding Pain Intervention(s): Monitored during session;Repositioned    Home Living                      Prior Function            PT Goals (current goals can now be found in  the care plan section) Acute Rehab PT Goals Patient Stated Goal: Home Potential to Achieve Goals: Good Progress towards PT goals: Progressing toward goals    Frequency  7X/week    PT Plan Current plan remains appropriate    Co-evaluation             End of Session Equipment Utilized During Treatment: Gait belt Activity Tolerance: Patient limited by pain Patient left: in bed;with call bell/phone within reach;in CPM     Time: [id number] PT Time Calculation (min) (ACUTE ONLY): 46 min  Charges:  $Gait Training: 8-22  mins $Therapeutic Exercise: 8-22 mins $Therapeutic Activity: 8-22 mins                    G Codes:      Cristela Blue 20-May-2016, 3:22 PM  Governor Rooks, PTA pager [phone]

## 2016-05-18 NOTE — Progress Notes (Signed)
Subjective: 1 Day Post-Op Procedure(s) (LRB): TOTAL KNEE ARTHROPLASTY (Left) SHOULDER INJECTION (Right) Patient reports pain as moderate.    Objective: Vital signs in last 24 hours: Temp:  [97.5 F (36.4 C)-98.8 F (37.1 C)] 98.8 F (37.1 C) (07/11 0451) Pulse Rate:  [92-114] 113 (07/11 0451) Resp:  [12-21] 19 (07/11 0451) BP: (98-127)/(44-73) 105/73 mmHg (07/11 0451) SpO2:  [86 %-96 %] 93 % (07/11 0451) FiO2 (%):  [3 %] 3 % (07/10 1835) Weight:  [104.327 kg (230 lb)] 104.327 kg (230 lb) (07/10 1307)  Intake/Output from previous day: 07/10 0701 - 07/11 0700 In: 2320 [P.O.:960; I.V.:1200; IV Piggyback:160] Out: 1680 [Urine:1650; Blood:30] Intake/Output this shift:     Recent Labs  05/18/16 0411  HGB 12.8    Recent Labs  05/18/16 0411  WBC 13.1*  RBC 4.23  HCT 39.9  PLT 349    Recent Labs  05/18/16 0411  NA 135  K 4.4  CL 99*  CO2 28  BUN 9  CREATININE 0.47  GLUCOSE 150*  CALCIUM 9.1   No results for input(s): LABPT, INR in the last 72 hours.  Neurologically intact ABD soft Neurovascular intact Sensation intact distally Intact pulses distally No cellulitis present Compartment soft  Assessment/Plan: 1 Day Post-Op Procedure(s) (LRB): TOTAL KNEE ARTHROPLASTY (Left) SHOULDER INJECTION (Right) Advance diet Up with therapy Plan for discharge tomorrow  [name] L 05/18/2016, 8:04 AM

## 2016-05-18 NOTE — Evaluation (Signed)
Physical Therapy Evaluation Patient Details Name: [name] MRN: [id number] DOB: 12-19-1965 Today's Date: 05/18/2016   History of Present Illness  pt presents after L TKR.  pt with hx of L total shoulder, R shoulder fx with surgeries x2, COPD, Fibromyalgia, and HTN.    Clinical Impression  Pt very painful with any movement of L knee despite having pain meds.  Pt indicates she had just gotten into CPM prior to PT arrival and so pt was returned to CPM at end of session.  Pt on 4L O2 throughout session and O2 sats remained low to mid 90's.  Pt does not wear Home O2 and was wondering if she will continue to need O2.  Feel pt will continue to progress to be able to return to home with niece's support.  Will continue to follow while on acute.      Follow Up Recommendations Home health PT;Supervision/Assistance - 24 hour    Equipment Recommendations  Rolling walker with 5" wheels    Recommendations for Other Services       Precautions / Restrictions Precautions Precautions: Fall;Knee Precaution Comments: Discussed positioning of knee.  Watch O2 sats.   Required Braces or Orthoses: Knee Immobilizer - Left Knee Immobilizer - Left: On when out of bed or walking Restrictions Weight Bearing Restrictions: Yes LLE Weight Bearing: Weight bearing as tolerated      Mobility  Bed Mobility Overal bed mobility: Needs Assistance Bed Mobility: Supine to Sit;Sit to Supine     Supine to sit: Min assist;HOB elevated Sit to supine: Min assist   General bed mobility comments: A with L LE in/out of bed and with repositining it in bed.    Transfers Overall transfer level: Needs assistance Equipment used: Rolling walker (2 wheeled) Transfers: Sit to/from Stand Sit to Stand: Min assist;Min guard         General transfer comment: MinA from bed and MinG from toilet with use of grab bar.  cues for UE use and positioning LEs with transfers.    Ambulation/Gait Ambulation/Gait assistance: Min  guard Ambulation Distance (Feet): 70 Feet Assistive device: Rolling walker (2 wheeled) Gait Pattern/deviations: Step-to pattern;Decreased step length - right;Decreased stance time - left;Decreased stride length     General Gait Details: cues for more equal step lengths and positioning within RW.  pt ambulation limited by pain in L knee.    Stairs            Wheelchair Mobility    Modified Rankin (Stroke Patients Only)       Balance Overall balance assessment: Needs assistance Sitting-balance support: Single extremity supported;No upper extremity supported;Feet supported Sitting balance-Leahy Scale: Fair Sitting balance - Comments: Tends to use UEs more for pain than balance.     Standing balance support: Bilateral upper extremity supported;No upper extremity supported;During functional activity Standing balance-Leahy Scale: Fair Standing balance comment: pt able to stand at sink for hand hygiene, but leans against sink for support.                               Pertinent Vitals/Pain Pain Assessment: 0-10 Pain Score: 8  Pain Location: L knee Pain Descriptors / Indicators: Aching;Grimacing;Guarding;Sharp Pain Intervention(s): Monitored during session;Premedicated before session;Repositioned;Limited activity within patient's tolerance    Home Living Family/patient expects to be discharged to:: Private residence Living Arrangements: Alone Available Help at Discharge: Family;Available 24 hours/day (Niece and her boyfriend able to stay for a few weeks.  )  Type of Home: House Home Access: Stairs to enter Entrance Stairs-Rails: Right;Left;Can reach both (at front) Entrance Stairs-Number of Steps: 5 (at front with rails. 3 at back no rails) Home Layout: One level Home Equipment: Walker - 4 wheels;Crutches;Shower seat Additional Comments: pt states someone might have a RW she can try to borrow.      Prior Function Level of Independence: Independent                Hand Dominance        Extremity/Trunk Assessment   Upper Extremity Assessment: Overall WFL for tasks assessed           Lower Extremity Assessment: LLE deficits/detail   LLE Deficits / Details: AAROM ~10 - 40.  pt very painful limiting ROM and overall strength.  Sensation intact.    Cervical / Trunk Assessment: Normal  Communication   Communication: No difficulties  Cognition Arousal/Alertness: Awake/alert Behavior During Therapy: WFL for tasks assessed/performed Overall Cognitive Status: Within Functional Limits for tasks assessed                      General Comments      Exercises        Assessment/Plan    PT Assessment Patient needs continued PT services  PT Diagnosis Abnormality of gait;Acute pain   PT Problem List Decreased strength;Decreased range of motion;Decreased activity tolerance;Decreased balance;Decreased mobility;Decreased knowledge of use of DME;Cardiopulmonary status limiting activity;Obesity;Pain  PT Treatment Interventions DME instruction;Gait training;Stair training;Functional mobility training;Therapeutic activities;Therapeutic exercise;Balance training;Patient/family education   PT Goals (Current goals can be found in the Care Plan section) Acute Rehab PT Goals Patient Stated Goal: Home PT Goal Formulation: With patient Time For Goal Achievement: 05/25/16 Potential to Achieve Goals: Good    Frequency 7X/week   Barriers to discharge        Co-evaluation               End of Session Equipment Utilized During Treatment: Gait belt;Oxygen Activity Tolerance: Patient limited by pain Patient left: in bed;with call bell/phone within reach;in CPM Nurse Communication: Mobility status         Time: [id number] PT Time Calculation (min) (ACUTE ONLY): 43 min   Charges:   PT Evaluation $PT Eval Moderate Complexity: 1 Procedure PT Treatments $Gait Training: 8-22 mins $Therapeutic Activity: 8-22 mins   PT G CodesCatarina Hartshorn, [place] 05/18/2016, 10:38 AM

## 2016-05-18 NOTE — Op Note (Addendum)
[name], [name] NO.:  [id number]  MEDICAL RECORD NO.:  [id number]  LOCATION:  [id number]                        FACILITY:  [facility]  PHYSICIAN:  Alta Corning, M.D.   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  PROCEDURES: 1. Left total knee replacement with a Sigma system, size 2.5 MBT     revision style tray, size 2.5 femur, 15-mm bridging bearing, and a     35-mm all polyethylene patella. 2. Lateral retinacular release. 3. Sub acromial injection r shoulder 6cc marcaine and 2cc 80mg percc depomedrol  SURGEON:  Alta Corning, M.D.  ASSISTANT:  Gary Fleet, P.A.  ANESTHESIA:  General.  BRIEF HISTORY:  [name] is a 50 year old female with a long history of significant complaints of bilateral knee pain, left greater than right.  X-ray showed bone-on-bone change.  She was having night pain and light activity pain and after failure of all conservative care including injection therapy, activity modification, and physical therapy, she was taken to the operating room for total knee replacement. The patient was having significant right shoulder pain.  She has had significant impingement findings and has failed conservative treatment over the last several months.  We elected to inject her right shoulder because of her long-standing impingement and failure of conservative care including physical therapy and medication. DESCRIPTION OF PROCEDURE:  The patient was taken to the operating room. After adequate anesthesia was obtained with general anesthetic, the patient was placed supine on the operating table.  Left leg was prepped and draped in usual sterile fashion.   Following this, the leg was exsanguinated.  Blood pressure tourniquet was inflated to 300 mmHg.  At this point, a midline incision was made in the subcutaneous tissue down the level of the extensor mechanism and a medial parapatellar arthrotomy was undertaken.  Once this was done, attention was turned to the removal of the synovium on the anterior aspect of the femur, retropatellar fat pad, anterior, posterior, cruciates, and the medial and lateral meniscus.  Once these structures were removed, attention was turned to the femur where an intramedullary pilot hole was drilled with a 5-degree valgus inclination.  10 mm of distal bone was resected off the distal femur.  Attention was then turned to the tibia, which was cut perpendicular to its long axis and the bone was then removed.  The tibia was then sized to a 2.5.  So, we had trial hook for an MBT revision style tray because of her large size and premenopausal status.  At this point, we took the MBT revision tray and went ahead and sized it and then rotationally aligned it and then drilled and keeled it.  Once this was done, attention was turned towards placing a trial tibial component and a trial femoral component and a 12.5-mm bridging bearing was trialed and a 35 trial patella was put in place.  Knee put through a range of motion and excellent  stability, range of motion was achieved.  The patella was prepared with a patellar clamp down to 13 mm of bone and lugs were drilled for the 35 patellar button prior to placement of the trial component.  Once all the trial components were placed, the knee was put through a range of motion with excellent stability was achieved. There was concerned a little bit about the lateral patellar tracking and tightness in the patellofemoral area and at this point, a lateral retinacular release was performed.  Once this was done, the excellent tracking was now obtained and reasonable alignment.  At this  point, all the trial components were removed and the final components were cemented into place.  The final components were a size 2.5 MBT style revision tray, 2.5-mm femoral component, 12.5-mm bridging bearing trial was placed and a 35 all poly patella was placed and held with a clamp.  All excess bone cement was removed.  The tourniquet was let down once the cement was completely hardened.  At this point, we trialed a 15 excellent stability, little better feel in mid-flexion and flexion so at that point, we went with a 15 poly.  We did mixture of Exparel, Marcaine and saline all around the synovial reflection giving an excellent block of the synovial reflection.  Once this was done and the final poly been placed, the medial parapatellar arthrotomy was closed with 1 Vicryl running, the skin with 0 and 2-0 Vicryl, and 3-0 Monocryl subcuticular. Benzoin and Steri-Strips were applied.  Sterile compressive dressing was applied. At this point attention was turned to the right shoulder where a subacromial injection was performed.  She was injected with 6 cc Marcaine and 2 cc 80 mg/cc Depo-Medrol.  This was done under sterile conditions and a compressive Band-Aid was applied. The patient was then taken to the recovery room, she was noted to be in satisfactory condition.  Estimated blood loss for the procedure was minimal.     Alta Corning, M.D.     Corliss Skains  D:  05/17/2016  T:  05/18/2016  Job:  [id number]

## 2016-05-19 LAB — CBC
HCT: 36.1 % (ref 36.0–46.0)
Hemoglobin: 11.8 g/dL — ABNORMAL LOW (ref 12.0–15.0)
MCH: 30.7 pg (ref 26.0–34.0)
MCHC: 32.7 g/dL (ref 30.0–36.0)
MCV: 94 fL (ref 78.0–100.0)
Platelets: 329 10*3/uL (ref 150–400)
RBC: 3.84 MIL/uL — ABNORMAL LOW (ref 3.87–5.11)
RDW: 12.8 % (ref 11.5–15.5)
WBC: 22.1 10*3/uL — ABNORMAL HIGH (ref 4.0–10.5)

## 2016-05-19 NOTE — Progress Notes (Signed)
Orthopedic Tech Progress Note Patient Details:  [name] 1966/04/10 [id number]  Patient ID: [name], female   DOB: Oct 30, 1966, 50 y.o.   MRN: [id number] Placed pt's lle on cpm @0 -50 degrees @1345   Hildred Priest 05/19/2016, 1:41 PM

## 2016-05-19 NOTE — Progress Notes (Signed)
Physical Therapy Treatment Patient Details Name: [name] MRN: [id number] DOB: [birth year] Today's Date: 05/19/2016    History of Present Illness pt presents after L TKR.  pt with hx of L total shoulder, R shoulder fx with surgeries x2, COPD, Fibromyalgia, and HTN.      PT Comments    Pt performed with increased mobility, reviewed stair training and HEP.  Pt ready for d/c from PT stand point.    Follow Up Recommendations  Home health PT;Supervision/Assistance - 24 hour     Equipment Recommendations       Recommendations for Other Services       Precautions / Restrictions Precautions Precautions: Fall;Knee Precaution Comments: Discussed positioning of knee.  Watch O2 sats.   Required Braces or Orthoses: Knee Immobilizer - Left Knee Immobilizer - Left: On when out of bed or walking Restrictions Weight Bearing Restrictions: Yes LLE Weight Bearing: Weight bearing as tolerated    Mobility  Bed Mobility Overal bed mobility: Modified Independent Bed Mobility: Supine to Sit;Sit to Supine     Supine to sit: HOB elevated;Modified independent (Device/Increase time)     General bed mobility comments: Pt presents with less pain during mobility and performed technique with cues and presents with ability to advance LE.    Transfers Overall transfer level: Needs assistance Equipment used: Rolling walker (2 wheeled) Transfers: Sit to/from Stand Sit to Stand: Supervision         General transfer comment: Cues for sequencing and hand placement, transfers much improved from previous session.    Ambulation/Gait Ambulation/Gait assistance: Min guard Ambulation Distance (Feet): 300 Feet Assistive device: Rolling walker (2 wheeled) Gait Pattern/deviations: Step-through pattern;Antalgic;Trunk flexed     General Gait Details: Pt presents with improved cadence during PT session. Pt presents with improved endurance and required cues for sequencing to improve gait pattern.      Stairs Stairs: Yes Stairs assistance: Supervision Stair Management: Step to pattern;Two rails Number of Stairs: 5 General stair comments: Cues for sequencing and hand placement.  pt performed forward to ascend and descend stairs.    Wheelchair Mobility    Modified Rankin (Stroke Patients Only)       Balance Overall balance assessment: Needs assistance   Sitting balance-Leahy Scale: Fair Sitting balance - Comments: Tends to use UEs more for pain than balance.       Standing balance-Leahy Scale: Fair                      Cognition Arousal/Alertness: Awake/alert Behavior During Therapy: WFL for tasks assessed/performed Overall Cognitive Status: Within Functional Limits for tasks assessed                      Exercises Total Joint Exercises Ankle Circles/Pumps: AROM;Left;10 reps;Supine Quad Sets: AROM;Left;10 reps;Supine Towel Squeeze: AROM;Left;10 reps;Supine Short Arc Quad: AROM;Left;10 reps;Supine Heel Slides: AAROM;Left;10 reps;Supine Hip ABduction/ADduction: AAROM;Left;Supine;10 reps Straight Leg Raises: AAROM;Left;10 reps;Supine Long Arc Quad: AROM;Left;10 reps;Seated Knee Flexion: AROM;AAROM;Left;10 reps;Seated (1x5 AROM and 1x5 AAROM.  ) Goniometric ROM: 5-62 degrees.      General Comments        Pertinent Vitals/Pain Pain Assessment: 0-10 Pain Score: 10-Worst pain ever Pain Descriptors / Indicators: Crying;Discomfort;Grimacing;Guarding;Tightness Pain Intervention(s): Monitored during session;Repositioned    Home Living                      Prior Function            PT Goals (current  goals can now be found in the care plan section) Acute Rehab PT Goals Patient Stated Goal: Home Potential to Achieve Goals: Good Progress towards PT goals: Progressing toward goals    Frequency  7X/week    PT Plan Current plan remains appropriate    Co-evaluation             End of Session Equipment Utilized During  Treatment: Gait belt Activity Tolerance: Patient limited by pain Patient left: in bed;with call bell/phone within reach     Time: 1006-1041 PT Time Calculation (min) (ACUTE ONLY): 35 min  Charges:  $Gait Training: 8-22 mins $Therapeutic Exercise: 8-22 mins                    G Codes:      Cristela Blue 05-29-2016, 4:01 PM  Governor Rooks, PTA pager [phone]

## 2016-05-19 NOTE — Progress Notes (Signed)
Subjective: 2 Days Post-Op Procedure(s) (LRB): TOTAL KNEE ARTHROPLASTY (Left) SHOULDER INJECTION (Right) Patient reports pain as moderate.    Objective: Vital signs in last 24 hours: Temp:  [97.7 F (36.5 C)-99 F (37.2 C)] 97.8 F (36.6 C) (07/12 0523) Pulse Rate:  [103-122] 103 (07/12 0523) Resp:  [14-18] 14 (07/12 0523) BP: (110-128)/(63-89) 128/89 mmHg (07/12 0523) SpO2:  [93 %-96 %] 94 % (07/12 0523)  Intake/Output from previous day: 07/11 0701 - 07/12 0700 In: 720 [P.O.:720] Out: 2050 [Urine:2050] Intake/Output this shift:     Recent Labs  05/18/16 0411 05/19/16 0448  HGB 12.8 11.8*    Recent Labs  05/18/16 0411 05/19/16 0448  WBC 13.1* 22.1*  RBC 4.23 3.84*  HCT 39.9 36.1  PLT 349 329    Recent Labs  05/18/16 0411  NA 135  K 4.4  CL 99*  CO2 28  BUN 9  CREATININE 0.47  GLUCOSE 150*  CALCIUM 9.1   No results for input(s): LABPT, INR in the last 72 hours.  Neurologically intact ABD soft Neurovascular intact Sensation intact distally Intact pulses distally Dorsiflexion/Plantar flexion intact No cellulitis present Compartment soft  Assessment/Plan: 2 Days Post-Op Procedure(s) (LRB): TOTAL KNEE ARTHROPLASTY (Left) SHOULDER INJECTION (Right) Advance diet Up with therapy Discharge home with home health after therapy today  Shaylyn Bawa L 05/19/2016, 8:04 AM

## 2016-05-19 NOTE — Anesthesia Postprocedure Evaluation (Signed)
Anesthesia Post Note  Patient: Genoveva Ill [place]  Procedure(s) Performed: Procedure(s) (LRB): TOTAL KNEE ARTHROPLASTY (Left) SHOULDER INJECTION (Right)  Patient location during evaluation: PACU Anesthesia Type: Spinal and General Level of consciousness: awake and alert Pain management: pain level controlled Vital Signs Assessment: post-procedure vital signs reviewed and stable Respiratory status: spontaneous breathing and respiratory function stable Cardiovascular status: blood pressure returned to baseline and stable Postop Assessment: spinal receding Anesthetic complications: no    Last Vitals:  Filed Vitals:   05/18/16 2015 05/19/16 0523  BP: 126/65 128/89  Pulse: 122 103  Temp: 37.2 C 36.6 C  Resp: 17 14    Last Pain:  Filed Vitals:   05/19/16 0720  PainSc: [place]

## 2016-05-19 NOTE — Care Management Note (Signed)
Case Management Note  Patient Details  Name: [name] MRN: [id number] Date of Birth: 05/13/1966  Subjective/Objective:     50 yr old female s/p left total knee arthroplasty, also had right shoulder injection.   Action/Plan:  Patient was preoperatively setup with [facility] for therapy, no changes. Patient will have family support at discharge. DME has been delivered to patient's room, CPM delivered to her home.    Expected Discharge Date:  05/19/16               Expected Discharge Plan:  [place]  In-House Referral:     Discharge planning Services  CM Consult  Post Acute Care Choice:  Home Health, Durable Medical Equipment Choice offered to:  Patient  DME Arranged:  3-N-1, Walker rolling, CPM DME Agency:  [employer]  HH Arranged:  PT [place]:  [place]  Status of Service:  Completed, signed off  If discussed at [place] of Stay Meetings, dates discussed:    Additional Comments:  Ninfa Meeker, RN 05/19/2016, 11:00 AM

## 2016-05-19 NOTE — Progress Notes (Signed)
Pt received her walker for discharge and states that she has a 3-1 at home and she dose not need one to deliver to the pt room. She finished the second session of PT and ready to discharge.

## 2016-05-28 NOTE — Discharge Summary (Signed)
Patient ID: [name] MRN: [id number] DOB/AGE: 1966/01/23 50 y.o.  Admit date: 05/17/2016 Discharge date: 05/19/2016  Admission Diagnoses:  Principal Problem:   Primary osteoarthritis of left knee Active Problems:   Tendinitis of right rotator cuff   Discharge Diagnoses:  Same  Past Medical History  Diagnosis Date  . COPD (chronic obstructive pulmonary disease) ([place])   . Arthritis   . Fibromyalgia   . Hypertension   . Sleep apnea     study done approx. 10 yrs., told that it was negative, but just pulse ox eval. & told that she needs O2 at night     Surgeries: Procedure(s):Left TOTAL KNEE ARTHROPLASTY SHOULDER INJECTION on 05/17/2016 right side   Discharged Condition: Improved  Hospital Course: [name] is an 49 y.o. female who was admitted 05/17/2016 for operative treatment ofPrimary osteoarthritis of left knee. She also had right shoulder rotator cuff tendinitis and requested an injection of her right shoulder while she was under anesthesia. Patient has severe unremitting pain that affects sleep, daily activities, and work/hobbies. After pre-op clearance the patient was taken to the operating room on 05/17/2016 and underwent  Procedure(s): Left TOTAL KNEE ARTHROPLASTY SHOULDER INJECTION. Right  Patient was given perioperative antibiotics:  Anti-infectives    Start     Dose/Rate Route Frequency Ordered Stop   05/17/16 2030  clindamycin (CLEOCIN) IVPB 600 mg     600 mg 100 mL/hr over 30 Minutes Intravenous Every 6 hours 05/17/16 1858 05/18/16 0135   05/17/16 1400  clindamycin (CLEOCIN) IVPB 900 mg     900 mg 100 mL/hr over 30 Minutes Intravenous To [facility]Stay Surgical 05/14/16 0730 05/17/16 1502       Patient was given sequential compression devices, early ambulation, and chemoprophylaxis to prevent DVT.The the patient had significant pain on postoperative day #1. It was managed with oral pain medication. She was progressing with physical therapy and on postop  day #2 was afebrile, her vital signs are stable. He was taking oral pain medication. She was progressing very nicely with physical therapy at that time.  Patient benefited maximally from hospital stay and there were no complications.    Recent vital signs: See hospital chart for details   Recent laboratory studies:  Recent Results (from the past 2160 hour(s))  APTT     Status: None   Collection Time: 05/06/16  4:42 PM  Result Value Ref Range   aPTT 34 24 - 37 seconds  CBC WITH DIFFERENTIAL     Status: Abnormal   Collection Time: 05/06/16  4:42 PM  Result Value Ref Range   WBC 12.1 (H) 4.0 - 10.5 K/uL   RBC 4.62 3.87 - 5.11 MIL/uL   Hemoglobin 14.0 12.0 - 15.0 g/dL   HCT 42.9 36.0 - 46.0 %   MCV 92.9 78.0 - 100.0 fL   MCH 30.3 26.0 - 34.0 pg   MCHC 32.6 30.0 - 36.0 g/dL   RDW 12.9 11.5 - 15.5 %   Platelets 390 150 - 400 K/uL   Neutrophils Relative % 64 %   Neutro Abs 7.7 1.7 - 7.7 K/uL   Lymphocytes Relative 27 %   Lymphs Abs 3.2 0.7 - 4.0 K/uL   Monocytes Relative 7 %   Monocytes Absolute 0.9 0.1 - 1.0 K/uL   Eosinophils Relative 2 %   Eosinophils Absolute 0.3 0.0 - 0.7 K/uL   Basophils Relative 0 %   Basophils Absolute 0.0 0.0 - 0.1 K/uL  Comprehensive metabolic panel  Status: Abnormal   Collection Time: 05/06/16  4:42 PM  Result Value Ref Range   Sodium 137 135 - 145 mmol/L   Potassium 4.2 3.5 - 5.1 mmol/L   Chloride 102 101 - 111 mmol/L   CO2 27 22 - 32 mmol/L   Glucose, Bld 101 (H) 65 - 99 mg/dL   BUN 13 6 - 20 mg/dL   Creatinine, Ser 0.59 0.44 - 1.00 mg/dL   Calcium 9.9 8.9 - 10.3 mg/dL   Total Protein 7.1 6.5 - 8.1 g/dL   Albumin 3.9 3.5 - 5.0 g/dL   AST 17 15 - 41 U/L   ALT 18 14 - 54 U/L   Alkaline Phosphatase 123 38 - 126 U/L   Total Bilirubin 0.4 0.3 - 1.2 mg/dL   GFR calc non Af Amer >60 >60 mL/min   GFR calc Af Amer >60 >60 mL/min    Comment: (NOTE) The eGFR has been calculated using the CKD EPI equation. This calculation has not been validated in  all clinical situations. eGFR's persistently <60 mL/min signify possible Chronic Kidney Disease.    Anion gap 8 5 - 15  Protime-INR     Status: None   Collection Time: 05/06/16  4:42 PM  Result Value Ref Range   Prothrombin Time 14.5 11.6 - 15.2 seconds   INR 1.11 0.00 - 1.49  Urinalysis, Routine w reflex microscopic (not at [facility])     Status: None   Collection Time: 05/06/16  4:42 PM  Result Value Ref Range   Color, Urine YELLOW YELLOW   APPearance CLEAR CLEAR   Specific Gravity, Urine 1.014 1.005 - 1.030   pH 6.5 5.0 - 8.0   Glucose, UA NEGATIVE NEGATIVE mg/dL   Hgb urine dipstick NEGATIVE NEGATIVE   Bilirubin Urine NEGATIVE NEGATIVE   Ketones, ur NEGATIVE NEGATIVE mg/dL   Protein, ur NEGATIVE NEGATIVE mg/dL   Nitrite NEGATIVE NEGATIVE   Leukocytes, UA NEGATIVE NEGATIVE    Comment: MICROSCOPIC NOT DONE ON URINES WITH NEGATIVE PROTEIN, BLOOD, LEUKOCYTES, NITRITE, OR GLUCOSE <1000 mg/dL.  Surgical pcr screen     Status: Abnormal   Collection Time: 05/06/16  4:43 PM  Result Value Ref Range   MRSA, PCR NEGATIVE NEGATIVE   Staphylococcus aureus POSITIVE (A) NEGATIVE    Comment:        The Xpert SA Assay (FDA approved for NASAL specimens in patients over 43 years of age), is one component of a comprehensive surveillance program.  Test performance has been validated by [facility] for patients greater than or equal to 51 year old. It is not intended to diagnose infection nor to guide or monitor treatment.   CBC     Status: Abnormal   Collection Time: 05/18/16  4:11 AM  Result Value Ref Range   WBC 13.1 (H) 4.0 - 10.5 K/uL   RBC 4.23 3.87 - 5.11 MIL/uL   Hemoglobin 12.8 12.0 - 15.0 g/dL   HCT 39.9 36.0 - 46.0 %   MCV 94.3 78.0 - 100.0 fL   MCH 30.3 26.0 - 34.0 pg   MCHC 32.1 30.0 - 36.0 g/dL   RDW 12.9 11.5 - 15.5 %   Platelets 349 150 - 400 K/uL  Basic metabolic panel     Status: Abnormal   Collection Time: 05/18/16  4:11 AM  Result Value Ref Range   Sodium 135 135  - 145 mmol/L   Potassium 4.4 3.5 - 5.1 mmol/L   Chloride 99 (L) 101 - 111 mmol/L  CO2 28 22 - 32 mmol/L   Glucose, Bld 150 (H) 65 - 99 mg/dL   BUN 9 6 - 20 mg/dL   Creatinine, Ser 0.47 0.44 - 1.00 mg/dL   Calcium 9.1 8.9 - 10.3 mg/dL   GFR calc non Af Amer >60 >60 mL/min   GFR calc Af Amer >60 >60 mL/min    Comment: (NOTE) The eGFR has been calculated using the CKD EPI equation. This calculation has not been validated in all clinical situations. eGFR's persistently <60 mL/min signify possible Chronic Kidney Disease.    Anion gap 8 5 - 15  CBC     Status: Abnormal   Collection Time: 05/19/16  4:48 AM  Result Value Ref Range   WBC 22.1 (H) 4.0 - 10.5 K/uL   RBC 3.84 (L) 3.87 - 5.11 MIL/uL   Hemoglobin 11.8 (L) 12.0 - 15.0 g/dL   HCT 36.1 36.0 - 46.0 %   MCV 94.0 78.0 - 100.0 fL   MCH 30.7 26.0 - 34.0 pg   MCHC 32.7 30.0 - 36.0 g/dL   RDW 12.8 11.5 - 15.5 %   Platelets 329 150 - 400 K/uL    Discharge Medications:     Medication List    STOP taking these medications        ibuprofen 800 MG tablet  Commonly known as:  ADVIL,MOTRIN      TAKE these medications        aspirin EC 325 MG tablet  Take 1 tablet (325 mg total) by mouth 2 (two) times daily after a meal. Take x 1 month post op to decrease risk of blood clots.     b complex vitamins tablet  Take 1 tablet by mouth daily.     Butalbital-APAP-Caffeine 50-300-40 MG Caps  Take 1 tablet by mouth daily as needed.     cholecalciferol 1000 units tablet  Commonly known as:  VITAMIN D  Take 1,000 Units by mouth daily.     cyclobenzaprine 10 MG tablet  Commonly known as:  FLEXERIL  Take 1 tablet (10 mg total) by mouth 3 (three) times daily as needed for muscle spasms.     DULoxetine 60 MG capsule  Commonly known as:  CYMBALTA  Take 60 mg by mouth 2 (two) times daily.     fluticasone 50 MCG/ACT nasal spray  Commonly known as:  FLONASE  Place 2 sprays into both nostrils daily.     gabapentin 300 MG capsule   Commonly known as:  NEURONTIN  Take 300 mg by mouth at bedtime.     gabapentin 600 MG tablet  Commonly known as:  NEURONTIN  Take 600 mg by mouth 2 (two) times daily.     HYDROmorphone 2 MG tablet  Commonly known as:  DILAUDID  Take 1-2 tablets (2-4 mg total) by mouth every 6 (six) hours as needed for severe pain (For breakthrough pain only.).     multivitamin with minerals Tabs tablet  Take 1 tablet by mouth daily.     oxyCODONE-acetaminophen 5-325 MG tablet  Commonly known as:  PERCOCET/ROXICET  Take 1-2 tablets by mouth every 4 (four) hours as needed for severe pain.        Diagnostic Studies: Dg Chest 2 View  05/07/2016  CLINICAL DATA:  Left knee replacement. EXAM: CHEST  2 VIEW COMPARISON:  04/29/2015. FINDINGS: Mediastinum and hilar structures are normal. Heart size normal. Nodular opacities are noted projected over the right chest at the level of the right anterior fourth  and sixth ribs. This may be related to callus formation from rib fractures. To exclude pulmonary nodule repeat PA and lateral chest x-ray and right rib series suggested for further evaluation. Lungs are otherwise clear. Mild cardiomegaly. No pleural effusion or pneumothorax. Left shoulder replacement. IMPRESSION: 1. Nodular opacities noted projected over the right chest at the level of the right anterior fourth and sixth ribs. This may be related callus formation from prior rib fractures. To exclude pulmonary nodular lesions repeat PA and lateral chest x-ray and right rib series suggested for further evaluation. 2. Mild cardiomegaly. Electronically Signed   By: Marcello Moores  Register   On: 05/07/2016 08:23   Dg Ribs Unilateral W/chest Right  05/17/2016  CLINICAL DATA:  50 year old female who fell 6 months ago with right lung nodularity in June possibly related to prior rib fractures. Smoker. Subsequent encounter. EXAM: RIGHT RIBS AND CHEST - 3+ VIEW COMPARISON:  Radiographs 05/06/2016, 04/29/2015, and earlier. FINDINGS:  On the oblique rib images today there is evidence of a minimally displaced right sixth rib fracture. No fourth rib fracture is identified. However, the vague nodular opacity at the anterior right fourth rib does not project elsewhere in the lung on the oblique rib views. Overall the pulmonary markings are unchanged since June. No superimposed pneumothorax, pulmonary edema or pleural effusion. Mediastinal contours remain normal. Stable visualized osseous structures. IMPRESSION: 1. These radiographs do suggest that the recently identified vague right lung nodules are associated with the right fourth through sixth ribs. However, I feel that given the patient's history of smoking a noncontrast Chest CT should be done to fully confirm the absence of right lung nodules. 2. No new cardiopulmonary abnormality. These results will be called to the ordering clinician or representative by the Radiologist Assistant, and communication documented in the PACS or zVision Dashboard. Electronically Signed   By: Genevie Ann M.D.   On: 05/17/2016 13:56    Disposition: 01-Home or Self Care        Follow-up Information    Follow up with GRAVES,JOHN L, MD. Schedule an appointment as soon as possible for a visit in 2 weeks.   Specialty:  Orthopedic Surgery   Contact information:   [place] [address] [phone]       Follow up with GRAVES,JOHN L, MD In 2 weeks.   Specialty:  Orthopedic Surgery   Contact information:   [employer] [place] [address] [phone]       Follow up with [facility].   Why:  Someone from [facility] will contact you concerning start date and time for therapy.   Contact information:   [address] [place] [address] [phone]        Signed: Erlene Senters 05/28/2016, 9:34 AM

## 2016-08-17 ENCOUNTER — Telehealth: Payer: Self-pay | Admitting: Gastroenterology

## 2016-08-17 NOTE — Telephone Encounter (Signed)
colonoscopy

## 2016-08-27 ENCOUNTER — Other Ambulatory Visit: Payer: Self-pay

## 2016-08-27 NOTE — Telephone Encounter (Signed)
Screening Colonoscopy Z12.11 09/03/2016 [facility] Dr. Alvina Filbert Pre cert is not required

## 2016-08-27 NOTE — Telephone Encounter (Signed)
Gastroenterology Pre-Procedure Review  Request Date: 09/03/2016 Requesting Physician: Dr. Brunetta Genera  PATIENT REVIEW QUESTIONS: The patient responded to the following health history questions as indicated:    1. Are you having any GI issues? no 2. Do you have a personal history of Polyps? no 3. Do you have a family history of Colon Cancer or Polyps? no 4. Diabetes Mellitus? no 5. Joint replacements in the past 12 months?yes (May 17, 2016) 6. Major health problems in the past 3 months?no 7. Any artificial heart valves, MVP, or defibrillator?no    MEDICATIONS & ALLERGIES:    Patient reports the following regarding taking any anticoagulation/antiplatelet therapy:   Plavix, Coumadin, Eliquis, Xarelto, Lovenox, Pradaxa, Brilinta, or Effient? no Aspirin? no  Patient confirms/reports the following medications:  Current Outpatient Prescriptions  Medication Sig Dispense Refill  . amLODipine (NORVASC) 10 MG tablet Take 10 mg by mouth daily.    Marland Kitchen b complex vitamins tablet Take 1 tablet by mouth daily.    . Butalbital-APAP-Caffeine 50-300-40 MG CAPS Take 1 tablet by mouth daily as needed.    . cholecalciferol (VITAMIN D) 1000 units tablet Take by mouth daily.     . cyclobenzaprine (FLEXERIL) 10 MG tablet Take 1 tablet (10 mg total) by mouth 3 (three) times daily as needed for muscle spasms. 30 tablet 0  . DULoxetine (CYMBALTA) 60 MG capsule Take 60 mg by mouth 2 (two) times daily.    . fluticasone (FLONASE) 50 MCG/ACT nasal spray Place 2 sprays into both nostrils daily.    Marland Kitchen gabapentin (NEURONTIN) 300 MG capsule Take 300 mg by mouth at bedtime.     . gabapentin (NEURONTIN) 600 MG tablet Take 600 mg by mouth 2 (two) times daily.    Marland Kitchen ibuprofen (ADVIL,MOTRIN) 800 MG tablet Take 800 mg by mouth every 8 (eight) hours as needed.    Marland Kitchen losartan-hydrochlorothiazide (HYZAAR) 50-12.5 MG tablet Take 1 tablet by mouth daily.    . Multiple Vitamin (MULTIVITAMIN WITH MINERALS) TABS tablet Take 1 tablet by  mouth daily.    Marland Kitchen oxyCODONE-acetaminophen (PERCOCET/ROXICET) 5-325 MG tablet Take 1-2 tablets by mouth every 4 (four) hours as needed for severe pain. 60 tablet 0   No current facility-administered medications for this visit.     Patient confirms/reports the following allergies:  Allergies  Allergen Reactions  . Augmentin [Amoxicillin-Pot Clavulanate] Anaphylaxis  . Bee Venom Hives and Shortness Of Breath    Treats with benadryl  . Diclofenac Sodium Itching and Swelling    Swelling of eyes and face PENNSAID- topical gel  . Other Hives and Shortness Of Breath    Reaction to spider bites (treats with benadryl)  . Sulfa Antibiotics Anaphylaxis and Other (See Comments)    Childhood allergic reaction Sulfonamides    No orders of the defined types were placed in this encounter.   AUTHORIZATION INFORMATION Primary Insurance: 1D#: Group #:  Secondary Insurance: 1D#: Group #:  SCHEDULE INFORMATION: Date: 09/03/2016 Time: Location: [facility]

## 2016-09-03 ENCOUNTER — Encounter: Payer: Self-pay | Admitting: *Deleted

## 2016-09-03 ENCOUNTER — Ambulatory Visit: Payer: BLUE CROSS/BLUE SHIELD | Admitting: Anesthesiology

## 2016-09-03 ENCOUNTER — Ambulatory Visit
Admission: RE | Admit: 2016-09-03 | Discharge: 2016-09-03 | Disposition: A | Discharge status: Home / Self Care | Payer: BLUE CROSS/BLUE SHIELD | Source: Ambulatory Visit | Attending: Gastroenterology | Admitting: Gastroenterology

## 2016-09-03 ENCOUNTER — Encounter
Admission: RE | Disposition: A | Discharge status: Home / Self Care | Payer: Self-pay | Source: Ambulatory Visit | Attending: Gastroenterology

## 2016-09-03 DIAGNOSIS — Z1211 Encounter for screening for malignant neoplasm of colon: Secondary | ICD-10-CM

## 2016-09-03 DIAGNOSIS — Z96612 Presence of left artificial shoulder joint: Secondary | ICD-10-CM | POA: Diagnosis not present

## 2016-09-03 DIAGNOSIS — Z888 Allergy status to other drugs, medicaments and biological substances status: Secondary | ICD-10-CM | POA: Insufficient documentation

## 2016-09-03 DIAGNOSIS — D120 Benign neoplasm of cecum: Secondary | ICD-10-CM | POA: Diagnosis not present

## 2016-09-03 DIAGNOSIS — Z96652 Presence of left artificial knee joint: Secondary | ICD-10-CM | POA: Insufficient documentation

## 2016-09-03 DIAGNOSIS — Z91030 Bee allergy status: Secondary | ICD-10-CM | POA: Diagnosis not present

## 2016-09-03 DIAGNOSIS — M797 Fibromyalgia: Secondary | ICD-10-CM | POA: Insufficient documentation

## 2016-09-03 DIAGNOSIS — K640 First degree hemorrhoids: Secondary | ICD-10-CM | POA: Insufficient documentation

## 2016-09-03 DIAGNOSIS — Z882 Allergy status to sulfonamides status: Secondary | ICD-10-CM | POA: Diagnosis not present

## 2016-09-03 DIAGNOSIS — D125 Benign neoplasm of sigmoid colon: Secondary | ICD-10-CM | POA: Diagnosis not present

## 2016-09-03 DIAGNOSIS — Z881 Allergy status to other antibiotic agents status: Secondary | ICD-10-CM | POA: Insufficient documentation

## 2016-09-03 DIAGNOSIS — Z79899 Other long term (current) drug therapy: Secondary | ICD-10-CM | POA: Diagnosis not present

## 2016-09-03 DIAGNOSIS — J449 Chronic obstructive pulmonary disease, unspecified: Secondary | ICD-10-CM | POA: Diagnosis not present

## 2016-09-03 DIAGNOSIS — M1990 Unspecified osteoarthritis, unspecified site: Secondary | ICD-10-CM | POA: Diagnosis not present

## 2016-09-03 DIAGNOSIS — F17210 Nicotine dependence, cigarettes, uncomplicated: Secondary | ICD-10-CM | POA: Insufficient documentation

## 2016-09-03 DIAGNOSIS — I10 Essential (primary) hypertension: Secondary | ICD-10-CM | POA: Diagnosis not present

## 2016-09-03 HISTORY — PX: COLONOSCOPY WITH PROPOFOL: SHX5780

## 2016-09-03 SURGERY — COLONOSCOPY WITH PROPOFOL
Anesthesia: General

## 2016-09-03 MED ORDER — PROPOFOL 10 MG/ML IV BOLUS
INTRAVENOUS | Status: DC | PRN
  Administered 2016-09-03: 50 mg via INTRAVENOUS

## 2016-09-03 MED ORDER — PROPOFOL 500 MG/50ML IV EMUL
INTRAVENOUS | Status: DC | PRN
  Administered 2016-09-03: 150 ug/kg/min via INTRAVENOUS

## 2016-09-03 MED ORDER — METHYLENE BLUE 0.5 % INJ SOLN
INTRAVENOUS | Status: DC | PRN
  Administered 2016-09-03: 6 mL via SUBMUCOSAL

## 2016-09-03 MED ORDER — SODIUM CHLORIDE 0.9 % IV SOLN
INTRAVENOUS | Status: DC
  Administered 2016-09-03: 09:00:00 via INTRAVENOUS
  Administered 2016-09-03: 1000 mL via INTRAVENOUS

## 2016-09-03 MED ORDER — MIDAZOLAM HCL 2 MG/2ML IJ SOLN
INTRAMUSCULAR | Status: DC | PRN
  Administered 2016-09-03: 1 mg via INTRAVENOUS

## 2016-09-03 MED ORDER — METHYLENE BLUE 0.5 % INJ SOLN
INTRAVENOUS | Status: AC
  Filled 2016-09-03: qty 10

## 2016-09-03 MED ORDER — LIDOCAINE HCL (CARDIAC) 20 MG/ML IV SOLN
INTRAVENOUS | Status: DC | PRN
  Administered 2016-09-03: 60 mg via INTRAVENOUS

## 2016-09-03 NOTE — Transfer of Care (Signed)
Immediate Anesthesia Transfer of Care Note  Patient: [name]  Procedure(s) Performed: Procedure(s): COLONOSCOPY WITH PROPOFOL (N/A)  Patient Location: Endoscopy Unit  Anesthesia Type:General  Level of Consciousness: awake, alert , oriented and patient cooperative  Airway & Oxygen Therapy: Patient Spontanous Breathing and Patient connected to nasal cannula oxygen  Post-op Assessment: Report given to RN, Post -op Vital signs reviewed and stable and Patient moving all extremities X 4  Post vital signs: Reviewed and stable  Last Vitals:  Vitals:   09/03/16 0813  BP: (!) 133/95  Pulse: (!) 111  Resp: 20  Temp: 36.6 C    Last Pain: There were no vitals filed for this visit.       Complications: No apparent anesthesia complications

## 2016-09-03 NOTE — Op Note (Addendum)
[facility] Gastroenterology Patient Name: [name] Procedure Date: 09/03/2016 9:04 AM MRN: [id number] Account #: [id number] Date of Birth: 1966/06/12 Admit Type: Ambulatory Age: 50 Room: [facility] ENDO ROOM 3 Gender: Female Note Status: Finalized Procedure:            Colonoscopy Indications:          Screening for colorectal malignant neoplasm Patient Profile:      Refer to note in patient chart for documentation of                        history and physical. Providers:            [name] MD, MD, Carmelina Dane. Sheppard Coil MD, MD Referring MD:         Ruffin Frederick. Brunetta Genera, MD (Referring MD), Flavia Shipper, MD (Referring MD) Medicines:            Monitored Anesthesia Care Complications:        No immediate complications. Procedure:            Pre-Anesthesia Assessment:                       - Prior to the procedure, a History and Physical was                        performed, and patient medications, allergies and                        sensitivities were reviewed. The patient's tolerance of                        previous anesthesia was reviewed.                       - ASA Grade Assessment: III - A patient with severe                        systemic disease.                       After obtaining informed consent, the colonoscope was                        passed under direct vision. Throughout the procedure,                        the patient's blood pressure, pulse, and oxygen                        saturations were monitored continuously. The                        Colonoscope was introduced through the anus and                        advanced to the the cecum, identified by the                        appendiceal orifice, IC valve and transillumination.  The terminal ileum, ileocecal valve, appendiceal                        orifice, and rectum were photographed. The colonoscopy                        was  performed with ease. The patient tolerated the                        procedure well. The quality of the bowel preparation                        was excellent. Findings:      The digital rectal exam was normal. [pertinent negatives].      An 8 mm, non-bleeding polyp was found in the cecum. The polyp was flat.       This was biopsied with a cold forceps for histology. Area was       successfully injected with 6 mL saline with methylene blue for a lift       polypectomy.      Two sessile, non-bleeding polyps were found in the cecum. The polyps       were less than 5 mm in size. These polyps were removed with a cold       biopsy forceps. Resection and retrieval were complete.      A 10 mm polyp was found in the sigmoid colon. The polyp was       pedunculated. The polyp was removed with a hot snare. Resection and       retrieval were complete. To prevent bleeding after the polypectomy, a       hemostatic clip was successfully placed. There was no bleeding at the       end of the procedure.      Two sessile, non-bleeding polyps were found in the distal sigmoid colon.       The polyps were 3 mm in size. These polyps were removed with a cold       snare. Resection and retrieval were complete.      Non-bleeding internal hemorrhoids were found during retroflexion. The       hemorrhoids were medium-sized and Grade I (internal hemorrhoids that do       not prolapse). Impression:           - One 8 mm, non-bleeding polyp in the cecum. Biopsied.                        Injected.                       - Two less than 5 mm, non-bleeding polyps in the cecum,                        removed with a cold biopsy forceps. Resected and                        retrieved.                       - One 10 mm polyp in the sigmoid colon, removed with a  hot snare. Resected and retrieved.                       - Two 3 mm, non-bleeding polyps in the distal sigmoid                        colon,  removed with a cold snare. Resected and                        retrieved.                       - Non-bleeding internal hemorrhoids. Recommendation:       - Discharge patient to home (with escort).                       - Resume previous diet today.                       - Continue present medications. Procedure Code(s):    --- Professional ---                       [phone], Colonoscopy, flexible; with removal of tumor(s),                        polyp(s), or other lesion(s) by snare technique                       [id number], Colonoscopy, flexible; with directed submucosal                        injection(s), any substance                       [id number], 59, Colonoscopy, flexible; with biopsy, single                        or multiple Diagnosis Code(s):    --- Professional ---                       Z12.11, Encounter for screening for malignant neoplasm                        of colon                       D12.5, Benign neoplasm of sigmoid colon                       D12.0, Benign neoplasm of cecum CPT copyright 2016 American Medical Association. All rights reserved. The codes documented in this report are preliminary and upon coder review may  be revised to meet current compliance requirements. Attending Participation:      I personally performed the entire procedure. [name], MD [name] MD, MD 09/03/2016 9:55:58 AM This report has been signed electronically. Gayland Curry MD, MD Number of Addenda: 0 Note Initiated On: 09/03/2016 9:04 AM Scope Withdrawal Time: 0 hours 28 minutes 0 seconds  Total Procedure Duration: 0 hours 31 minutes 1 second       [facility]

## 2016-09-03 NOTE — Anesthesia Postprocedure Evaluation (Signed)
Anesthesia Post Note  Patient: [name]  Procedure(s) Performed: Procedure(s) (LRB): COLONOSCOPY WITH PROPOFOL (N/A)  Patient location during evaluation: PACU Anesthesia Type: General Level of consciousness: awake Pain management: pain level controlled Vital Signs Assessment: post-procedure vital signs reviewed and stable Respiratory status: spontaneous breathing Cardiovascular status: stable Anesthetic complications: no    Last Vitals:  Vitals:   09/03/16 1006 09/03/16 1016  BP: (!) 141/99 (!) 142/81  Pulse: 97 99  Resp: 19 (!) 27  Temp:      Last Pain:  Vitals:   09/03/16 0946  TempSrc: Tympanic                 VAN STAVEREN,Shamica Moree

## 2016-09-03 NOTE — Anesthesia Preprocedure Evaluation (Signed)
Anesthesia Evaluation  Patient identified by MRN, date of birth, ID band Patient awake    Reviewed: Allergy & Precautions, NPO status , Patient's Chart, lab work & pertinent test results  Airway Mallampati: II       Dental  (+) Teeth Intact   Pulmonary COPD, Current Smoker,     + decreased breath sounds      Cardiovascular Exercise Tolerance: Good hypertension, Pt. on medications  Rhythm:Regular Rate:Normal     Neuro/Psych Anxiety negative neurological ROS     GI/Hepatic negative GI ROS, Neg liver ROS,   Endo/Other  Morbid obesity  Renal/GU negative Renal ROS     Musculoskeletal   Abdominal (+) + obese,   Peds  Hematology   Anesthesia Other Findings   Reproductive/Obstetrics                             Anesthesia Physical Anesthesia Plan  ASA: III  Anesthesia Plan: General   Post-op Pain Management:    Induction: Intravenous  Airway Management Planned: Natural Airway and Nasal Cannula  Additional Equipment:   Intra-op Plan:   Post-operative Plan:   Informed Consent: I have reviewed the patients History and Physical, chart, labs and discussed the procedure including the risks, benefits and alternatives for the proposed anesthesia with the patient or authorized representative who has indicated his/her understanding and acceptance.     Plan Discussed with: CRNA  Anesthesia Plan Comments:         Anesthesia Quick Evaluation

## 2016-09-03 NOTE — Discharge Instructions (Signed)
[name] MD [address] [place], [place] [address] Phone: [phone] Fax : [phone]   YOU HAD AN ENDOSCOPIC PROCEDURE TODAY: Refer to the procedure report that was given to you for any specific questions about what was found during the examination.  If the procedure report does not answer your questions, please call your gastroenterologist to clarify.  YOU SHOULD EXPECT: Some feelings of bloating in the abdomen. Passage of more gas than usual.  Walking can help get rid of the air that was put into your GI tract during the procedure and reduce the bloating. If you had a lower endoscopy (such as a colonoscopy or flexible sigmoidoscopy) you may notice spotting of blood in your stool or on the toilet paper.   DIET: Your first meal following the procedure should be a light meal and then it is ok to progress to your normal diet.  A half-sandwich or bowl of soup is an example of a good first meal.  Heavy or fried foods are harder to digest and may make you feel nasueas or bloated.  Drink plenty of fluids but you should avoid alcoholic beverages for 24 hours.  ACTIVITY: Your care partner should take you home directly after the procedure.  You should plan to take it easy, moving slowly for the rest of the day.  You can resume normal activity the day after the procedure however you should NOT DRIVE, make legal decisions or use heavy machinery for 24 hours (because of the sedation medicines used during the test).    SYMPTOMS TO REPORT IMMEDIATELY  A gastroenterologist can be reached at any hour.  Please call your doctor's office for any of the following symptoms:   Following lower endoscopy (colonoscopy, flexible sigmoidoscopy)  Excessive amounts of blood in the stool  Significant tenderness, worsening of abdominal pains  Swelling of the abdomen that is new, acute  Fever of 100 or higher  Following upper endoscopy (EGD, EUS, ERCP)  Vomiting of blood or coffee ground material  New,  significant abdominal pain  New, significant chest pain or pain under the shoulder blades  Painful or persistently difficult swallowing  New shortness of breath  Black, tarry-looking stools  FOLLOW UP: If any biopsies were taken you will be contacted by phone or by letter within the next 1-3 weeks.  Call your gastroenterologist if you have not heard about the biopsies in 3 weeks.   Please also call your gastroenterologist's office with any specific questions about appointments or follow up tests.

## 2016-09-03 NOTE — H&P (Signed)
[name] MD [address] [place], [place] [address] Phone: [phone] Fax : [phone]  Primary Care Physician:  Lorelee Market, MD Primary Gastroenterologist:  Dr. [name]   Pre-Procedure History & Physical: HPI:  [name] is a 50 y.o. female is here for an colonoscopy.   Past Medical History:  Diagnosis Date  . Arthritis   . COPD (chronic obstructive pulmonary disease) ([facility])   . Fibromyalgia   . Hypertension     Past Surgical History:  Procedure Laterality Date  . FRACTURE SURGERY     left foot   . KNEE CARTILAGE SURGERY  2015   left  . LAPAROSCOPIC SALPINGOOPHERECTOMY    . left hand     1 for ganglion cyst and next yr replace "joints" in left hand  . PLANTAR FASCIA SURGERY     left foot  . SHOULDER INJECTION Right 05/17/2016   Procedure: SHOULDER INJECTION;  Surgeon: Dorna Leitz, MD;  Location: [facility];  Service: Orthopedics;  Laterality: Right;  . SHOULDER SURGERY     x 2 on right shoulder  . TONSILLECTOMY    . TOTAL KNEE ARTHROPLASTY Left 05/17/2016   Procedure: TOTAL KNEE ARTHROPLASTY;  Surgeon: Dorna Leitz, MD;  Location: [facility];  Service: Orthopedics;  Laterality: Left;  . TOTAL SHOULDER ARTHROPLASTY Left 05/09/2015   Procedure: TOTAL SHOULDER ARTHROPLASTY;  Surgeon: Dorna Leitz, MD;  Location: [facility];  Service: Orthopedics;  Laterality: Left;  . TUBAL LIGATION      Prior to Admission medications   Medication Sig Start Date End Date Taking? Authorizing Provider  amLODipine (NORVASC) 10 MG tablet Take 10 mg by mouth daily.    Historical Provider, MD  b complex vitamins tablet Take 1 tablet by mouth daily.    Historical Provider, MD  Butalbital-APAP-Caffeine 50-300-40 MG CAPS Take 1 tablet by mouth daily as needed. 03/25/16   Historical Provider, MD  cholecalciferol (VITAMIN D) 1000 units tablet Take by mouth daily.     Historical Provider, MD  cyclobenzaprine (FLEXERIL) 10 MG tablet Take 1 tablet (10 mg total) by mouth 3 (three) times  daily as needed for muscle spasms. 05/17/16   Gary Fleet, PA-C  DULoxetine (CYMBALTA) 60 MG capsule Take 60 mg by mouth 2 (two) times daily.    Historical Provider, MD  fluticasone (FLONASE) 50 MCG/ACT nasal spray Place 2 sprays into both nostrils daily.    Historical Provider, MD  gabapentin (NEURONTIN) 300 MG capsule Take 300 mg by mouth at bedtime.     Historical Provider, MD  gabapentin (NEURONTIN) 600 MG tablet Take 600 mg by mouth 2 (two) times daily. 03/25/16   Historical Provider, MD  ibuprofen (ADVIL,MOTRIN) 800 MG tablet Take 800 mg by mouth every 8 (eight) hours as needed.    Historical Provider, MD  losartan-hydrochlorothiazide (HYZAAR) 50-12.5 MG tablet Take 1 tablet by mouth daily.    Historical Provider, MD  Multiple Vitamin (MULTIVITAMIN WITH MINERALS) TABS tablet Take 1 tablet by mouth daily.    Historical Provider, MD  oxyCODONE-acetaminophen (PERCOCET/ROXICET) 5-325 MG tablet Take 1-2 tablets by mouth every 4 (four) hours as needed for severe pain. 05/17/16   Gary Fleet, PA-C    Allergies as of 08/27/2016 - Review Complete 08/27/2016  Allergen Reaction Noted  . Augmentin [amoxicillin-pot clavulanate] Anaphylaxis 01/24/2015  . Bee venom Hives and Shortness Of Breath 04/28/2015  . Diclofenac sodium Itching and Swelling 05/05/2016  . Other Hives and Shortness Of Breath 04/28/2015  . Sulfa antibiotics Anaphylaxis and Other (See  Comments) 01/24/2015    History reviewed. No pertinent family history.  Social History   Social History  . Marital status: Single    Spouse name: N/A  . Number of children: N/A  . Years of education: N/A   Occupational History  . Not on file.   Social History Main Topics  . Smoking status: Current Some Day Smoker    Packs/day: 0.50    Years: 30.00    Types: Cigarettes  . Smokeless tobacco: Never Used  . Alcohol use Yes     Comment: monthly  . Drug use: No  . Sexual activity: Not on file   Other Topics Concern  . Not on file    Social History Narrative  . No narrative on file    Review of Systems: See HPI, otherwise negative ROS  Physical Exam: BP 132/84   Pulse 99   Temp (!) 96.2 F (35.7 C) (Tympanic)   Resp (!) 21   Ht 5\' 1"  (1.549 m)   Wt 235 lb (106.6 kg)   SpO2 100%   BMI 44.40 kg/m  General:   Alert,  pleasant and cooperative in NAD Head:  Normocephalic and atraumatic. Neck:  Supple; no masses or thyromegaly. Lungs:  Clear throughout to auscultation.    Heart:  Regular rate and rhythm. Abdomen:  Soft, nontender and nondistended. Normal bowel sounds, without guarding, and without rebound.   Neurologic:  Alert and  oriented x4;  grossly normal neurologically.  Impression/Plan: [name] is here for an colonoscopy to be performed for CRCS average risk   Risks, benefits, limitations, and alternatives regarding  colonoscopy have been reviewed with the patient.  Questions have been answered.  All parties agreeable.   [name], MD  09/03/2016, 9.00 am

## 2016-09-06 ENCOUNTER — Encounter: Payer: Self-pay | Admitting: Gastroenterology

## 2016-09-06 LAB — SURGICAL PATHOLOGY

## 2016-09-10 NOTE — Telephone Encounter (Signed)
Pt notified of colonoscopy results. Pt added to recall list. 

## 2016-09-10 NOTE — Telephone Encounter (Signed)
-----   Message from [name], MD sent at 09/06/2016 10:36 AM EDT ----- Tubular adenomas x3 - repeat colonoscopy in 3 years

## 2016-09-23 ENCOUNTER — Telehealth: Payer: Self-pay

## 2016-09-23 NOTE — Telephone Encounter (Signed)
Pt notified of procedure results. Pt added to recall list.  

## 2016-09-23 NOTE — Telephone Encounter (Signed)
-----   Message from [name], MD sent at 09/22/2016  9:09 AM EST ----- Tubular adenomas - one was 10 mm - repeat colonoscopy in 3 years

## 2017-03-30 ENCOUNTER — Other Ambulatory Visit: Payer: Self-pay | Admitting: Pain Medicine

## 2017-03-30 DIAGNOSIS — M545 Low back pain, unspecified: Secondary | ICD-10-CM

## 2017-03-30 DIAGNOSIS — G8929 Other chronic pain: Secondary | ICD-10-CM

## 2017-04-29 ENCOUNTER — Ambulatory Visit
Admission: RE | Admit: 2017-04-29 | Discharge: 2017-04-29 | Disposition: A | Discharge status: Home / Self Care | Payer: BLUE CROSS/BLUE SHIELD | Source: Ambulatory Visit | Attending: Pain Medicine | Admitting: Pain Medicine

## 2017-04-29 DIAGNOSIS — M545 Low back pain, unspecified: Secondary | ICD-10-CM

## 2017-04-29 DIAGNOSIS — G8929 Other chronic pain: Secondary | ICD-10-CM

## 2017-06-07 ENCOUNTER — Emergency Department (HOSPITAL_COMMUNITY): Payer: BLUE CROSS/BLUE SHIELD

## 2017-06-07 ENCOUNTER — Emergency Department (HOSPITAL_COMMUNITY)
Admission: EM | Admit: 2017-06-07 | Discharge: 2017-06-07 | Disposition: A | Discharge status: Home / Self Care | Payer: BLUE CROSS/BLUE SHIELD | Attending: Emergency Medicine | Admitting: Emergency Medicine

## 2017-06-07 DIAGNOSIS — M545 Low back pain: Secondary | ICD-10-CM | POA: Diagnosis present

## 2017-06-07 DIAGNOSIS — F17210 Nicotine dependence, cigarettes, uncomplicated: Secondary | ICD-10-CM | POA: Insufficient documentation

## 2017-06-07 DIAGNOSIS — Z79899 Other long term (current) drug therapy: Secondary | ICD-10-CM | POA: Insufficient documentation

## 2017-06-07 DIAGNOSIS — Z96612 Presence of left artificial shoulder joint: Secondary | ICD-10-CM | POA: Diagnosis not present

## 2017-06-07 DIAGNOSIS — M546 Pain in thoracic spine: Secondary | ICD-10-CM | POA: Insufficient documentation

## 2017-06-07 DIAGNOSIS — Z96652 Presence of left artificial knee joint: Secondary | ICD-10-CM | POA: Diagnosis not present

## 2017-06-07 DIAGNOSIS — J449 Chronic obstructive pulmonary disease, unspecified: Secondary | ICD-10-CM | POA: Diagnosis not present

## 2017-06-07 DIAGNOSIS — R0781 Pleurodynia: Secondary | ICD-10-CM

## 2017-06-07 DIAGNOSIS — I10 Essential (primary) hypertension: Secondary | ICD-10-CM | POA: Diagnosis not present

## 2017-06-07 MED ORDER — METHOCARBAMOL 500 MG PO TABS: 500.0000 mg | ORAL_TABLET | Freq: Two times a day (BID) | ORAL | 0 refills | Status: DC

## 2017-06-07 MED ORDER — METHOCARBAMOL 500 MG PO TABS: 500.0000 mg | ORAL_TABLET | Freq: Once | ORAL | Status: DC

## 2017-06-07 NOTE — ED Triage Notes (Signed)
Pt states she pulled a muscle in the right side of her back 3 days ago has been trying to rest it but pain is still 9/10.

## 2017-06-07 NOTE — ED Notes (Signed)
PT states understanding of care given, follow up care, and medication prescribed. PT is ambulated from ED to car with a steady gait.  

## 2017-06-07 NOTE — ED Provider Notes (Signed)
[facility] DEPT Provider Note   CSN: [id number] Arrival date & time: 06/07/17  1639  By signing my name below, I, Ephriam Jenkins, attest that this documentation has been prepared under the direction and in the presence of [employer].  Electronically Signed: Ephriam Jenkins, ED Scribe. 06/07/17. 7:55 PM.  History   Chief Complaint Chief Complaint  Patient presents with  . Back Pain  . Flank Pain   HPI HPI Comments:  [name] is a 51 y.o. female who presents to the Emergency Department complaining of gradual onset, persistent right sided lower back pain that started 3 days ago. Pt notes that the pain started while she was getting out of her car. She did not feel a pop or crack during this incident but had gradual onset pain. Pt notes that the pain is exacerbated when raising her right leg and when coughing. She further reports a radiation of pain from her lower back that radiates upwards into her mid back on the same side. Pt takes Vicodin prn for pain management of her left hip and left leg. No loss of bowel or bladder. No fever, chills. No numbness or weakness.  The history is provided by the patient. No language interpreter was used.    Past Medical History:  Diagnosis Date  . Arthritis   . COPD (chronic obstructive pulmonary disease) ([facility])   . Fibromyalgia   . Hypertension     Patient Active Problem List   Diagnosis Date Noted  . Benign neoplasm of cecum   . Special screening for malignant neoplasms, colon   . Primary osteoarthritis of left knee 05/17/2016  . Tendinitis of right rotator cuff 05/17/2016  . Osteoarthritis of left shoulder 05/09/2015  . TOBACCO ABUSE 06/20/2008  . CONSTIPATION 06/18/2008  . HEART MURMUR, BENIGN 06/18/2008  . OTHER RESPIRATORY COMPLICATIONS 53/61/4431    Past Surgical History:  Procedure Laterality Date  . COLONOSCOPY WITH PROPOFOL N/A 09/03/2016   Procedure: COLONOSCOPY WITH PROPOFOL;  Surgeon: [name], MD;  Location:  [facility];  Service: Endoscopy;  Laterality: N/A;  . FRACTURE SURGERY     left foot   . KNEE CARTILAGE SURGERY  2015   left  . LAPAROSCOPIC SALPINGOOPHERECTOMY    . left hand     1 for ganglion cyst and next yr replace "joints" in left hand  . PLANTAR FASCIA SURGERY     left foot  . SHOULDER INJECTION Right 05/17/2016   Procedure: SHOULDER INJECTION;  Surgeon: Dorna Leitz, MD;  Location: [facility];  Service: Orthopedics;  Laterality: Right;  . SHOULDER SURGERY     x 2 on right shoulder  . TONSILLECTOMY    . TOTAL KNEE ARTHROPLASTY Left 05/17/2016   Procedure: TOTAL KNEE ARTHROPLASTY;  Surgeon: Dorna Leitz, MD;  Location: [facility];  Service: Orthopedics;  Laterality: Left;  . TOTAL SHOULDER ARTHROPLASTY Left 05/09/2015   Procedure: TOTAL SHOULDER ARTHROPLASTY;  Surgeon: Dorna Leitz, MD;  Location: [facility];  Service: Orthopedics;  Laterality: Left;  . TUBAL LIGATION      OB History    No data available       Home Medications    Prior to Admission medications   Medication Sig Start Date End Date Taking? Authorizing Provider  amLODipine (NORVASC) 10 MG tablet Take 10 mg by mouth daily.    [provider]  b complex vitamins tablet Take 1 tablet by mouth daily.    [provider]  Butalbital-APAP-Caffeine 50-300-40 MG CAPS Take 1 tablet by  mouth daily as needed. 03/25/16   [provider]  cholecalciferol (VITAMIN D) 1000 units tablet Take by mouth daily.     [provider]  cyclobenzaprine (FLEXERIL) 10 MG tablet Take 1 tablet (10 mg total) by mouth 3 (three) times daily as needed for muscle spasms. 05/17/16   Gary Fleet, PA-C  DULoxetine (CYMBALTA) 60 MG capsule Take 60 mg by mouth 2 (two) times daily.    [provider]  fluticasone (FLONASE) 50 MCG/ACT nasal spray Place 2 sprays into both nostrils daily.    [provider]  gabapentin (NEURONTIN) 300 MG capsule Take 300 mg by mouth at bedtime.     [provider]    gabapentin (NEURONTIN) 600 MG tablet Take 600 mg by mouth 2 (two) times daily. 03/25/16   [provider]  losartan-hydrochlorothiazide (HYZAAR) 50-12.5 MG tablet Take 1 tablet by mouth daily.    [provider]  Multiple Vitamin (MULTIVITAMIN WITH MINERALS) TABS tablet Take 1 tablet by mouth daily.    [provider]  oxyCODONE-acetaminophen (PERCOCET/ROXICET) 5-325 MG tablet Take 1-2 tablets by mouth every 4 (four) hours as needed for severe pain. 05/17/16   Gary Fleet, PA-C    Family History No family history on file.  Social History Social History  Substance Use Topics  . Smoking status: Current Some Day Smoker    Packs/day: 0.50    Years: 30.00    Types: Cigarettes  . Smokeless tobacco: Never Used  . Alcohol use Yes     Comment: monthly     Allergies   Augmentin [amoxicillin-pot clavulanate]; Bee venom; Diclofenac sodium; Other; and Sulfa antibiotics   Review of Systems Review of Systems  Constitutional: Negative for chills and fever.  Genitourinary: Negative for urgency.  Musculoskeletal: Positive for back pain (lower back, mid-back) and myalgias. Negative for arthralgias, gait problem, neck pain and neck stiffness.  Neurological: Negative for weakness and numbness.    Physical Exam Updated Vital Signs BP (!) 156/92 (BP Location: Left Arm)   Pulse 92   Temp 98.2 F (36.8 C) (Oral)   Resp (!) 22   Ht 5\' 1"  (1.549 m)   Wt 220 lb (99.8 kg)   SpO2 93%   BMI 41.57 kg/m   Physical Exam  Constitutional: She is oriented to person, place, and time. She appears well-developed and well-nourished. No distress.  HENT:  Head: Normocephalic and atraumatic.  Neck: Normal range of motion.  Pulmonary/Chest: Effort normal.  Lung sounds clear throughout-normal lung expansion  Musculoskeletal:  No CT or L-spine tenderness.  Patient has tenderness to palpation of the right lower thoracic soft tissue and right lateral ribs  Neurological: She is  alert and oriented to person, place, and time.  Skin: Skin is warm and dry. She is not diaphoretic.  Psychiatric: She has a normal mood and affect. Judgment normal.  Nursing note and vitals reviewed.   ED Treatments / Results  DIAGNOSTIC STUDIES: Oxygen Saturation is 93% on RA, adequate by my interpretation.  COORDINATION OF CARE: 7:35 PM-Discussed treatment plan with pt at bedside and pt agreed to plan.   Labs (all labs ordered are listed, but only abnormal results are displayed) Labs Reviewed - No data to display  EKG  EKG Interpretation None       Radiology No results found.  Procedures Procedures (including critical care time)  Medications Ordered in ED Medications  methocarbamol (ROBAXIN) tablet 500 mg (not administered)     Initial Impression / Assessment and Plan /  ED Course  I have reviewed the triage vital signs and the nursing notes.  Pertinent labs & imaging results that were available during my care of the patient were reviewed by me and considered in my medical decision making (see chart for details).     Final Clinical Impressions(s) / ED Diagnoses   Final diagnoses:  Acute right-sided thoracic back pain  Rib pain    Assessment/Plan: 51 year old female presents today with back pain.  She has tenderness to palpation of the musculature of her back and ribs.  This is likely muscular spasm.  Patient had plain films here to assess for acute fracture.  Patient would like muscle relaxer prior to discharge as she is unable to fill her medications at the pharmacy.  Pt will not be given medications at they could potentially cause drowsiness.  Patient will be given a prescription as there is a 24 Hour Pharmacy Close to this hospital.  She will have her prescriptions filled as an outpatient.  Patient plain films pending at the time of shift change, oncoming provider will evaluate films for acute findings.   New Prescriptions New Prescriptions   No medications  on file   I personally performed the services described in this documentation, which was scribed in my presence. The recorded information has been reviewed and is accurate.   Okey Regal, PA-C 06/07/17 2121    Daleen Bo, MD 06/09/17 [phone]

## 2017-06-07 NOTE — ED Notes (Signed)
PT refused vitals and refused to sign stating she disliked the care and will be coming back to see the doctor because he lied to her. Pt cussing while riding on the wheelchair out of the hospital.

## 2017-06-07 NOTE — Discharge Instructions (Signed)
Please read attached information. If you experience any new or worsening signs or symptoms please return to the emergency room for evaluation. Please follow-up with your primary care provider or specialist as discussed. Please use medication prescribed only as directed and discontinue taking if you have any concerning signs or symptoms.   °

## 2017-06-07 NOTE — ED Notes (Signed)
Patient is A&Ox4.  No signs of distress noted.  Please see providers complete history and physical exam.  

## 2017-06-08 NOTE — ED Provider Notes (Signed)
THIS IS A SHARED VISIT WITH JEFF HEDGES, PAC  [name] is a 51 y.o. female with back pain that occurred while she was getting out of her car.  Assumed care of patient while she was waiting for x-ray results.  BP (!) 156/92 (BP Location: Left Arm)   Pulse 92   Temp 98.2 F (36.8 C) (Oral)   Resp (!) 22   Ht 5\' 1"  (1.549 m)   Wt 99.8 kg (220 lb)   SpO2 93%   BMI 41.57 kg/m    Dg Ribs Unilateral W/chest Right  Result Date: 06/07/2017 CLINICAL DATA:  51 year old female status post blunt trauma 3 days ago with pain radiating to the right side. EXAM: RIGHT RIBS AND CHEST - 3+ VIEW COMPARISON:  Thoracic spine series today reported separately. Chest and right rib radiographs 05/17/2016. FINDINGS: Stable lung volumes. Normal cardiac size and mediastinal contours. No pneumothorax, pulmonary edema, pleural effusion or acute pulmonary opacity. Chronic basilar predominant increased interstitial markings. Visualized tracheal air column is within normal limits. Negative visible bowel gas pattern. Rib marker is placed at the posterior right eleventh rib level. Limited bone detail of the lower right ribs due to large body habitus. No acute displaced right rib fracture identified. There is a chronic appearing right lateral sixth rib fracture. Other visible osseous structures appear stable and intact. Chronic left shoulder arthroplasty IMPRESSION: 1. No acute displaced right rib fracture identified. Chronic appearing right lateral sixth rib fracture. 2.  No acute cardiopulmonary abnormality. Electronically Signed   By: Genevie Ann M.D.   On: 06/07/2017 21:42   Dg Thoracic Spine 2 View  Result Date: 06/07/2017 CLINICAL DATA:  51 year old female status post blunt trauma 3 days ago with severe lower thoracic spine pain radiating to the right side. EXAM: THORACIC SPINE 2 VIEWS COMPARISON:  Chest radiographs 05/06/2016. FINDINGS: Chronic dextroconvex lower thoracic scoliosis. Thoracic segmentation appears normal.  Chronic bulky mid and lower thoracic endplate osteophytosis. Thoracic vertebral height and alignment is stable since 2017. Chronic left shoulder arthroplasty. Stable visualized thoracic visceral contours. IMPRESSION: Chronic thoracic scoliosis and endplate degeneration. No acute osseous abnormality identified. Electronically Signed   By: Genevie Ann M.D.   On: 06/07/2017 21:39    I discussed x-ray results with the patient and plan of care.  Patient is upset that she is not getting medication for pain prior to d/c. Explained to the patient that because she has over a 30 minute drive home it would not be save to give medication that cold affect her ability to drive. Patient has 2 children with her.  D/c instructions and Rx given to the patient.    Debroah Baller [place], [place] 06/08/17 2060    Daleen Bo, MD 06/09/17 [phone]

## 2017-08-03 ENCOUNTER — Other Ambulatory Visit: Payer: Self-pay | Admitting: Orthopedic Surgery

## 2017-08-03 DIAGNOSIS — M533 Sacrococcygeal disorders, not elsewhere classified: Secondary | ICD-10-CM

## 2017-08-08 ENCOUNTER — Ambulatory Visit
Admission: RE | Admit: 2017-08-08 | Discharge: 2017-08-08 | Disposition: A | Discharge status: Home / Self Care | Payer: BLUE CROSS/BLUE SHIELD | Source: Ambulatory Visit | Attending: Orthopedic Surgery | Admitting: Orthopedic Surgery

## 2017-08-08 DIAGNOSIS — M533 Sacrococcygeal disorders, not elsewhere classified: Secondary | ICD-10-CM

## 2017-08-22 ENCOUNTER — Other Ambulatory Visit: Payer: BLUE CROSS/BLUE SHIELD

## 2017-11-18 ENCOUNTER — Ambulatory Visit: Payer: Self-pay | Admitting: Obstetrics and Gynecology

## 2017-11-18 NOTE — Progress Notes (Deleted)
Gynecology Annual Exam  PCP: Lorelee Market, MD  Chief Complaint: No chief complaint on file.   History of Present Illness:Patient is a 52 y.o. No obstetric history on file. presents for annual exam. The patient has no complaints today.   LMP: No LMP recorded. Patient is postmenopausal. Menarche:{numbers 9-32:67124} Average Interval: {Desc; regular/irreg:14544}, {numbers 22-35:14824} days Duration of flow: {numbers; 0-10:33138} days Heavy Menses: {yes/no:63} Clots: {yes/no:63} Intermenstrual Bleeding: {yes/no:63} Postcoital Bleeding: {yes/no:63} Dysmenorrhea: {yes/no:63}   The patient {sys sexually active:13135} sexually active. She {has/denies:315300} dyspareunia.  The patient {DOES_DOES PYK:99833} perform self breast exams.  There {is/is no:19420} notable family history of breast or ovarian cancer in her family.  The patient wears seatbelts: {yes/no:63}.   The patient has regular exercise: {yes/no/not asked:9010}.    The patient {Blank single:19197::"reports","denies"} current symptoms of depression.     Review of Systems: ROS  Past Medical History:  Past Medical History:  Diagnosis Date  . Arthritis   . COPD (chronic obstructive pulmonary disease) ([place])   . Fibromyalgia   . Hypertension     Past Surgical History:  Past Surgical History:  Procedure Laterality Date  . COLONOSCOPY WITH PROPOFOL N/A 09/03/2016   Procedure: COLONOSCOPY WITH PROPOFOL;  Surgeon: [name], MD;  Location: [facility];  Service: Endoscopy;  Laterality: N/A;  . FRACTURE SURGERY     left foot   . KNEE CARTILAGE SURGERY  2015   left  . LAPAROSCOPIC SALPINGOOPHERECTOMY    . left hand     1 for ganglion cyst and next yr replace "joints" in left hand  . PLANTAR FASCIA SURGERY     left foot  . SHOULDER INJECTION Right 05/17/2016   Procedure: SHOULDER INJECTION;  Surgeon: Dorna Leitz, MD;  Location: [place];  Service: Orthopedics;  Laterality: Right;  . SHOULDER SURGERY     x 2 on  right shoulder  . TONSILLECTOMY    . TOTAL KNEE ARTHROPLASTY Left 05/17/2016   Procedure: TOTAL KNEE ARTHROPLASTY;  Surgeon: Dorna Leitz, MD;  Location: [place];  Service: Orthopedics;  Laterality: Left;  . TOTAL SHOULDER ARTHROPLASTY Left 05/09/2015   Procedure: TOTAL SHOULDER ARTHROPLASTY;  Surgeon: Dorna Leitz, MD;  Location: [place];  Service: Orthopedics;  Laterality: Left;  . TUBAL LIGATION      Gynecologic History:  No LMP recorded. Patient is postmenopausal. Last Pap: Results were: 11/17/2016 NIL and HR HPV+  Last mammogram: 11/03/2015 Results were: BI-RAD I Obstetric History: No obstetric history on file.  Family History:  No family history on file.  Social History:  Social History   Socioeconomic History  . Marital status: Single    Spouse name: Not on file  . Number of children: Not on file  . Years of education: Not on file  . Highest education level: Not on file  Social Needs  . Financial resource strain: Not on file  . Food insecurity - worry: Not on file  . Food insecurity - inability: Not on file  . Transportation needs - medical: Not on file  . Transportation needs - non-medical: Not on file  Occupational History  . Not on file  Tobacco Use  . Smoking status: Current Some Day Smoker    Packs/day: 0.50    Years: 30.00    Pack years: 15.00    Types: Cigarettes  . Smokeless tobacco: Never Used  Substance and Sexual Activity  . Alcohol use: Yes    Comment: monthly  . Drug use: No  . Sexual activity: Not  on file  Other Topics Concern  . Not on file  Social History Narrative  . Not on file    Allergies:  Allergies  Allergen Reactions  . Augmentin [Amoxicillin-Pot Clavulanate] Anaphylaxis  . Bee Venom Hives and Shortness Of Breath    Treats with benadryl  . Diclofenac Sodium Itching and Swelling    Swelling of eyes and face PENNSAID- topical gel  . Other Hives and Shortness Of Breath    Reaction to spider bites (treats with benadryl)  . Sulfa  Antibiotics Anaphylaxis and Other (See Comments)    Childhood allergic reaction Sulfonamides    Medications: Prior to Admission medications   Medication Sig Start Date End Date Taking? Authorizing Provider  amLODipine (NORVASC) 10 MG tablet Take 10 mg by mouth daily.    [provider]  b complex vitamins tablet Take 1 tablet by mouth daily.    [provider]  Butalbital-APAP-Caffeine 50-300-40 MG CAPS Take 1 tablet by mouth daily as needed. 03/25/16   [provider]  cholecalciferol (VITAMIN D) 1000 units tablet Take by mouth daily.     [provider]  cyclobenzaprine (FLEXERIL) 10 MG tablet Take 1 tablet (10 mg total) by mouth 3 (three) times daily as needed for muscle spasms. 05/17/16   Gary Fleet, PA-C  DULoxetine (CYMBALTA) 60 MG capsule Take 60 mg by mouth 2 (two) times daily.    [provider]  fluticasone (FLONASE) 50 MCG/ACT nasal spray Place 2 sprays into both nostrils daily.    [provider]  gabapentin (NEURONTIN) 300 MG capsule Take 300 mg by mouth at bedtime.     [provider]  gabapentin (NEURONTIN) 600 MG tablet Take 600 mg by mouth 2 (two) times daily. 03/25/16   [provider]  losartan-hydrochlorothiazide (HYZAAR) 50-12.5 MG tablet Take 1 tablet by mouth daily.    [provider]  methocarbamol (ROBAXIN) 500 MG tablet Take 1 tablet (500 mg total) by mouth 2 (two) times daily. 06/07/17   Hedges, Dellis Filbert, PA-C  Multiple Vitamin (MULTIVITAMIN WITH MINERALS) TABS tablet Take 1 tablet by mouth daily.    [provider]  oxyCODONE-acetaminophen (PERCOCET/ROXICET) 5-325 MG tablet Take 1-2 tablets by mouth every 4 (four) hours as needed for severe pain. 05/17/16   Gary Fleet, PA-C    Physical Exam Vitals: There were no vitals taken for this visit.  General: NAD HEENT: normocephalic, anicteric Thyroid: no enlargement, no palpable nodules Pulmonary: No increased work of  breathing, CTAB Cardiovascular: RRR, distal pulses 2+ Breast: Breast symmetrical, no tenderness, no palpable nodules or masses, no skin or nipple retraction present, no nipple discharge.  No axillary or supraclavicular lymphadenopathy. Abdomen: NABS, soft, non-tender, non-distended.  Umbilicus without lesions.  No hepatomegaly, splenomegaly or masses palpable. No evidence of hernia  Genitourinary:  External: Normal external female genitalia.  Normal urethral meatus, normal  Bartholin's and Skene's glands.    Vagina: Normal vaginal mucosa, no evidence of prolapse.    Cervix: Grossly normal in appearance, no bleeding  Uterus: Non-enlarged, mobile, normal contour.  No CMT  Adnexa: ovaries non-enlarged, no adnexal masses  Rectal: deferred  Lymphatic: no evidence of inguinal lymphadenopathy Extremities: no edema, erythema, or tenderness Neurologic: Grossly intact Psychiatric: mood appropriate, affect full  Female chaperone present for pelvic and breast  portions of the physical exam     Assessment: 52 y.o. No obstetric history on file. routine annual exam  Plan: Problem List Items Addressed This Visit    None  1) Mammogram - recommend yearly screening mammogram.  Mammogram {Blank single:19197::"Is up to date","Was ordered today"}  2) STI screening was offered and {Blank single:19197::"accepted","declined"}  3) ASCCP guidelines and rational discussed.  Patient opts for {Blank single:19197::"every 5 years","every 3 years","yearly","discontinue"} screening interval  4) Osteoporosis  - per USPTF routine screening DEXA at age 49 - FRAX 47 year major fracture risk ***,  10 year hip fracture risk ***  Consider FDA-approved medical therapies in postmenopausal women and men aged 33 years and older, based on the following: a) A hip or vertebral (clinical or morphometric) fracture b) T-score ? -2.5 at the femoral neck or spine after appropriate evaluation to exclude secondary causes C)  Low bone mass (T-score between -1.0 and -2.5 at the femoral neck or spine) and a 10-year probability of a hip fracture ? 3% or a 10-year probability of a major osteoporosis-related fracture ? 20% based on the [place]-adapted WHO algorithm   5) Routine healthcare maintenance including cholesterol, diabetes screening discussed {Blank single:19197::"managed by PCP","Ordered today","To return fasting at a later date","Declines"}  6) Colonoscopy UTD 09/03/2016  7) Follow up 1 year for routine annual

## 2017-12-14 ENCOUNTER — Encounter: Payer: Self-pay | Admitting: Obstetrics and Gynecology

## 2017-12-14 ENCOUNTER — Ambulatory Visit: Payer: Self-pay | Admitting: Obstetrics and Gynecology

## 2017-12-14 ENCOUNTER — Ambulatory Visit (INDEPENDENT_AMBULATORY_CARE_PROVIDER_SITE_OTHER): Payer: BLUE CROSS/BLUE SHIELD | Admitting: Obstetrics and Gynecology

## 2017-12-14 VITALS — BP 124/70 | HR 111 | Ht 61.0 in | Wt 223.0 lb

## 2017-12-14 DIAGNOSIS — Z1239 Encounter for other screening for malignant neoplasm of breast: Secondary | ICD-10-CM

## 2017-12-14 DIAGNOSIS — Z01419 Encounter for gynecological examination (general) (routine) without abnormal findings: Secondary | ICD-10-CM | POA: Diagnosis not present

## 2017-12-14 DIAGNOSIS — R351 Nocturia: Secondary | ICD-10-CM

## 2017-12-14 DIAGNOSIS — Z6841 Body Mass Index (BMI) 40.0 and over, adult: Secondary | ICD-10-CM

## 2017-12-14 DIAGNOSIS — Z1231 Encounter for screening mammogram for malignant neoplasm of breast: Secondary | ICD-10-CM | POA: Diagnosis not present

## 2017-12-14 DIAGNOSIS — N951 Menopausal and female climacteric states: Secondary | ICD-10-CM

## 2017-12-14 DIAGNOSIS — Z124 Encounter for screening for malignant neoplasm of cervix: Secondary | ICD-10-CM | POA: Diagnosis not present

## 2017-12-14 DIAGNOSIS — R631 Polydipsia: Secondary | ICD-10-CM

## 2017-12-14 MED ORDER — PHENTERMINE HCL 37.5 MG PO TABS: 37.5000 mg | ORAL_TABLET | Freq: Every day | ORAL | 0 refills | Status: DC

## 2017-12-14 NOTE — Progress Notes (Signed)
Patient ID: [name], female   DOB: December 28, 1965, 52 y.o.   MRN: [id number]     Gynecology Annual Exam  PCP: Lorelee Market, MD  Chief Complaint:  Chief Complaint  Patient presents with  . Gynecologic Exam    History of Present Illness:Patient is a 52 y.o. G0P0000 presents for annual exam. The patient has no complaints today.   LMP: No LMP recorded. Patient is postmenopausal.  The patient is sexually active. She denies dyspareunia.  The patient does perform self breast exams.  There is no notable family history of breast or ovarian cancer in her family.  The patient wears seatbelts: yes.   The patient has regular exercise: not asked.    The patient denies current symptoms of depression.      Patientis a 52 y.o. [place] female, who presents for the evaluation of weight gain. She has gained 10 pounds primarily over past year. The patient states the following issues have contributed to her weight problem: has had limited mobility secondary to some orthopedic/mobility issues.  The patient has no additional symptoms. The patient specifically denies memory loss, muscle weakness, excessive thirst, and polyuria. Weight related co-morbidities include HTN. She has tried phentermine  in the past with moderate success, and without aggravation of blood pressures.   Review of Systems: Review of Systems  Constitutional: Negative for chills and fever.  HENT: Negative for congestion.   Respiratory: Negative for cough and shortness of breath.   Cardiovascular: Negative for chest pain and palpitations.  Gastrointestinal: Negative for abdominal pain, constipation, diarrhea, heartburn, nausea and vomiting.  Genitourinary: Negative for dysuria, frequency and urgency.  Skin: Negative for itching and rash.  Neurological: Negative for dizziness and headaches.  Endo/Heme/Allergies: Negative for polydipsia.  Psychiatric/Behavioral: Negative for depression.    Past Medical History:  Past  Medical History:  Diagnosis Date  . Arthritis   . COPD (chronic obstructive pulmonary disease) ([place])   . Fibromyalgia   . Hypertension     Past Surgical History:  Past Surgical History:  Procedure Laterality Date  . COLONOSCOPY WITH PROPOFOL N/A 09/03/2016   Procedure: COLONOSCOPY WITH PROPOFOL;  Surgeon: [name], MD;  Location: [facility];  Service: Endoscopy;  Laterality: N/A;  . FRACTURE SURGERY     left foot   . KNEE CARTILAGE SURGERY  2015   left  . LAPAROSCOPIC SALPINGOOPHERECTOMY    . left hand     1 for ganglion cyst and next yr replace "joints" in left hand  . PLANTAR FASCIA SURGERY     left foot  . SHOULDER INJECTION Right 05/17/2016   Procedure: SHOULDER INJECTION;  Surgeon: Dorna Leitz, MD;  Location: [place];  Service: Orthopedics;  Laterality: Right;  . SHOULDER SURGERY     x 2 on right shoulder  . TONSILLECTOMY    . TOTAL KNEE ARTHROPLASTY Left 05/17/2016   Procedure: TOTAL KNEE ARTHROPLASTY;  Surgeon: Dorna Leitz, MD;  Location: [place];  Service: Orthopedics;  Laterality: Left;  . TOTAL SHOULDER ARTHROPLASTY Left 05/09/2015   Procedure: TOTAL SHOULDER ARTHROPLASTY;  Surgeon: Dorna Leitz, MD;  Location: [place];  Service: Orthopedics;  Laterality: Left;  . TUBAL LIGATION      Gynecologic History:  No LMP recorded. Patient is postmenopausal. Last Pap: Results were: 11/17/2016 NIL and HR HPV+   Obstetric History: G0P0000  Family History:  History reviewed. No pertinent family history.  Social History:  Social History   Socioeconomic History  . Marital status: Single    Spouse name:  Not on file  . Number of children: Not on file  . Years of education: Not on file  . Highest education level: Not on file  Social Needs  . Financial resource strain: Not on file  . Food insecurity - worry: Not on file  . Food insecurity - inability: Not on file  . Transportation needs - medical: Not on file  . Transportation needs - non-medical: Not on file  Occupational  History  . Not on file  Tobacco Use  . Smoking status: Current Some Day Smoker    Packs/day: 0.50    Years: 30.00    Pack years: 15.00    Types: Cigarettes  . Smokeless tobacco: Never Used  Substance and Sexual Activity  . Alcohol use: Yes    Comment: monthly  . Drug use: No  . Sexual activity: Yes    Birth control/protection: None  Other Topics Concern  . Not on file  Social History Narrative  . Not on file    Allergies:  Allergies  Allergen Reactions  . Augmentin [Amoxicillin-Pot Clavulanate] Anaphylaxis  . Bee Venom Hives and Shortness Of Breath    Treats with benadryl  . Diclofenac Sodium Itching and Swelling    Swelling of eyes and face PENNSAID- topical gel  . Other Hives and Shortness Of Breath    Reaction to spider bites (treats with benadryl)  . Sulfa Antibiotics Anaphylaxis and Other (See Comments)    Childhood allergic reaction Sulfonamides    Medications: Prior to Admission medications   Medication Sig Start Date End Date Taking? Authorizing Provider  amLODipine (NORVASC) 10 MG tablet Take 10 mg by mouth daily.   Yes [provider]  b complex vitamins tablet Take 1 tablet by mouth daily.   Yes [provider]  cholecalciferol (VITAMIN D) 1000 units tablet Take by mouth daily.    Yes [provider]  DULoxetine (CYMBALTA) 60 MG capsule Take 60 mg by mouth 2 (two) times daily.   Yes [provider]  hydrochlorothiazide (HYDRODIURIL) 12.5 MG tablet Take 12.5 mg by mouth daily.   Yes [provider]  HYDROcodone-acetaminophen (NORCO) 10-325 MG tablet Take 1 tablet by mouth every 6 (six) hours as needed.   Yes [provider]  losartan (COZAAR) 100 MG tablet Take 100 mg by mouth daily.   Yes [provider]  Multiple Vitamin (MULTIVITAMIN WITH MINERALS) TABS tablet Take 1 tablet by mouth daily.   Yes [provider]    Physical Exam Vitals: Blood pressure 124/70, pulse (!) 111, height 5'  1" (1.549 m), weight 223 lb (101.2 kg). Body mass index is 42.14 kg/m.  General: NAD HEENT: normocephalic, anicteric Thyroid: no enlargement, no palpable nodules Pulmonary: No increased work of breathing, CTAB Cardiovascular: RRR, distal pulses 2+ Breast: Breast symmetrical, no tenderness, no palpable nodules or masses, no skin or nipple retraction present, no nipple discharge.  No axillary or supraclavicular lymphadenopathy. Abdomen: NABS, soft, non-tender, non-distended.  Umbilicus without lesions.  No hepatomegaly, splenomegaly or masses palpable. No evidence of hernia  Genitourinary:  External: Normal external female genitalia.  Normal urethral meatus, normal  Bartholin's and Skene's glands.    Vagina: Normal vaginal mucosa, no evidence of prolapse.    Cervix: Grossly normal in appearance, no bleeding  Uterus: Non-enlarged, mobile, normal contour.  No CMT  Adnexa: ovaries non-enlarged, no adnexal masses  Rectal: deferred  Lymphatic: no evidence of inguinal lymphadenopathy Extremities: no edema, erythema, or tenderness Neurologic: Grossly intact Psychiatric: mood appropriate, affect  full  Female chaperone present for pelvic and breast  portions of the physical exam     Assessment: 51 y.o. G0P0000 routine annual exam  Plan: Problem List Items Addressed This Visit    None    Visit Diagnoses    Vasomotor symptoms due to menopause    -  Primary   Relevant Orders   TSH   Screening for malignant neoplasm of cervix       Relevant Orders   PapIG, HPV, rfx 16/18   Breast screening       Relevant Orders   MM DIGITAL SCREENING BILATERAL   Encounter for gynecological examination without abnormal finding       Relevant Orders   PapIG, HPV, rfx 16/18   TSH   Hemoglobin A1c   Nocturia more than twice per night       Relevant Orders   Hemoglobin A1c   Polydipsia       Relevant Orders   Hemoglobin A1c   Class 3 severe obesity with serious comorbidity and body mass index (BMI)  of 40.0 to 44.9 in adult, unspecified obesity type ([place])       Relevant Orders   TSH   Hemoglobin A1c     Annual  1) Mammogram - recommend yearly screening mammogram.  Mammogram Was ordered today  2) STI screening was not offwews  3) ASCCP guidelines and rational discussed.  Follow up pap today for NILM HPV+ pap last year  4) Osteoporosis  - per USPTF routine screening DEXA at age 15  5) Routine healthcare maintenance including cholesterol, diabetes screening discussed managed by PCP  6) Colonoscopy - up to date 2017  7) Discontinued gabapentin - still having moderate hot flashes   8) Follow up 1 year for routine annual  Weight loss  1) 1500 Calorie ADA Diet  2) Patient education given regarding appropriate lifestyle changes for weight loss including: regular physical activity, healthy coping strategies, caloric restriction and healthy eating patterns.  3) Patient will be started on weight loss medication. The risks and benefits and side effects of medication, such as Adipex (Phenteramine) ,  Tenuate (Diethylproprion), Belviq (lorcarsin), Contrave (buproprion/naltrexone), Qsymia (phentermine/topiramate), and Saxenda (liraglutide) is discussed. The pros and cons of suppressing appetite and boosting metabolism is discussed. Risks of tolerence and addiction is discussed for selected agents discussed. Use of medicine will ne short term, such as 3-4 months at a time followed by a period of time off of the medicine to avoid these risks and side effects for Adipex, Qsymia, and Tenuate discussed. Pt to call with any negative side effects and agrees to keep follow up appts.  4) Comorbidity Screening - hypothyroidism screening, diabetes, and hyperlipidemia screening offered and accepted  5) Encouraged weekly weight monitorig to track progress and sample 1 week food diary  6) Contraception - N/A postmenopausal  7) 15 minutes face-to-face; counseling/coordination of care > 50 percent of  visit  8) Follow up in 4 weeks to assess response

## 2017-12-14 NOTE — Patient Instructions (Signed)
Preventive Care 40-64 Years, Female Preventive care refers to lifestyle choices and visits with your health care provider that can promote health and wellness. What does preventive care include?  A yearly physical exam. This is also called an annual well check.  Dental exams once or twice a year.  Routine eye exams. Ask your health care provider how often you should have your eyes checked.  Personal lifestyle choices, including: ? Daily care of your teeth and gums. ? Regular physical activity. ? Eating a healthy diet. ? Avoiding tobacco and drug use. ? Limiting alcohol use. ? Practicing safe sex. ? Taking low-dose aspirin daily starting at age 58. ? Taking vitamin and mineral supplements as recommended by your health care provider. What happens during an annual well check? The services and screenings done by your health care provider during your annual well check will depend on your age, overall health, lifestyle risk factors, and family history of disease. Counseling Your health care provider may ask you questions about your:  Alcohol use.  Tobacco use.  Drug use.  Emotional well-being.  Home and relationship well-being.  Sexual activity.  Eating habits.  Work and work Statistician.  Method of birth control.  Menstrual cycle.  Pregnancy history.  Screening You may have the following tests or measurements:  Height, weight, and BMI.  Blood pressure.  Lipid and cholesterol levels. These may be checked every 5 years, or more frequently if you are over 81 years old.  Skin check.  Lung cancer screening. You may have this screening every year starting at age 78 if you have a 30-pack-year history of smoking and currently smoke or have quit within the past 15 years.  Fecal occult blood test (FOBT) of the stool. You may have this test every year starting at age 65.  Flexible sigmoidoscopy or colonoscopy. You may have a sigmoidoscopy every 5 years or a colonoscopy  every 10 years starting at age 30.  Hepatitis C blood test.  Hepatitis B blood test.  Sexually transmitted disease (STD) testing.  Diabetes screening. This is done by checking your blood sugar (glucose) after you have not eaten for a while (fasting). You may have this done every 1-3 years.  Mammogram. This may be done every 1-2 years. Talk to your health care provider about when you should start having regular mammograms. This may depend on whether you have a family history of breast cancer.  BRCA-related cancer screening. This may be done if you have a family history of breast, ovarian, tubal, or peritoneal cancers.  Pelvic exam and Pap test. This may be done every 3 years starting at age 80. Starting at age 36, this may be done every 5 years if you have a Pap test in combination with an HPV test.  Bone density scan. This is done to screen for osteoporosis. You may have this scan if you are at high risk for osteoporosis.  Discuss your test results, treatment options, and if necessary, the need for more tests with your health care provider. Vaccines Your health care provider may recommend certain vaccines, such as:  Influenza vaccine. This is recommended every year.  Tetanus, diphtheria, and acellular pertussis (Tdap, Td) vaccine. You may need a Td booster every 10 years.  Varicella vaccine. You may need this if you have not been vaccinated.  Zoster vaccine. You may need this after age 5.  Measles, mumps, and rubella (MMR) vaccine. You may need at least one dose of MMR if you were born in  1957 or later. You may also need a second dose.  Pneumococcal 13-valent conjugate (PCV13) vaccine. You may need this if you have certain conditions and were not previously vaccinated.  Pneumococcal polysaccharide (PPSV23) vaccine. You may need one or two doses if you smoke cigarettes or if you have certain conditions.  Meningococcal vaccine. You may need this if you have certain  conditions.  Hepatitis A vaccine. You may need this if you have certain conditions or if you travel or work in places where you may be exposed to hepatitis A.  Hepatitis B vaccine. You may need this if you have certain conditions or if you travel or work in places where you may be exposed to hepatitis B.  Haemophilus influenzae type b (Hib) vaccine. You may need this if you have certain conditions.  Talk to your health care provider about which screenings and vaccines you need and how often you need them. This information is not intended to replace advice given to you by your health care provider. Make sure you discuss any questions you have with your health care provider. Document Released: 11/21/2015 Document Revised: 07/14/2016 Document Reviewed: 08/26/2015 Elsevier Interactive Patient Education  2018 [employer]  

## 2017-12-15 LAB — HEMOGLOBIN A1C
Est. average glucose Bld gHb Est-mCnc: 123 mg/dL
Hgb A1c MFr Bld: 5.9 % — ABNORMAL HIGH (ref 4.8–5.6)

## 2017-12-15 LAB — TSH: TSH: 0.76 u[IU]/mL (ref 0.450–4.500)

## 2017-12-16 LAB — PAPIG, HPV, RFX 16/18
HPV, high-risk: NEGATIVE
PAP Smear Comment: 0

## 2018-01-11 ENCOUNTER — Ambulatory Visit: Payer: BLUE CROSS/BLUE SHIELD | Admitting: Obstetrics and Gynecology

## 2018-01-11 ENCOUNTER — Ambulatory Visit: Payer: BLUE CROSS/BLUE SHIELD

## 2018-04-17 ENCOUNTER — Ambulatory Visit (INDEPENDENT_AMBULATORY_CARE_PROVIDER_SITE_OTHER): Payer: BLUE CROSS/BLUE SHIELD | Admitting: Podiatry

## 2018-04-17 ENCOUNTER — Encounter: Payer: Self-pay | Admitting: Podiatry

## 2018-04-17 ENCOUNTER — Ambulatory Visit (INDEPENDENT_AMBULATORY_CARE_PROVIDER_SITE_OTHER): Payer: BLUE CROSS/BLUE SHIELD

## 2018-04-17 VITALS — BP 124/77 | HR 91 | Resp 18

## 2018-04-17 DIAGNOSIS — M778 Other enthesopathies, not elsewhere classified: Secondary | ICD-10-CM

## 2018-04-17 DIAGNOSIS — M19072 Primary osteoarthritis, left ankle and foot: Secondary | ICD-10-CM | POA: Diagnosis not present

## 2018-04-17 DIAGNOSIS — M19071 Primary osteoarthritis, right ankle and foot: Secondary | ICD-10-CM

## 2018-04-17 DIAGNOSIS — M779 Enthesopathy, unspecified: Secondary | ICD-10-CM

## 2018-04-17 NOTE — Progress Notes (Signed)
Subjective:  Patient ID: [name], female    DOB: 10/25/1966,  MRN: [id number] HPI Chief Complaint  Patient presents with  . Foot Pain    Dorsal midfoot bilateral (L>R) - knots on top, swelling, aching and burning x years, ortho xrayed said they didn't see anything, history of fractures in foot  . New Patient (Initial Visit)    Est pt 3+    52 y.o. female presents with the above complaint.   ROS: Denies fever chills nausea vomiting muscle aches pains calf pain back pain chest pain shortness of breath headache.  Past Medical History:  Diagnosis Date  . Arthritis   . COPD (chronic obstructive pulmonary disease) ([place])   . Fibromyalgia   . Hypertension    Past Surgical History:  Procedure Laterality Date  . COLONOSCOPY WITH PROPOFOL N/A 09/03/2016   Procedure: COLONOSCOPY WITH PROPOFOL;  Surgeon: [name], MD;  Location: [facility];  Service: Endoscopy;  Laterality: N/A;  . FRACTURE SURGERY     left foot   . KNEE CARTILAGE SURGERY  2015   left  . LAPAROSCOPIC SALPINGOOPHERECTOMY    . left hand     1 for ganglion cyst and next yr replace "joints" in left hand  . PLANTAR FASCIA SURGERY     left foot  . SHOULDER INJECTION Right 05/17/2016   Procedure: SHOULDER INJECTION;  Surgeon: Dorna Leitz, MD;  Location: [place];  Service: Orthopedics;  Laterality: Right;  . SHOULDER SURGERY     x 2 on right shoulder  . TONSILLECTOMY    . TOTAL KNEE ARTHROPLASTY Left 05/17/2016   Procedure: TOTAL KNEE ARTHROPLASTY;  Surgeon: Dorna Leitz, MD;  Location: [place];  Service: Orthopedics;  Laterality: Left;  . TOTAL SHOULDER ARTHROPLASTY Left 05/09/2015   Procedure: TOTAL SHOULDER ARTHROPLASTY;  Surgeon: Dorna Leitz, MD;  Location: [place];  Service: Orthopedics;  Laterality: Left;  . TUBAL LIGATION      Current Outpatient Medications:  .  hydrochlorothiazide (HYDRODIURIL) 25 MG tablet, Take 25 mg by mouth daily., Disp: , Rfl:  .  amLODipine (NORVASC) 10 MG tablet, Take 10 mg by mouth  daily., Disp: , Rfl:  .  b complex vitamins tablet, Take 1 tablet by mouth daily., Disp: , Rfl:  .  cholecalciferol (VITAMIN D) 1000 units tablet, Take by mouth daily. , Disp: , Rfl:  .  DULoxetine (CYMBALTA) 60 MG capsule, Take 60 mg by mouth 2 (two) times daily., Disp: , Rfl:  .  glucosamine-chondroitin 500-400 MG tablet, Take by mouth., Disp: , Rfl:  .  HYDROcodone-acetaminophen (NORCO) 10-325 MG tablet, Take 1 tablet by mouth every 6 (six) hours as needed., Disp: , Rfl:  .  ibuprofen (ADVIL,MOTRIN) 800 MG tablet, Take 800 mg by mouth 2 (two) times daily as needed., Disp: , Rfl: 0 .  losartan (COZAAR) 100 MG tablet, Take 100 mg by mouth daily., Disp: , Rfl:  .  Multiple Vitamin (MULTIVITAMIN WITH MINERALS) TABS tablet, Take 1 tablet by mouth daily., Disp: , Rfl:   Allergies  Allergen Reactions  . Augmentin [Amoxicillin-Pot Clavulanate] Anaphylaxis  . Bee Venom Hives and Shortness Of Breath    Treats with benadryl  . Diclofenac Sodium Itching and Swelling    Swelling of eyes and face PENNSAID- topical gel  . Other Hives and Shortness Of Breath    Reaction to spider bites (treats with benadryl)  . Sulfa Antibiotics Anaphylaxis and Other (See Comments)    Childhood allergic reaction Sulfonamides   Review of Systems  Objective:   Vitals:   04/17/18 1118  BP: 124/77  Pulse: 91  Resp: 18    General: Well developed, nourished, in no acute distress, alert and oriented x3   Dermatological: Skin is warm, dry and supple bilateral. Nails x 10 are well maintained; remaining integument appears unremarkable at this time. There are no open sores, no preulcerative lesions, no rash or signs of infection present.  Vascular: Dorsalis Pedis artery and Posterior Tibial artery pedal pulses are 2/4 bilateral with immedate capillary fill time. Pedal hair growth present. No varicosities and no lower extremity edema present bilateral.   Neruologic: Grossly intact via light touch bilateral. Vibratory  intact via tuning fork bilateral. Protective threshold with Semmes Wienstein monofilament intact to all pedal sites bilateral. Patellar and Achilles deep tendon reflexes 2+ bilateral. No Babinski or clonus noted bilateral.   Musculoskeletal: No gross boney pedal deformities bilateral. No pain, crepitus, or limitation noted with foot and ankle range of motion bilateral. Muscular strength 5/5 in all groups tested bilateral.  Gait: Unassisted, Nonantalgic.    Radiographs:  3 views bilateral foot taken today demonstrate significant midfoot osteoarthritic changes with dorsal spurring soft tissue swelling over the midfoot.  No other acute findings are noted.  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  Plan: After sterile Betadine skin prep injected 20 mg of Kenalog 5 mg Marcaine dorsal aspect of the bilateral foot at the point of maximal tenderness.  She tolerated procedure well without complications follow-up with her in the near future for surgical intervention if necessary.  She has had orthotics in the past and does not want to retry that.     Johnston Maddocks T. [place], [place]

## 2018-04-30 ENCOUNTER — Emergency Department: Payer: BLUE CROSS/BLUE SHIELD

## 2018-04-30 ENCOUNTER — Emergency Department
Admission: EM | Admit: 2018-04-30 | Discharge: 2018-04-30 | Disposition: A | Discharge status: Home / Self Care | Payer: BLUE CROSS/BLUE SHIELD | Attending: Emergency Medicine | Admitting: Emergency Medicine

## 2018-04-30 ENCOUNTER — Encounter: Payer: Self-pay | Admitting: Emergency Medicine

## 2018-04-30 DIAGNOSIS — Y9389 Activity, other specified: Secondary | ICD-10-CM | POA: Insufficient documentation

## 2018-04-30 DIAGNOSIS — Z96652 Presence of left artificial knee joint: Secondary | ICD-10-CM | POA: Diagnosis not present

## 2018-04-30 DIAGNOSIS — Z96612 Presence of left artificial shoulder joint: Secondary | ICD-10-CM | POA: Insufficient documentation

## 2018-04-30 DIAGNOSIS — J449 Chronic obstructive pulmonary disease, unspecified: Secondary | ICD-10-CM | POA: Insufficient documentation

## 2018-04-30 DIAGNOSIS — X58XXXA Exposure to other specified factors, initial encounter: Secondary | ICD-10-CM | POA: Diagnosis not present

## 2018-04-30 DIAGNOSIS — Y92531 Health care provider office as the place of occurrence of the external cause: Secondary | ICD-10-CM | POA: Diagnosis not present

## 2018-04-30 DIAGNOSIS — Y999 Unspecified external cause status: Secondary | ICD-10-CM | POA: Insufficient documentation

## 2018-04-30 DIAGNOSIS — Z79899 Other long term (current) drug therapy: Secondary | ICD-10-CM | POA: Diagnosis not present

## 2018-04-30 DIAGNOSIS — I10 Essential (primary) hypertension: Secondary | ICD-10-CM | POA: Diagnosis not present

## 2018-04-30 DIAGNOSIS — S2232XA Fracture of one rib, left side, initial encounter for closed fracture: Secondary | ICD-10-CM

## 2018-04-30 DIAGNOSIS — Z7982 Long term (current) use of aspirin: Secondary | ICD-10-CM | POA: Diagnosis not present

## 2018-04-30 DIAGNOSIS — F17210 Nicotine dependence, cigarettes, uncomplicated: Secondary | ICD-10-CM | POA: Diagnosis not present

## 2018-04-30 DIAGNOSIS — M25512 Pain in left shoulder: Secondary | ICD-10-CM | POA: Diagnosis present

## 2018-04-30 MED ORDER — ORPHENADRINE CITRATE ER 100 MG PO TB12: 100.0000 mg | ORAL_TABLET | Freq: Two times a day (BID) | ORAL | 0 refills | Status: DC | PRN

## 2018-04-30 MED ORDER — ORPHENADRINE CITRATE 30 MG/ML IJ SOLN
60.0000 mg | INTRAMUSCULAR | Status: AC
  Administered 2018-04-30: 60 mg via INTRAMUSCULAR
  Filled 2018-04-30: qty 2

## 2018-04-30 MED ORDER — KETOROLAC TROMETHAMINE 30 MG/ML IJ SOLN
30.0000 mg | Freq: Once | INTRAMUSCULAR | Status: AC
  Administered 2018-04-30: 30 mg via INTRAMUSCULAR
  Filled 2018-04-30: qty 1

## 2018-04-30 MED ORDER — KETOROLAC TROMETHAMINE 10 MG PO TABS: 10.0000 mg | ORAL_TABLET | Freq: Three times a day (TID) | ORAL | 0 refills | Status: DC

## 2018-04-30 NOTE — Discharge Instructions (Addendum)
Your exam and x-ray reveals a single, non-displaced rib fracture at the 6th rib. Take the meds as directed. Use a pillow to splint for coughing and sneezing. Apply ice to reduce pain. Following up with your provider for ongoing symptoms.

## 2018-04-30 NOTE — ED Provider Notes (Signed)
[facility] Emergency Department Provider Note ____________________________________________  Time seen: 1628  I have reviewed the triage vital signs and the nursing notes.  HISTORY  Chief Complaint  Shoulder Pain  HPI [name] is a 52 y.o. female presents to the ED accompanied by daughter, for evaluation of left-sided shoulder pain.  Patient reports onset was about a week prior, after a visit to a Curator.  She describes that during the adjustment session, the chiropractor caused some immediate pain and spasm to the posterior mid back at the shoulder blade.  Patient reports that after about 15 minutes the pain and spasm resolved spontaneously.  Since that time however, the patient has had intermittent Spasms and chest wall pain that helps her breathing when it catches.  She also describes what she calls a "clicking" that she localizes from the anterior chest wall onto her left breast and through to the left shoulder blade.  She reports increased pain with attempts to lie flat.  Over the last several days she has been sleeping in a reclining chair.  She denies any fevers, chills, sweats.  She also denies any nausea, vomiting, or shortness of breath.  She is without any dyspnea on exertion, exertional chest pain, or referral into the neck and/or upper extremity.  She denies any productive cough, abdominal pain, or syncope. She is a current everyday smoker and reports a questionable history of COPD.  She is status post a left total shoulder arthroplasty about 2 years prior.   Past Medical History:  Diagnosis Date  . Arthritis   . COPD (chronic obstructive pulmonary disease) ([place])   . Fibromyalgia   . Hypertension     Patient Active Problem List   Diagnosis Date Noted  . Benign neoplasm of cecum   . Special screening for malignant neoplasms, colon   . Primary osteoarthritis of left knee 05/17/2016  . Tendinitis of right rotator cuff 05/17/2016  .  Osteoarthritis of left shoulder 05/09/2015  . TOBACCO ABUSE 06/20/2008  . CONSTIPATION 06/18/2008  . HEART MURMUR, BENIGN 06/18/2008  . OTHER RESPIRATORY COMPLICATIONS 25/42/7062    Past Surgical History:  Procedure Laterality Date  . COLONOSCOPY WITH PROPOFOL N/A 09/03/2016   Procedure: COLONOSCOPY WITH PROPOFOL;  Surgeon: [name], MD;  Location: [facility];  Service: Endoscopy;  Laterality: N/A;  . FRACTURE SURGERY     left foot   . KNEE CARTILAGE SURGERY  2015   left  . LAPAROSCOPIC SALPINGOOPHERECTOMY    . left hand     1 for ganglion cyst and next yr replace "joints" in left hand  . PLANTAR FASCIA SURGERY     left foot  . SHOULDER INJECTION Right 05/17/2016   Procedure: SHOULDER INJECTION;  Surgeon: Dorna Leitz, MD;  Location: [place];  Service: Orthopedics;  Laterality: Right;  . SHOULDER SURGERY     x 2 on right shoulder  . TONSILLECTOMY    . TOTAL KNEE ARTHROPLASTY Left 05/17/2016   Procedure: TOTAL KNEE ARTHROPLASTY;  Surgeon: Dorna Leitz, MD;  Location: [place];  Service: Orthopedics;  Laterality: Left;  . TOTAL SHOULDER ARTHROPLASTY Left 05/09/2015   Procedure: TOTAL SHOULDER ARTHROPLASTY;  Surgeon: Dorna Leitz, MD;  Location: [place];  Service: Orthopedics;  Laterality: Left;  . TUBAL LIGATION      Prior to Admission medications   Medication Sig Start Date End Date Taking? Authorizing Provider  amLODipine (NORVASC) 10 MG tablet Take 10 mg by mouth daily.    [provider]  b complex vitamins tablet Take 1 tablet by mouth daily.    [provider]  cholecalciferol (VITAMIN D) 1000 units tablet Take by mouth daily.     [provider]  DULoxetine (CYMBALTA) 60 MG capsule Take 60 mg by mouth 2 (two) times daily.    [provider]  glucosamine-chondroitin 500-400 MG tablet Take by mouth.    [provider]  hydrochlorothiazide (HYDRODIURIL) 25 MG tablet Take 25 mg by mouth daily.    [provider]   HYDROcodone-acetaminophen (NORCO) 10-325 MG tablet Take 1 tablet by mouth every 6 (six) hours as needed.    [provider]  ibuprofen (ADVIL,MOTRIN) 800 MG tablet Take 800 mg by mouth 2 (two) times daily as needed. 03/27/18   [provider]  ketorolac (TORADOL) 10 MG tablet Take 1 tablet (10 mg total) by mouth every 8 (eight) hours. 04/30/18   Konor Noren, Dannielle Karvonen, PA-C  losartan (COZAAR) 100 MG tablet Take 100 mg by mouth daily.    [provider]  Multiple Vitamin (MULTIVITAMIN WITH MINERALS) TABS tablet Take 1 tablet by mouth daily.    [provider]  orphenadrine (NORFLEX) 100 MG tablet Take 1 tablet (100 mg total) by mouth 2 (two) times daily as needed for muscle spasms. 04/30/18   Ahmaya Ostermiller, Dannielle Karvonen, PA-C    Allergies Augmentin [amoxicillin-pot clavulanate]; Bee venom; Diclofenac sodium; Other; and Sulfa antibiotics  No family history on file.  Social History Social History   Tobacco Use  . Smoking status: Current Some Day Smoker    Packs/day: 0.50    Years: 30.00    Pack years: 15.00    Types: Cigarettes  . Smokeless tobacco: Never Used  Substance Use Topics  . Alcohol use: Yes    Comment: monthly  . Drug use: No    Review of Systems  Constitutional: Negative for fever, chills, or sweats Eyes: Negative for visual changes. ENT: Negative for sore throat. Cardiovascular: Negative for chest pain, DOE, or orthopnea Respiratory: Negative for shortness of breath, cough, or hemoptysis. Gastrointestinal: Negative for abdominal pain, vomiting and diarrhea. Genitourinary: Negative for dysuria. Musculoskeletal: Positive for midback pain. Skin: Negative for rash. Neurological: Negative for headaches, focal weakness or numbness. ____________________________________________  PHYSICAL EXAM:  VITAL SIGNS: ED Triage Vitals  Enc Vitals Group     BP 04/30/18 1533 136/64     Pulse Rate 04/30/18 1533 89     Resp 04/30/18 1533 16      Temp 04/30/18 1533 98.5 F (36.9 C)     Temp Source 04/30/18 1533 Oral     SpO2 04/30/18 1533 97 %     Weight 04/30/18 1534 220 lb (99.8 kg)     Height 04/30/18 1534 5\' 1"  (1.549 m)     Head Circumference --      Peak Flow --      Pain Score 04/30/18 1533 7     Pain Loc --      Pain Edu? --      Excl. in [place]? --     Constitutional: Alert and oriented. Well appearing and in no distress. Head: Normocephalic and atraumatic. Eyes: Conjunctivae are normal. Normal extraocular movements Neck: Supple. No thyromegaly. Cardiovascular: Normal rate, regular rhythm. Normal distal pulses.  No murmurs, rubs, or gallops. Respiratory: Normal respiratory effort. No wheezes/rales/rhonchi. Musculoskeletal: No spinal alignment without midline tenderness, spasm, deformity, or step-off.  Patient with tenderness to palpation to the posterior medial scapular border at the 5th & 6th  ribs.  Nontender with normal range of motion in all extremities.  Neurologic:  Normal gait without ataxia. Normal speech and language. No gross focal neurologic deficits are appreciated. Skin:  Skin is warm, dry and intact. No rash noted. ____________________________________________   RADIOLOGY  Left Rib Detail w/CXR  IMPRESSION: 1. Subtle nondisplaced fracture in the anterolateral aspect of the left sixth rib. No associated pneumothorax. 2. New linear opacities in the lungs bilaterally (left greater than right), favored to represent areas of atelectasis and/or scarring. 3. Aortic atherosclerosis.  ____________________________________________  PROCEDURES  Procedures Toradol 30 mg IM Norflex 60 mg IM ____________________________________________  INITIAL IMPRESSION / ASSESSMENT AND PLAN / ED COURSE  Patient with ED evaluation of sudden left chest wall and rib pain.  Patient's exam and x-ray confirms a nondisplaced sixth rib fracture on the left.  She has some scarring atelectasis considered to be nonacute on plain  films.  Patient will be discharged with prescriptions for ketorolac and Norflex dose as directed.  He is given instructions on management of closed rib fractures.  She will follow with primary provider or return to the ED as needed. ____________________________________________  FINAL CLINICAL IMPRESSION(S) / ED DIAGNOSES  Final diagnoses:  Closed fracture of one rib of left side, initial encounter      Melvenia Needles, PA-C 04/30/18 [place], [place], MD 05/02/18 2039

## 2018-04-30 NOTE — ED Triage Notes (Signed)
Patient presents to the ED with left shoulder pain intermittently x 1 week.  Patient states pain began after going to the chiropractor last week.  Patient states she is feeling a "clicking" that goes from under her left breast into her left shoulder blade when she moves.  Patient reports pain with lying down.

## 2018-08-07 ENCOUNTER — Encounter

## 2018-08-07 ENCOUNTER — Encounter: Payer: Self-pay | Admitting: Podiatry

## 2018-08-07 ENCOUNTER — Ambulatory Visit: Payer: BLUE CROSS/BLUE SHIELD

## 2018-08-07 ENCOUNTER — Ambulatory Visit (INDEPENDENT_AMBULATORY_CARE_PROVIDER_SITE_OTHER): Payer: BLUE CROSS/BLUE SHIELD | Admitting: Podiatry

## 2018-08-07 DIAGNOSIS — M7751 Other enthesopathy of right foot: Secondary | ICD-10-CM

## 2018-08-07 DIAGNOSIS — M779 Enthesopathy, unspecified: Principal | ICD-10-CM

## 2018-08-07 DIAGNOSIS — M778 Other enthesopathies, not elsewhere classified: Secondary | ICD-10-CM

## 2018-08-07 DIAGNOSIS — M7752 Other enthesopathy of left foot: Secondary | ICD-10-CM | POA: Diagnosis not present

## 2018-08-07 DIAGNOSIS — M79672 Pain in left foot: Principal | ICD-10-CM

## 2018-08-07 DIAGNOSIS — M79671 Pain in right foot: Secondary | ICD-10-CM

## 2018-08-07 NOTE — Progress Notes (Signed)
   HPI: 52 year old female presents the office today for evaluation of a recurrent pain to the dorsal aspect of the midfoot bilateral.  Patient states that she has large knots on top of her feet that swell occasionally.  This is been ongoing for several years.  Patient was last seen in the office on 04/17/2018 and anti-inflammatory injections were provided.  Patient gets significant relief from the injections.  She says that the pain is come back over the past few weeks.  She is requesting additional injections to calm the inflammation of the bilateral feet  Past Medical History:  Diagnosis Date  . Arthritis   . COPD (chronic obstructive pulmonary disease) ([place])   . Fibromyalgia   . Hypertension      Physical Exam: General: The patient is alert and oriented x3 in no acute distress.  Dermatology: Skin is warm, dry and supple bilateral lower extremities. Negative for open lesions or macerations.  Vascular: Palpable pedal pulses bilaterally. No edema or erythema noted. Capillary refill within normal limits.  Neurological: Epicritic and protective threshold grossly intact bilaterally.   Musculoskeletal Exam: Large palpable soft tissue knots noted to the dorsum of the bilateral feet.  Pain on palpation also noted.  Range of motion within normal limits to all pedal and ankle joints bilateral. Muscle strength 5/5 in all groups bilateral.   Assessment: 1.  Capsulitis dorsal aspect of the bilateral midfoot   Plan of Care:  1. Patient evaluated.  2.  Injection of 0.5 cc Celestone Soluspan injection the bilateral midfoot joint 3.  Continue Motrin 800 mg as per pain management 4.  Continue Vicodin 10/325 mg as per pain management 5.  Recommend good supportive tennis shoes that do not aggravate the dorsal aspect of the foot 6.  Return to clinic as needed  *Works at [employer] on [employer] all day      Edrick Kins, DPM [facility]  Dr. Edrick Kins, DPM    [address], [place] [address]                Office [phone]  Fax [phone]

## 2018-10-24 ENCOUNTER — Ambulatory Visit (INDEPENDENT_AMBULATORY_CARE_PROVIDER_SITE_OTHER): Payer: BLUE CROSS/BLUE SHIELD | Admitting: Podiatry

## 2018-10-24 ENCOUNTER — Encounter: Payer: Self-pay | Admitting: Podiatry

## 2018-10-24 DIAGNOSIS — M779 Enthesopathy, unspecified: Secondary | ICD-10-CM | POA: Diagnosis not present

## 2018-10-24 DIAGNOSIS — M722 Plantar fascial fibromatosis: Secondary | ICD-10-CM | POA: Diagnosis not present

## 2018-10-24 DIAGNOSIS — M778 Other enthesopathies, not elsewhere classified: Secondary | ICD-10-CM

## 2018-10-24 NOTE — Progress Notes (Signed)
   HPI: 52 year old female presenting today for follow up evaluation of capsulitis of the bilateral midfoot. She states she is currently having a flare up of the pain. She states the injections provided temporary relief. She also noted pain to the left heel that began about two months ago. She has been taking Vicodin and Motrin for pain. Walking increases her pain. She has had plantar fascial surgery in 1999. Patient is here for further evaluation and treatment.   Past Medical History:  Diagnosis Date  . Arthritis   . COPD (chronic obstructive pulmonary disease) ([place])   . Fibromyalgia   . Hypertension      Physical Exam: General: The patient is alert and oriented x3 in no acute distress.  Dermatology: Skin is warm, dry and supple bilateral lower extremities. Negative for open lesions or macerations.  Vascular: Palpable pedal pulses bilaterally. No edema or erythema noted. Capillary refill within normal limits.  Neurological: Epicritic and protective threshold grossly intact bilaterally.   Musculoskeletal Exam: Large palpable soft tissue knots noted to the dorsum of the bilateral feet with pain on palpation also noted. Pain with palpation noted to the left heel along the plantar fascia. Range of motion within normal limits to all pedal and ankle joints bilateral. Muscle strength 5/5 in all groups bilateral.   Assessment: 1. Capsulitis dorsal aspect of the bilateral midfoot 2. Plantar fasciitis left    Plan of Care:  1. Patient evaluated.  2.  Injection of 0.5 cc Celestone Soluspan injection the bilateral midfoot joint 3.  Injection of 0.5 mLs Celestone Soluspan injected into the left heel.  4. Continue Motrin 800 mg as per pain management 5. Continue Vicodin 10/325 mg as per pain management 6. Recommend good supportive tennis shoes that do not aggravate the dorsal aspect of the foot 7. Return to clinic in 6 weeks.  *Works at [employer] on [address] all day      Edrick Kins,  DPM [facility]  Dr. Edrick Kins, DPM    [address], [place] [address]                Office [phone]  Fax [phone]

## 2018-12-18 ENCOUNTER — Ambulatory Visit: Payer: BLUE CROSS/BLUE SHIELD | Admitting: Obstetrics and Gynecology

## 2019-01-15 ENCOUNTER — Telehealth: Payer: Self-pay

## 2019-01-15 ENCOUNTER — Ambulatory Visit (INDEPENDENT_AMBULATORY_CARE_PROVIDER_SITE_OTHER): Payer: BLUE CROSS/BLUE SHIELD | Admitting: Obstetrics and Gynecology

## 2019-01-15 ENCOUNTER — Encounter: Payer: Self-pay | Admitting: Obstetrics and Gynecology

## 2019-01-15 ENCOUNTER — Other Ambulatory Visit: Payer: Self-pay | Admitting: Obstetrics and Gynecology

## 2019-01-15 VITALS — BP 126/60 | HR 105 | Ht 61.0 in | Wt 218.0 lb

## 2019-01-15 DIAGNOSIS — Z1239 Encounter for other screening for malignant neoplasm of breast: Secondary | ICD-10-CM

## 2019-01-15 DIAGNOSIS — J0111 Acute recurrent frontal sinusitis: Secondary | ICD-10-CM

## 2019-01-15 DIAGNOSIS — Z01419 Encounter for gynecological examination (general) (routine) without abnormal findings: Secondary | ICD-10-CM

## 2019-01-15 DIAGNOSIS — F331 Major depressive disorder, recurrent, moderate: Secondary | ICD-10-CM

## 2019-01-15 MED ORDER — VENLAFAXINE HCL ER 75 MG PO CP24: 75.0000 mg | ORAL_CAPSULE | Freq: Every day | ORAL | 2 refills | Status: DC

## 2019-01-15 MED ORDER — LEVOFLOXACIN 500 MG PO TABS: 500.0000 mg | ORAL_TABLET | Freq: Every day | ORAL | 0 refills | Status: AC

## 2019-01-15 MED ORDER — TRAZODONE HCL 100 MG PO TABS: 100.0000 mg | ORAL_TABLET | Freq: Every evening | ORAL | 2 refills | Status: DC | PRN

## 2019-01-15 MED ORDER — VENLAFAXINE HCL ER 75 MG PO CP24: 150.0000 mg | ORAL_CAPSULE | Freq: Every day | ORAL | 2 refills | Status: DC

## 2019-01-15 NOTE — Progress Notes (Signed)
Gynecology Annual Exam  PCP: Lorelee Market, MD  Chief Complaint:  Chief Complaint  Patient presents with  . Gynecologic Exam    History of Present Illness:Patient is a 53 y.o. G0P0000 presents for annual exam. The patient has no complaints today.   LMP: No LMP recorded. Patient is postmenopausal. No postmenopausal bleeding  The patient is sexually active. She denies dyspareunia.  The patient does perform self breast exams.  There is no notable family history of breast or ovarian cancer in her family.  The patient wears seatbelts: yes.   The patient has regular exercise: not asked.    The patient reports current symptoms of depression.   Has been on Cymbalta for fibromyalgia.  No SI/HI.  Once monthly symptoms of depression worsen   Also reports symptoms consistent with acute bacterial sinusitis.  Reports has this relatively frequently.  Secondary to PCN allergy usually treated with levaquin.  No fevers no chills.  Sinus pressures, headaches, and rhinorrhea reported.    Review of Systems: Review of Systems  Constitutional: Negative for chills and fever.  HENT: Negative for congestion.   Respiratory: Negative for cough and shortness of breath.   Cardiovascular: Negative for chest pain and palpitations.  Gastrointestinal: Negative for abdominal pain, constipation, diarrhea, heartburn, nausea and vomiting.  Genitourinary: Negative for dysuria, frequency and urgency.  Skin: Negative for itching and rash.  Neurological: Negative for dizziness and headaches.  Endo/Heme/Allergies: Negative for polydipsia.  Psychiatric/Behavioral: Positive for depression. Negative for hallucinations, memory loss, substance abuse and suicidal ideas. The patient is nervous/anxious and has insomnia.     Past Medical History:  Past Medical History:  Diagnosis Date  . Arthritis   . COPD (chronic obstructive pulmonary disease) ([place])   . Fibromyalgia   . Hypertension     Past Surgical History:   Past Surgical History:  Procedure Laterality Date  . COLONOSCOPY WITH PROPOFOL N/A 09/03/2016   Procedure: COLONOSCOPY WITH PROPOFOL;  Surgeon: [name], MD;  Location: [facility];  Service: Endoscopy;  Laterality: N/A;  . FRACTURE SURGERY     left foot   . KNEE CARTILAGE SURGERY  2015   left  . LAPAROSCOPIC SALPINGOOPHERECTOMY    . left hand     1 for ganglion cyst and next yr replace "joints" in left hand  . PLANTAR FASCIA SURGERY     left foot  . SHOULDER INJECTION Right 05/17/2016   Procedure: SHOULDER INJECTION;  Surgeon: Dorna Leitz, MD;  Location: [facility];  Service: Orthopedics;  Laterality: Right;  . SHOULDER SURGERY     x 2 on right shoulder  . TONSILLECTOMY    . TOTAL KNEE ARTHROPLASTY Left 05/17/2016   Procedure: TOTAL KNEE ARTHROPLASTY;  Surgeon: Dorna Leitz, MD;  Location: [facility];  Service: Orthopedics;  Laterality: Left;  . TOTAL SHOULDER ARTHROPLASTY Left 05/09/2015   Procedure: TOTAL SHOULDER ARTHROPLASTY;  Surgeon: Dorna Leitz, MD;  Location: [facility];  Service: Orthopedics;  Laterality: Left;  . TUBAL LIGATION      Gynecologic History:  No LMP recorded. Patient is postmenopausal. Last Pap: Results were: 12/14/2017 NIL and HR HPV negative  11/17/2016 NIL HR HPV + Last mammogram: 01/12/2018 Results were: BI-RAD I  Obstetric History: G0P0000  Family History:  History reviewed. No pertinent family history.  Social History:  Social History   Socioeconomic History  . Marital status: Single    Spouse name: Not on file  . Number of children: Not on file  . Years of education: Not  on file  . Highest education level: Not on file  Occupational History  . Not on file  Social Needs  . Financial resource strain: Not on file  . Food insecurity:    Worry: Not on file    Inability: Not on file  . Transportation needs:    Medical: Not on file    Non-medical: Not on file  Tobacco Use  . Smoking status: Current Some Day Smoker    Packs/day: 0.50    Years: 30.00     Pack years: 15.00    Types: Cigarettes  . Smokeless tobacco: Never Used  Substance and Sexual Activity  . Alcohol use: Yes    Comment: monthly  . Drug use: No  . Sexual activity: Yes    Birth control/protection: None  Lifestyle  . Physical activity:    Days per week: 7 days    Minutes per session: 150+ min  . Stress: Rather much  Relationships  . Social connections:    Talks on phone: More than three times a week    Gets together: More than three times a week    Attends religious service: More than 4 times per year    Active member of club or organization: No    Attends meetings of clubs or organizations: Never    Relationship status: Never married  . Intimate partner violence:    Fear of current or ex partner: No    Emotionally abused: No    Physically abused: No    Forced sexual activity: No  Other Topics Concern  . Not on file  Social History Narrative  . Not on file    Allergies:  Allergies  Allergen Reactions  . Augmentin [Amoxicillin-Pot Clavulanate] Anaphylaxis  . Bee Venom Hives and Shortness Of Breath    Treats with benadryl  . Diclofenac Sodium Itching and Swelling    Swelling of eyes and face PENNSAID- topical gel  . Other Hives and Shortness Of Breath    Reaction to spider bites (treats with benadryl)  . Sulfa Antibiotics Anaphylaxis and Other (See Comments)    Childhood allergic reaction Sulfonamides    Medications: Prior to Admission medications   Medication Sig Start Date End Date Taking? Authorizing Provider  amLODipine (NORVASC) 10 MG tablet Take 10 mg by mouth daily.   Yes [provider]  b complex vitamins tablet Take 1 tablet by mouth daily.   Yes [provider]  cholecalciferol (VITAMIN D) 1000 units tablet Take by mouth daily.    Yes [provider]  DULoxetine (CYMBALTA) 60 MG capsule Take 60 mg by mouth 2 (two) times daily.   Yes [provider]  glucosamine-chondroitin 500-400 MG tablet Take by  mouth.   Yes [provider]  hydrochlorothiazide (HYDRODIURIL) 25 MG tablet Take 25 mg by mouth daily.   Yes [provider]  HYDROcodone-acetaminophen (NORCO) 10-325 MG tablet Take 1 tablet by mouth every 6 (six) hours as needed.   Yes [provider]  ibuprofen (ADVIL,MOTRIN) 800 MG tablet Take 800 mg by mouth 2 (two) times daily as needed. 03/27/18  Yes [provider]  losartan (COZAAR) 100 MG tablet Take 100 mg by mouth daily.   Yes [provider]  Multiple Vitamin (MULTIVITAMIN WITH MINERALS) TABS tablet Take 1 tablet by mouth daily.   Yes [provider]  ketorolac (TORADOL) 10 MG tablet Take 1 tablet (10 mg total) by mouth every 8 (eight) hours. Patient not taking: Reported on 01/15/2019 04/30/18  Menshew, Dannielle Karvonen, PA-C  orphenadrine (NORFLEX) 100 MG tablet Take 1 tablet (100 mg total) by mouth 2 (two) times daily as needed for muscle spasms. Patient not taking: Reported on 01/15/2019 04/30/18   Menshew, Dannielle Karvonen, PA-C    Physical Exam Vitals: Blood pressure 126/60, pulse (!) 105, height 5\' 1"  (1.549 m), weight 218 lb (98.9 kg). Body mass index is 41.19 kg/m.   General: NAD HEENT: normocephalic, anicteric Thyroid: no enlargement, no palpable nodules Pulmonary: No increased work of breathing, CTAB Cardiovascular: RRR, distal pulses 2+ Breast: Breast symmetrical, no tenderness, no palpable nodules or masses, no skin or nipple retraction present, no nipple discharge.  No axillary or supraclavicular lymphadenopathy. Abdomen: NABS, soft, non-tender, non-distended.  Umbilicus without lesions.  No hepatomegaly, splenomegaly or masses palpable. No evidence of hernia  Genitourinary:  External: Normal external female genitalia.  Normal urethral meatus, normal Bartholin's and Skene's glands.    Vagina: Normal vaginal mucosa, no evidence of prolapse.    Cervix: Grossly normal in appearance, no bleeding  Uterus: Non-enlarged,  mobile, normal contour.  No CMT  Adnexa: ovaries non-enlarged, no adnexal masses  Rectal: deferred  Lymphatic: no evidence of inguinal lymphadenopathy Extremities: no edema, erythema, or tenderness Neurologic: Grossly intact Psychiatric: mood appropriate, affect full  Female chaperone present for pelvic and breast  portions of the physical exam     Assessment: 53 y.o. G0P0000 routine annual exam  Plan: Problem List Items Addressed This Visit    None    Visit Diagnoses    Encounter for gynecological examination without abnormal finding    -  Primary   Breast screening       Acute recurrent frontal sinusitis       Relevant Medications   levofloxacin (LEVAQUIN) 500 MG tablet   Moderate episode of recurrent major depressive disorder (HCC)       Relevant Medications   venlafaxine XR (EFFEXOR XR) 75 MG 24 hr capsule   traZODone (DESYREL) 100 MG tablet      1) Mammogram - recommend yearly screening mammogram.  Mammogram Was ordered today  2) STI screening  was notoffered and therefore not obtained  3) ASCCP guidelines and rational discussed.  Patient opts for every 3 years screening interval  4) Osteoporosis  - per USPTF routine screening DEXA at age 64  5) Routine healthcare maintenance including cholesterol, diabetes screening discussed managed by PCP  6) Colonoscopy up to date 2017  7) Anxiety/Depression - discontinue Cymbalta and switch to Effexor 75mg , add trazodone 100mg  prn insomnia  8) Sinusitis - Rx Levaquin ordered  9) Obesity - patient interested in another trial of phentermine.  BP normotensive today.  Will consider pending response to Effexor  10) Return in about 4 weeks (around 02/12/2019) for medication follow up.    [name], MD [facility] Pigeon, [facility] 01/15/2019, 9:16 AM

## 2019-01-15 NOTE — Telephone Encounter (Signed)
advise

## 2019-01-15 NOTE — Telephone Encounter (Signed)
[facility] calling.  They received eRx for Effexor XR 75mg  quantity 30 BID c 2rf.  They do make a 150mg  tab.  Don't know if pt is tapering up to 150mg   Doesn't know if should be using 150 or if they are doing the 75mg  XR, should they do 60mg  to match the 2 po qd?  Is 150mg  daily correct?  Please clarify.  [phone]

## 2019-01-15 NOTE — Telephone Encounter (Signed)
Pharmacy aware and will switch Rx qty 60, with 2 refills

## 2019-01-15 NOTE — Telephone Encounter (Signed)
Should be once a day 75mg 

## 2019-02-06 ENCOUNTER — Other Ambulatory Visit: Payer: Self-pay

## 2019-02-06 ENCOUNTER — Encounter: Payer: Self-pay | Admitting: Podiatry

## 2019-02-06 ENCOUNTER — Ambulatory Visit (INDEPENDENT_AMBULATORY_CARE_PROVIDER_SITE_OTHER): Payer: BLUE CROSS/BLUE SHIELD | Admitting: Podiatry

## 2019-02-06 ENCOUNTER — Ambulatory Visit (INDEPENDENT_AMBULATORY_CARE_PROVIDER_SITE_OTHER): Payer: BLUE CROSS/BLUE SHIELD

## 2019-02-06 VITALS — Temp 97.7°F

## 2019-02-06 DIAGNOSIS — M779 Enthesopathy, unspecified: Secondary | ICD-10-CM

## 2019-02-06 DIAGNOSIS — M84374A Stress fracture, right foot, initial encounter for fracture: Secondary | ICD-10-CM

## 2019-02-06 DIAGNOSIS — M778 Other enthesopathies, not elsewhere classified: Secondary | ICD-10-CM

## 2019-02-06 DIAGNOSIS — M7752 Other enthesopathy of left foot: Secondary | ICD-10-CM | POA: Diagnosis not present

## 2019-02-06 NOTE — Progress Notes (Signed)
   HPI: 53 year old female presenting today for follow up evaluation of capsulitis of the bilateral midfoot.  Patient states that the injection she received last visit on 10/24/2018 helped for approximately 3 and half months.  Slowly over the last 2 weeks she has noticed an increase in pain and tenderness to her feet. She also has a new complaint of pain and tenderness to the lateral aspect of the right foot secondary to increased work.  Patient works at [employer] and is on her feet several hours per day and they have been working increased hours due to the high demand secondary to the COVID-19 pandemic.  She continues to be managed by chronic pain management and is taking ibuprofen 800 mg as well as Vicodin 10/325 mg.  She presents for further treatment evaluation  Past Medical History:  Diagnosis Date  . Arthritis   . COPD (chronic obstructive pulmonary disease) ([place])   . Fibromyalgia   . Hypertension      Physical Exam: General: The patient is alert and oriented x3 in no acute distress.  Dermatology: Skin is warm, dry and supple bilateral lower extremities. Negative for open lesions or macerations.  Vascular: Palpable pedal pulses bilaterally. No edema or erythema noted. Capillary refill within normal limits.  Neurological: Epicritic and protective threshold grossly intact bilaterally.   Musculoskeletal Exam: Large palpable soft tissue knots noted to the dorsum of the bilateral feet with pain on palpation also noted.  Negative for pain with palpation noted to the left heel along the plantar fascia. Range of motion within normal limits to all pedal and ankle joints bilateral. Muscle strength 5/5 in all groups bilateral.  There is some significant amount of pain on palpation to the dorsal aspect of the bilateral midfoot. Tenderness to palpation also noted along the distal portion of the fifth metatarsal right foot.  Radiographic exam: Normal osseous mineralization.  No fractures identified.   No cortical disruptions.  Joint spaces preserved.  Assessment: 1. Capsulitis dorsal aspect of the bilateral midfoot 2.  Possible stress fracture based on clinical exam fifth metatarsal right foot   Plan of Care:  1. Patient evaluated.  2.  Injection of 0.5 cc Celestone Soluspan injection the bilateral midfoot joint 3.  I do believe the patient has possible stress fracture fifth metatarsal right foot although x-rays are negative.  The patient has an immobilization cam boot at home.  I recommend that she wears it weightbearing as tolerated x3-4 weeks. 4. Continue Motrin 800 mg as per pain management 5. Continue Vicodin 10/325 mg as per pain management 6. Return to clinic in 6 weeks.  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 Edrick Kins, DPM    [address], [place] [address]                Office [phone]  Fax [phone]

## 2019-02-12 ENCOUNTER — Ambulatory Visit: Payer: BLUE CROSS/BLUE SHIELD | Admitting: Obstetrics and Gynecology

## 2019-02-13 DIAGNOSIS — M84374A Stress fracture, right foot, initial encounter for fracture: Secondary | ICD-10-CM | POA: Diagnosis not present

## 2019-03-02 ENCOUNTER — Ambulatory Visit: Payer: BLUE CROSS/BLUE SHIELD | Admitting: Podiatry

## 2019-05-08 ENCOUNTER — Ambulatory Visit (INDEPENDENT_AMBULATORY_CARE_PROVIDER_SITE_OTHER): Payer: BC Managed Care – PPO | Admitting: Podiatry

## 2019-05-08 ENCOUNTER — Encounter: Payer: Self-pay | Admitting: Podiatry

## 2019-05-08 ENCOUNTER — Other Ambulatory Visit: Payer: Self-pay

## 2019-05-08 DIAGNOSIS — M778 Other enthesopathies, not elsewhere classified: Secondary | ICD-10-CM

## 2019-05-08 DIAGNOSIS — M7752 Other enthesopathy of left foot: Secondary | ICD-10-CM | POA: Diagnosis not present

## 2019-05-08 DIAGNOSIS — M19071 Primary osteoarthritis, right ankle and foot: Secondary | ICD-10-CM | POA: Diagnosis not present

## 2019-05-08 DIAGNOSIS — M19079 Primary osteoarthritis, unspecified ankle and foot: Secondary | ICD-10-CM

## 2019-05-08 MED ORDER — METHYLPREDNISOLONE 4 MG PO TBPK: ORAL_TABLET | ORAL | 0 refills | Status: DC

## 2019-05-09 NOTE — Progress Notes (Signed)
   HPI: 53 year old female presenting today for follow up evaluation of capsulitis of the bilateral midfoot and a possible stress fracture of the right foot. She states the right foot began hurting again about one month ago and the left foot started hurting several days ago. She has been taking Vicodin as directed by pain management. Weightbearing and ambulation increases the pain. Patient is here for further evaluation and treatment.   Past Medical History:  Diagnosis Date  . Arthritis   . COPD (chronic obstructive pulmonary disease) ([place])   . Fibromyalgia   . Hypertension      Physical Exam: General: The patient is alert and oriented x3 in no acute distress.  Dermatology: Skin is warm, dry and supple bilateral lower extremities. Negative for open lesions or macerations.  Vascular: Palpable pedal pulses bilaterally. No edema or erythema noted. Capillary refill within normal limits.  Neurological: Epicritic and protective threshold grossly intact bilaterally.   Musculoskeletal Exam: Large palpable soft tissue knots noted to the dorsum of the bilateral feet with pain on palpation also noted.  Negative for pain with palpation noted to the left heel along the plantar fascia. Range of motion within normal limits to all pedal and ankle joints bilateral. Muscle strength 5/5 in all groups bilateral.  There is some significant amount of pain on palpation to the dorsal aspect of the bilateral midfoot.  Assessment: 1. Capsulitis dorsal aspect of the bilateral midfoot 2. Possible stress fracture based on clinical exam fifth metatarsal right foot - resolved    Plan of Care:  1.Patient evaluated.  2. Injection of 0.5 cc Celestone Soluspan injection the bilateral midfoot joint 3. Continue management with pain management.  4. Prescription for Medrol Dose Pak provided to patient. 5. Return to clinic as needed.   *Works at [employer] on [employer] all day      Edrick Kins, DPM [facility]  Dr. Edrick Kins, DPM    [address], [place] [address]                Office [phone]  Fax [phone]

## 2019-07-17 ENCOUNTER — Encounter: Payer: Self-pay | Admitting: Podiatry

## 2019-07-17 ENCOUNTER — Other Ambulatory Visit: Payer: Self-pay

## 2019-07-17 ENCOUNTER — Ambulatory Visit: Payer: BC Managed Care – PPO | Admitting: Podiatry

## 2019-07-17 DIAGNOSIS — M7671 Peroneal tendinitis, right leg: Secondary | ICD-10-CM | POA: Diagnosis not present

## 2019-07-17 DIAGNOSIS — M778 Other enthesopathies, not elsewhere classified: Secondary | ICD-10-CM

## 2019-07-17 DIAGNOSIS — M7751 Other enthesopathy of right foot: Secondary | ICD-10-CM

## 2019-07-17 MED ORDER — MELOXICAM 15 MG PO TABS: 15.0000 mg | ORAL_TABLET | Freq: Every day | ORAL | 1 refills | Status: DC

## 2019-07-17 MED ORDER — METHYLPREDNISOLONE 4 MG PO TBPK: ORAL_TABLET | ORAL | 0 refills | Status: DC

## 2019-07-19 NOTE — Progress Notes (Signed)
   HPI: 53 year old female presenting today for follow up evaluation of bilateral foot pain. She reports continued pain in the feet. She states the injection only provided relief for a couple of days. She notes relief with the Medrol Dose Pak but states the pain returned once it was completed. Being on her feet for long periods of time increases the pain. Patient is here for further evaluation and treatment.   Past Medical History:  Diagnosis Date  . Arthritis   . COPD (chronic obstructive pulmonary disease) ([place])   . Fibromyalgia   . Hypertension      Physical Exam: General: The patient is alert and oriented x3 in no acute distress.  Dermatology: Skin is warm, dry and supple bilateral lower extremities. Negative for open lesions or macerations.  Vascular: Palpable pedal pulses bilaterally. No edema or erythema noted. Capillary refill within normal limits.  Neurological: Epicritic and protective threshold grossly intact bilaterally.   Musculoskeletal Exam: Range of motion within normal limits to all pedal and ankle joints bilateral. Muscle strength 5/5 in all groups bilateral. There is some significant amount of pain on palpation to the dorsal aspect of the bilateral midfoot. Pain with palpation to the insertion of the peroneal tendon of the right foot. Pain with palpation noted to the 1st MPJ of the right foot.   Assessment: 1. Capsulitis dorsal aspect of the bilateral midfoot 2. Insertional peroneal tendinitis right  3. 1st MPJ capsulitis right    Plan of Care:  1.Patient evaluated.  2. Injection of 0.5 cc Celestone Soluspan injection the left midfoot joint. 3. Injection of 0.5 mLs Celestone Soluspan injected into the insertion of the peroneal tendon sheath of the right foot.  4. Injection of 0.5 mLs Celestone Soluspan injected into the 1st MPJ of the right foot.  5. Continue management with pain management.  6. Prescription for Medrol Dose Pak provided to patient. 7.  Prescription for Diclofenac provided to patient. 8. Plantar fascial braces dispensed bilaterally.  9. Return to clinic in 4 weeks.   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 Edrick Kins, DPM    [address], [place] [address]                Office [phone]  Fax [phone]

## 2019-08-14 ENCOUNTER — Other Ambulatory Visit: Payer: Self-pay

## 2019-08-14 ENCOUNTER — Ambulatory Visit: Payer: BC Managed Care – PPO | Admitting: Podiatry

## 2019-08-14 ENCOUNTER — Encounter: Payer: Self-pay | Admitting: Podiatry

## 2019-08-14 DIAGNOSIS — M778 Other enthesopathies, not elsewhere classified: Secondary | ICD-10-CM | POA: Diagnosis not present

## 2019-08-14 MED ORDER — IBUPROFEN 800 MG PO TABS: 800.0000 mg | ORAL_TABLET | Freq: Two times a day (BID) | ORAL | 0 refills | Status: DC | PRN

## 2019-08-14 MED ORDER — CYCLOBENZAPRINE HCL 5 MG PO TABS: 5.0000 mg | ORAL_TABLET | Freq: Every day | ORAL | 1 refills | Status: DC

## 2019-08-17 NOTE — Progress Notes (Signed)
   HPI: 53 y.o. female presenting today for follow up evaluation of bilateral foot pain. She states she is doing well. She reports some continued pain that is worse at night. Being on the feet for long periods of time increases the pain. She tried taking the Diclofenac but states it upset her stomach. Patient is here for further evaluation and treatment.   Past Medical History:  Diagnosis Date  . Arthritis   . COPD (chronic obstructive pulmonary disease) ([place])   . Fibromyalgia   . Hypertension      Physical Exam: General: The patient is alert and oriented x3 in no acute distress.  Dermatology: Skin is warm, dry and supple bilateral lower extremities. Negative for open lesions or macerations.  Vascular: Palpable pedal pulses bilaterally. No edema or erythema noted. Capillary refill within normal limits.  Neurological: Epicritic and protective threshold grossly intact bilaterally.   Musculoskeletal Exam: Pain with palpation noted to the dorsal aspect of the bilateral midfoot. Range of motion within normal limits to all pedal and ankle joints bilateral. Muscle strength 5/5 in all groups bilateral.   Assessment: 1. Capsulitis dorsal aspect bilateral midfoot    Plan of Care:  1. Patient evaluated.  2. Discontinue taking Diclofenac due to GI symptoms.  3. Prescription for Motrin 800 mg provided to patient. Patient known to tolerate.  4. Continue using plantar fascial braces.  5. Prescription for Flexeril 5 mg QHS.  6. Continue management with pain management.  7. Return to clinic as needed.   Works at [employer] on [employer] all day.       Edrick Kins, DPM [facility]  Dr. Edrick Kins, DPM    [address], [place] [address]                Office [phone]  Fax [phone]

## 2019-09-21 ENCOUNTER — Encounter: Payer: Self-pay | Admitting: Podiatry

## 2019-09-21 ENCOUNTER — Other Ambulatory Visit: Payer: Self-pay

## 2019-09-21 ENCOUNTER — Ambulatory Visit: Payer: BC Managed Care – PPO | Admitting: Podiatry

## 2019-09-21 DIAGNOSIS — M7751 Other enthesopathy of right foot: Secondary | ICD-10-CM | POA: Diagnosis not present

## 2019-09-21 DIAGNOSIS — M778 Other enthesopathies, not elsewhere classified: Secondary | ICD-10-CM | POA: Diagnosis not present

## 2019-09-21 DIAGNOSIS — M19079 Primary osteoarthritis, unspecified ankle and foot: Secondary | ICD-10-CM

## 2019-09-25 NOTE — Progress Notes (Signed)
   HPI: 53 y.o. female presenting today for follow up evaluation of bilateral foot pain. She states the left foot pain has improved but she is still experiencing pain in the right foot. She has been taking the Flexeril with no significant relief. She has been using the OTC insoles as well. Being on the feet for long periods of time increases the pain. Patient is here for further evaluation and treatment.   Past Medical History:  Diagnosis Date  . Arthritis   . COPD (chronic obstructive pulmonary disease) ([place])   . Fibromyalgia   . Hypertension      Physical Exam: General: The patient is alert and oriented x3 in no acute distress.  Dermatology: Skin is warm, dry and supple bilateral lower extremities. Negative for open lesions or macerations.  Vascular: Palpable pedal pulses bilaterally. No edema or erythema noted. Capillary refill within normal limits.  Neurological: Epicritic and protective threshold grossly intact bilaterally.   Musculoskeletal Exam: Pain with palpation noted to the dorsal aspect of the bilateral midfoot as well as to the fibular sesamoid apparatus right. Range of motion within normal limits to all pedal and ankle joints bilateral. Muscle strength 5/5 in all groups bilateral.   Assessment: 1. Capsulitis dorsal aspect bilateral midfoot  2. Fibular sesamoiditis right    Plan of Care:  1. Patient evaluated.  2. Injection of 0.5 mLs Celestone Soluspan injected into the fibular sesamoid apparatus of the right foot.  3. Discontinue Flexeril. Does not help.  4. Continue using OTC insoles.  5. Resume using post op shoe.  6. Return to clinic in 4 weeks.   Works at [employer] on [employer] all day.       Edrick Kins, DPM [facility]  Dr. Edrick Kins, DPM    [address], [place] [address]                Office [phone]  Fax [phone]

## 2019-10-18 ENCOUNTER — Other Ambulatory Visit: Payer: Self-pay | Admitting: Orthopedic Surgery

## 2019-10-22 ENCOUNTER — Encounter (HOSPITAL_COMMUNITY): Payer: Self-pay

## 2019-10-22 NOTE — Progress Notes (Signed)
Left a voicemail for Colletta [place] to request orders in epic.

## 2019-10-23 ENCOUNTER — Encounter (HOSPITAL_COMMUNITY)
Admission: RE | Admit: 2019-10-23 | Discharge: 2019-10-23 | Disposition: A | Discharge status: Home / Self Care | Payer: BC Managed Care – PPO | Source: Ambulatory Visit | Attending: Orthopedic Surgery | Admitting: Orthopedic Surgery

## 2019-10-23 ENCOUNTER — Encounter (HOSPITAL_COMMUNITY): Payer: Self-pay

## 2019-10-23 ENCOUNTER — Other Ambulatory Visit (HOSPITAL_COMMUNITY)
Admission: RE | Admit: 2019-10-23 | Discharge: 2019-10-23 | Disposition: A | Discharge status: Home / Self Care | Payer: BC Managed Care – PPO | Source: Ambulatory Visit | Attending: Orthopedic Surgery | Admitting: Orthopedic Surgery

## 2019-10-23 ENCOUNTER — Ambulatory Visit: Payer: BC Managed Care – PPO | Admitting: Podiatry

## 2019-10-23 ENCOUNTER — Other Ambulatory Visit: Payer: Self-pay

## 2019-10-23 DIAGNOSIS — U071 COVID-19: Secondary | ICD-10-CM | POA: Diagnosis not present

## 2019-10-23 DIAGNOSIS — Z01818 Encounter for other preprocedural examination: Secondary | ICD-10-CM | POA: Diagnosis present

## 2019-10-23 HISTORY — DX: COVID-19: U07.1

## 2019-10-23 LAB — CBC
HCT: 41.1 % (ref 36.0–46.0)
Hemoglobin: 13.5 g/dL (ref 12.0–15.0)
MCH: 32.3 pg (ref 26.0–34.0)
MCHC: 32.8 g/dL (ref 30.0–36.0)
MCV: 98.3 fL (ref 80.0–100.0)
Platelets: 404 10*3/uL — ABNORMAL HIGH (ref 150–400)
RBC: 4.18 MIL/uL (ref 3.87–5.11)
RDW: 12.5 % (ref 11.5–15.5)
WBC: 8 10*3/uL (ref 4.0–10.5)
nRBC: 0 % (ref 0.0–0.2)

## 2019-10-23 LAB — BASIC METABOLIC PANEL
Anion gap: 11 (ref 5–15)
BUN: 19 mg/dL (ref 6–20)
CO2: 26 mmol/L (ref 22–32)
Calcium: 9.4 mg/dL (ref 8.9–10.3)
Chloride: 102 mmol/L (ref 98–111)
Creatinine, Ser: 0.85 mg/dL (ref 0.44–1.00)
GFR calc Af Amer: >60 mL/min (ref >60)
GFR calc non Af Amer: >60 mL/min (ref >60)
Glucose, Bld: 111 mg/dL — ABNORMAL HIGH (ref 70–99)
Potassium: 4.3 mmol/L (ref 3.5–5.1)
Sodium: 139 mmol/L (ref 135–145)

## 2019-10-23 NOTE — Patient Instructions (Signed)
DUE TO COVID-19 ONLY ONE VISITOR IS ALLOWED TO COME WITH YOU AND STAY IN THE WAITING ROOM ONLY DURING PRE OP AND PROCEDURE DAY OF SURGERY. THE 1 VISITOR MAY VISIT WITH YOU AFTER SURGERY IN YOUR PRIVATE ROOM DURING VISITING HOURS ONLY!  YOU NEED TO HAVE A COVID 19 TEST ON_12/15/2020______ @_______ , THIS TEST MUST BE DONE BEFORE SURGERY, COME  [place] , [id number].  ([place]) ONCE YOUR COVID TEST IS COMPLETED, PLEASE BEGIN THE QUARANTINE INSTRUCTIONS AS OUTLINED IN YOUR HANDOUT.                [name]    Your procedure is scheduled on: Friday 10/26/2019   Report to [facility] Main  Entrance    Report to admitting at 10:06 AM     Call this number if you have problems the morning of surgery [phone]    Remember: Do not eat food or drink liquids after Midnight.    BRUSH YOUR TEETH MORNING OF SURGERY AND RINSE YOUR MOUTH OUT, NO CHEWING GUM CANDY OR MINTS.     Take these medicines the morning of surgery with A SIP OF WATER: Amlodipine, Duloxetine                                 You may not have any metal on your body including hair pins and              piercings  Do not wear jewelry, make-up, lotions, powders or perfumes, deodorant             Do not wear nail polish on your fingernails.  Do not shave  48 hours prior to surgery.               Do not bring valuables to the hospital. [place].  Contacts, dentures or bridgework may not be worn into surgery.  Leave suitcase in the car. After surgery it may be brought to your room.                  Please read over the following fact sheets you were given: _____________________________________________________________________             [facility] - Preparing for Surgery Before surgery, you can play an important role.  Because skin is not sterile, your skin needs to be as free of germs as possible.  You can reduce the  number of germs on your skin by washing with CHG (chlorahexidine gluconate) soap before surgery.  CHG is an antiseptic cleaner which kills germs and bonds with the skin to continue killing germs even after washing. Please DO NOT use if you have an allergy to CHG or antibacterial soaps.  If your skin becomes reddened/irritated stop using the CHG and inform your nurse when you arrive at Short Stay. Do not shave (including legs and underarms) for at least 48 hours prior to the first CHG shower.  You may shave your face/neck. Please follow these instructions carefully:  1.  Shower with CHG Soap the night before surgery and the  morning of Surgery.  2.  If you choose to wash your hair, wash your hair first as usual with your  normal  shampoo.  3.  After you shampoo, rinse your hair and body  thoroughly to remove the  shampoo.                             4.  Use CHG as you would any other liquid soap.  You can apply chg directly  to the skin and wash                       Gently with a scrungie or clean washcloth.  5.  Apply the CHG Soap to your body ONLY FROM THE NECK DOWN.   Do not use on face/ open                           Wound or open sores. Avoid contact with eyes, ears mouth and genitals (private parts).                       Wash face,  Genitals (private parts) with your normal soap.             6.  Wash thoroughly, paying special attention to the area where your surgery  will be performed.  7.  Thoroughly rinse your body with warm water from the neck down.  8.  DO NOT shower/wash with your normal soap after using and rinsing off  the CHG Soap.                9.  Pat yourself dry with a clean towel.            10.  Wear clean pajamas.            11.  Place clean sheets on your bed the night of your first shower and do not  sleep with pets. Day of Surgery : Do not apply any lotions/deodorants the morning of surgery.  Please wear clean clothes to the hospital/surgery center.  FAILURE TO FOLLOW  THESE INSTRUCTIONS MAY RESULT IN THE CANCELLATION OF YOUR SURGERY PATIENT SIGNATURE_________________________________  NURSE SIGNATURE__________________________________  ________________________________________________________________________

## 2019-10-23 NOTE — Progress Notes (Signed)
PCP - Dr. Sheliah Hatch MD Cardiologist -   Chest x-ray - none EKG - 10/23/2019 Stress Test - none ECHO - none Cardiac Cath - none  Sleep Study - none CPAP -   Fasting Blood Sugar - none Checks Blood Sugar _____ times a day  Blood Thinner Instructions: none Aspirin Instructions: Last Dose:  Anesthesia review:   Patient denies shortness of breath, fever, cough and chest pain at PAT appointment   Patient verbalized understanding of instructions that were given to them at the PAT appointment. Patient was also instructed that they will need to review over the PAT instructions again at home before surgery.

## 2019-10-24 ENCOUNTER — Encounter (HOSPITAL_COMMUNITY): Payer: Self-pay | Admitting: Physician Assistant

## 2019-10-24 LAB — SURGICAL PCR SCREEN
MRSA, PCR: POSITIVE — AB
Staphylococcus aureus: POSITIVE — AB

## 2019-10-24 LAB — NOVEL CORONAVIRUS, NAA (HOSP ORDER, SEND-OUT TO REF LAB; TAT 18-24 HRS): SARS-CoV-2, NAA: DETECTED — AB

## 2019-10-25 ENCOUNTER — Other Ambulatory Visit: Payer: Self-pay | Admitting: Orthopaedic Surgery

## 2019-10-25 ENCOUNTER — Other Ambulatory Visit: Payer: Self-pay | Admitting: Orthopedic Surgery

## 2019-10-25 DIAGNOSIS — M2580 Other specified joint disorders, unspecified joint: Secondary | ICD-10-CM

## 2019-10-25 NOTE — Progress Notes (Signed)
TC to Dr Berenice Primas to notify of positive covid screening result and need for case to be postponed, MD to notify patient.

## 2019-10-26 ENCOUNTER — Ambulatory Visit (HOSPITAL_COMMUNITY)
Admission: RE | Admit: 2019-10-26 | Payer: BLUE CROSS/BLUE SHIELD | Source: Home / Self Care | Admitting: Orthopedic Surgery

## 2019-10-26 ENCOUNTER — Telehealth: Payer: Self-pay | Admitting: Nurse Practitioner

## 2019-10-26 NOTE — Telephone Encounter (Signed)
Called to Discuss with patient about Covid symptoms and the use of bamlanivimab, a monoclonal antibody infusion for those with mild to moderate Covid symptoms and at a high risk of hospitalization.     Pt is qualified for this infusion at the [facility] infusion center due to co-morbid conditions and/or a member of an at-risk group.     Unable to reach pt  

## 2019-11-16 ENCOUNTER — Other Ambulatory Visit: Payer: Self-pay | Admitting: Orthopedic Surgery

## 2019-11-16 ENCOUNTER — Encounter (HOSPITAL_COMMUNITY): Payer: Self-pay

## 2019-11-16 NOTE — Patient Instructions (Addendum)
DUE TO COVID-19 ONLY ONE VISITOR IS ALLOWED TO COME WITH YOU AND STAY IN THE WAITING ROOM ONLY DURING PRE OP AND PROCEDURE. THE ONE VISITOR MAY VISIT WITH YOU IN YOUR PRIVATE ROOM DURING VISITING HOURS ONLY!!       Your procedure is scheduled on: Friday, Jan. 15, 2021   Report to [facility] Main  Entrance    Report to admitting at 9:25 AM   Call this number if you have problems the morning of surgery [phone]   Do not eat food:After Midnight.   May have liquids until    Brush your teeth the morning of surgery.   Do NOT smoke after Midnight   Take these medicines the morning of surgery with A SIP OF WATER: Amlodipine, Duloxetine   May use Flonase the day of surgery                               You may not have any metal on your body including hair pins, jewelry, and body piercings             Do not wear make-up, lotions, powders, perfumes/cologne, or deodorant             Do not wear nail polish.  Do not shave  48 hours prior to surgery.                Do not bring valuables to the hospital. [place].   Contacts, dentures or bridgework may not be worn into surgery.    Patients discharged the day of surgery will not be allowed to drive home.   Special Instructions: Bring a copy of your healthcare power of attorney and living will documents         the day of surgery if you haven't scanned them in before.              Please read over the following fact sheets you were given:  [facility] - Preparing for Surgery Before surgery, you can play an important role.  Because skin is not sterile, your skin needs to be as free of germs as possible.  You can reduce the number of germs on your skin by washing with CHG (chlorahexidine gluconate) soap before surgery.  CHG is an antiseptic cleaner which kills germs and bonds with the skin to continue killing germs even after washing. Please DO NOT use if you have an allergy to  CHG or antibacterial soaps.  If your skin becomes reddened/irritated stop using the CHG and inform your nurse when you arrive at Short Stay. Do not shave (including legs and underarms) for at least 48 hours prior to the first CHG shower.  You may shave your face/neck.  Please follow these instructions carefully:  1.  Shower with CHG Soap the night before surgery and the  morning of surgery.  2.  If you choose to wash your hair, wash your hair first as usual with your normal  shampoo.  3.  After you shampoo, rinse your hair and body thoroughly to remove the shampoo.                             4.  Use CHG as you would any other liquid soap.  You can  apply chg directly to the skin and wash.  Gently with a scrungie or clean washcloth.  5.  Apply the CHG Soap to your body ONLY FROM THE NECK DOWN.   Do   not use on face/ open                           Wound or open sores. Avoid contact with eyes, ears mouth and   genitals (private parts).                       Wash face,  Genitals (private parts) with your normal soap.             6.  Wash thoroughly, paying special attention to the area where your    surgery  will be performed.  7.  Thoroughly rinse your body with warm water from the neck down.  8.  DO NOT shower/wash with your normal soap after using and rinsing off the CHG Soap.                9.  Pat yourself dry with a clean towel.            10.  Wear clean pajamas.            11.  Place clean sheets on your bed the night of your first shower and do not  sleep with pets. Day of Surgery : Do not apply any lotions/deodorants the morning of surgery.  Please wear clean clothes to the hospital/surgery center.  FAILURE TO FOLLOW THESE INSTRUCTIONS MAY RESULT IN THE CANCELLATION OF YOUR SURGERY  PATIENT SIGNATURE_________________________________  NURSE SIGNATURE__________________________________  ________________________________________________________________________

## 2019-11-16 NOTE — Progress Notes (Signed)
Left a voicemail for Ignatius Specking at Dr. Berenice Primas' office to request pre op orders be placed in epic.

## 2019-11-20 ENCOUNTER — Encounter (HOSPITAL_COMMUNITY): Payer: Self-pay

## 2019-11-20 ENCOUNTER — Encounter (HOSPITAL_COMMUNITY)
Admission: RE | Admit: 2019-11-20 | Discharge: 2019-11-20 | Disposition: A | Discharge status: Home / Self Care | Payer: BC Managed Care – PPO | Source: Ambulatory Visit | Attending: Orthopedic Surgery | Admitting: Orthopedic Surgery

## 2019-11-20 ENCOUNTER — Other Ambulatory Visit: Payer: Self-pay

## 2019-11-20 ENCOUNTER — Other Ambulatory Visit: Payer: Self-pay | Admitting: Orthopedic Surgery

## 2019-11-20 DIAGNOSIS — Z01812 Encounter for preprocedural laboratory examination: Secondary | ICD-10-CM | POA: Diagnosis not present

## 2019-11-20 LAB — COMPREHENSIVE METABOLIC PANEL WITH GFR
ALT: 18 U/L (ref 0–44)
AST: 17 U/L (ref 15–41)
Albumin: 4.3 g/dL (ref 3.5–5.0)
Alkaline Phosphatase: 88 U/L (ref 38–126)
Anion gap: 11 (ref 5–15)
BUN: 25 mg/dL — ABNORMAL HIGH (ref 6–20)
CO2: 26 mmol/L (ref 22–32)
Calcium: 9.3 mg/dL (ref 8.9–10.3)
Chloride: 100 mmol/L (ref 98–111)
Creatinine, Ser: 0.51 mg/dL (ref 0.44–1.00)
GFR calc Af Amer: >60 mL/min (ref >60)
GFR calc non Af Amer: >60 mL/min (ref >60)
Glucose, Bld: 100 mg/dL — ABNORMAL HIGH (ref 70–99)
Potassium: 4.3 mmol/L (ref 3.5–5.1)
Sodium: 137 mmol/L (ref 135–145)
Total Bilirubin: 0.6 mg/dL (ref 0.3–1.2)
Total Protein: 7.9 g/dL (ref 6.5–8.1)

## 2019-11-20 LAB — CBC WITH DIFFERENTIAL/PLATELET
Abs Immature Granulocytes: 0.04 10*3/uL (ref 0.00–0.07)
Basophils Absolute: 0.1 10*3/uL (ref 0.0–0.1)
Basophils Relative: 0 %
Eosinophils Absolute: 0.2 10*3/uL (ref 0.0–0.5)
Eosinophils Relative: 2 %
HCT: 43.7 % (ref 36.0–46.0)
Hemoglobin: 14.4 g/dL (ref 12.0–15.0)
Immature Granulocytes: 0 %
Lymphocytes Relative: 22 %
Lymphs Abs: 2.5 10*3/uL (ref 0.7–4.0)
MCH: 32.1 pg (ref 26.0–34.0)
MCHC: 33 g/dL (ref 30.0–36.0)
MCV: 97.5 fL (ref 80.0–100.0)
Monocytes Absolute: 1 10*3/uL (ref 0.1–1.0)
Monocytes Relative: 9 %
Neutro Abs: 7.4 10*3/uL (ref 1.7–7.7)
Neutrophils Relative %: 67 %
Platelets: 452 10*3/uL — ABNORMAL HIGH (ref 150–400)
RBC: 4.48 MIL/uL (ref 3.87–5.11)
RDW: 12.4 % (ref 11.5–15.5)
WBC: 11.2 10*3/uL — ABNORMAL HIGH (ref 4.0–10.5)
nRBC: 0 % (ref 0.0–0.2)

## 2019-11-20 LAB — URINALYSIS, ROUTINE W REFLEX MICROSCOPIC
Bilirubin Urine: NEGATIVE
Glucose, UA: NEGATIVE mg/dL
Hgb urine dipstick: NEGATIVE
Ketones, ur: 5 mg/dL — AB
Leukocytes,Ua: NEGATIVE
Nitrite: NEGATIVE
Protein, ur: NEGATIVE mg/dL
Specific Gravity, Urine: 1.025 (ref 1.005–1.030)
pH: 6 (ref 5.0–8.0)

## 2019-11-20 LAB — PROTIME-INR
INR: 1.1 (ref 0.8–1.2)
Prothrombin Time: 13.6 seconds (ref 11.4–15.2)

## 2019-11-20 LAB — APTT: aPTT: 30 s (ref 24–36)

## 2019-11-20 NOTE — Progress Notes (Signed)
PCP - Dr.Neimeyer Cardiologist -N/A   Chest x-ray - N/A EKG - 10/23/2019 in epic Stress Test - N/A ECHO - N/A Cardiac Cath - N/A  Sleep Study - Yes CPAP - No OSA  Fasting Blood Sugar - N/A Checks Blood Sugar _N/A____ times a day  Blood Thinner Instructions:  N/A Aspirin Instructions:N/A Last Dose:N/A  Anesthesia review: COPD, HTN  Patient denies shortness of breath, fever, cough and chest pain at PAT appointment   Patient verbalized understanding of instructions that were given to them at the PAT appointment. Patient was also instructed that they will need to review over the PAT instructions again at home before surgery.

## 2019-11-21 LAB — SURGICAL PCR SCREEN
MRSA, PCR: POSITIVE — AB
Staphylococcus aureus: POSITIVE — AB

## 2019-11-21 LAB — ABO/RH: ABO/RH(D): O POS

## 2019-11-22 MED ORDER — BUPIVACAINE LIPOSOME 1.3 % IJ SUSP
20.0000 mL | Freq: Once | INTRAMUSCULAR | Status: DC
  Filled 2019-11-22: qty 20

## 2019-11-22 NOTE — H&P (Signed)
TOTAL KNEE ADMISSION H&P  Patient is being admitted for right total knee arthroplasty.  Subjective:  Chief Complaint:right knee pain.  HPI: [name], 54 y.o. female, has a history of pain and functional disability in the right knee due to arthritis and has failed non-surgical conservative treatments for greater than 12 weeks to includeNSAID's and/or analgesics, corticosteriod injections, viscosupplementation injections, weight reduction as appropriate and activity modification.  Onset of symptoms was gradual, starting 5 years ago with gradually worsening course since that time. The patient noted no past surgery on the right knee(s).  Patient currently rates pain in the right knee(s) at 8 out of 10 with activity. Patient has night pain, worsening of pain with activity and weight bearing, pain that interferes with activities of daily living, crepitus and joint swelling.  Patient has evidence of subchondral sclerosis and joint space narrowing by imaging studies. This patient has had Failure of all reasonable conservative care. There is no active infection.  Patient Active Problem List   Diagnosis Date Noted  . Benign neoplasm of cecum   . Special screening for malignant neoplasms, colon   . Primary osteoarthritis of left knee 05/17/2016  . Tendinitis of right rotator cuff 05/17/2016  . Osteoarthritis of left shoulder 05/09/2015  . TOBACCO ABUSE 06/20/2008  . CONSTIPATION 06/18/2008  . HEART MURMUR, BENIGN 06/18/2008  . OTHER RESPIRATORY COMPLICATIONS [id number]   Past Medical History:  Diagnosis Date  . Arthritis   . COPD (chronic obstructive pulmonary disease) ([place])   . COVID-19 10/23/2019   Asymptomatic  . Fibromyalgia   . History of colon polyps 2017  . Hypertension     Past Surgical History:  Procedure Laterality Date  . COLONOSCOPY WITH PROPOFOL N/A 09/03/2016   Procedure: COLONOSCOPY WITH PROPOFOL;  Surgeon: [name], MD;  Location: [facility];  Service: Endoscopy;   Laterality: N/A;  . FRACTURE SURGERY     left foot   . KNEE CARTILAGE SURGERY  2015   left  . LAPAROSCOPIC SALPINGOOPHERECTOMY    . left hand     1 for ganglion cyst and next yr replace "joints" in left hand  . PLANTAR FASCIA SURGERY     left foot  . SHOULDER INJECTION Right 05/17/2016   Procedure: SHOULDER INJECTION;  Surgeon: Dorna Leitz, MD;  Location: [place];  Service: Orthopedics;  Laterality: Right;  . SHOULDER SURGERY     x 2 on right shoulder  . TONSILLECTOMY    . TOTAL KNEE ARTHROPLASTY Left 05/17/2016   Procedure: TOTAL KNEE ARTHROPLASTY;  Surgeon: Dorna Leitz, MD;  Location: [place];  Service: Orthopedics;  Laterality: Left;  . TOTAL SHOULDER ARTHROPLASTY Left 05/09/2015   Procedure: TOTAL SHOULDER ARTHROPLASTY;  Surgeon: Dorna Leitz, MD;  Location: [place];  Service: Orthopedics;  Laterality: Left;  . TUBAL LIGATION     tubes removed stated patient    Current Facility-Administered Medications  Medication Dose Route Frequency Provider Last Rate Last Admin  . [START ON 11/23/2019] bupivacaine liposome (EXPAREL) 1.3 % injection 266 mg  20 mL Other Once Lenis Noon, [facility]       Current Outpatient Medications  Medication Sig Dispense Refill Last Dose  . amLODipine (NORVASC) 10 MG tablet Take 10 mg by mouth daily.     Marland Kitchen b complex vitamins tablet Take 1 tablet by mouth daily.     . cholecalciferol (VITAMIN D) 1000 units tablet Take 1,000 Units by mouth daily.      . cyclobenzaprine (FLEXERIL) 5 MG tablet Take 1  tablet (5 mg total) by mouth at bedtime. (Patient taking differently: Take 5 mg by mouth daily as needed for muscle spasms. ) 30 tablet 1   . DULoxetine (CYMBALTA) 60 MG capsule Take 60 mg by mouth daily.     . fluticasone (FLONASE) 50 MCG/ACT nasal spray Place 1 spray into both nostrils daily.     . hydrochlorothiazide (HYDRODIURIL) 25 MG tablet Take 25 mg by mouth daily.     Marland Kitchen HYDROcodone-acetaminophen (NORCO) 10-325 MG tablet Take 1 tablet by mouth 5 (five) times daily as  needed for pain.     Marland Kitchen ibuprofen (ADVIL) 800 MG tablet Take 1 tablet (800 mg total) by mouth 2 (two) times daily as needed. (Patient taking differently: Take 800 mg by mouth 2 (two) times daily as needed for moderate pain. ) 30 tablet 0   . losartan (COZAAR) 100 MG tablet Take 100 mg by mouth daily.     . Multiple Vitamin (MULTIVITAMIN WITH MINERALS) TABS tablet Take 1 tablet by mouth daily. 50 +     . meloxicam (MOBIC) 15 MG tablet Take 1 tablet (15 mg total) by mouth daily. (Patient not taking: Reported on 10/19/2019) 30 tablet 1   . methylPREDNISolone (MEDROL DOSEPAK) 4 MG TBPK tablet 6 day dose pack - take as directed (Patient not taking: Reported on 10/19/2019) 21 tablet 0   . traZODone (DESYREL) 100 MG tablet Take 1 tablet (100 mg total) by mouth at bedtime as needed for sleep. (Patient not taking: Reported on 10/19/2019) 30 tablet 2   . venlafaxine XR (EFFEXOR XR) 75 MG 24 hr capsule Take 1 capsule (75 mg total) by mouth daily. (Patient not taking: Reported on 10/19/2019) 30 capsule 2    Allergies  Allergen Reactions  . Augmentin [Amoxicillin-Pot Clavulanate] Anaphylaxis    Did it involve swelling of the face/tongue/throat, SOB, or low BP? Yes Did it involve sudden or severe rash/hives, skin peeling, or any reaction on the inside of your mouth or nose? No Did you need to seek medical attention at a hospital or doctor's office? Yes When did it last happen?15 years ago If all above answers are "NO", may proceed with cephalosporin use.   . Bee Venom Hives and Shortness Of Breath    Treats with benadryl  . Diclofenac Sodium Itching and Swelling    Swelling of eyes and face PENNSAID- topical gel  . Other Hives and Shortness Of Breath    Reaction to spider bites (treats with benadryl)  . Sulfa Antibiotics Anaphylaxis and Other (See Comments)    Childhood allergic reaction Sulfonamides    Social History   Tobacco Use  . Smoking status: Current Some Day Smoker    Packs/day: 0.50     Years: 30.00    Pack years: 15.00    Types: Cigarettes  . Smokeless tobacco: Never Used  Substance Use Topics  . Alcohol use: Yes    Comment: monthly    No family history on file.   Review of Systems ROS: I have reviewed the patient's review of systems thoroughly and there are no positive responses as relates to the HPI. Objective:  Physical Exam  Vital signs in last 24 hours:   Well-developed well-nourished patient in no acute distress. Alert and oriented x3 HEENT:within normal limits Cardiac: Regular rate and rhythm Pulmonary: Lungs clear to auscultation Abdomen: Soft and nontender.  Normal active bowel sounds  Musculoskeletal: Right knee: Painful range of motion.  Limited range of motion.  No instability.  Trace effusion.  Mild varus malalignment.  Neurovascular intact distally. Labs: Recent Results (from the past 2160 hour(s))  Novel Coronavirus, NAA (Hosp order, Send-out to Ref Lab; TAT 18-24 hrs     Status: Abnormal   Collection Time: 10/23/19  1:07 PM   Specimen: Nasopharyngeal Swab; Respiratory  Result Value Ref Range   SARS-CoV-2, NAA DETECTED (A) NOT DETECTED    Comment: (NOTE)                  Client Requested Flag Testing was performed using the cobas(R) SARS-CoV-2 test. This nucleic acid amplification test was developed and its performance characteristics determined by [employer]. Nucleic acid amplification tests include PCR and TMA. This test has not been FDA cleared or approved. This test has been authorized by FDA under an Emergency Use Authorization (EUA). This test is only authorized for the duration of time the declaration that circumstances exist justifying the authorization of the emergency use of in vitro diagnostic tests for detection of SARS-CoV-2 virus and/or diagnosis of COVID-19 infection under section 564(b)(1) of the Act, 21 U.S.C. [id number]) (1), unless the authorization is terminated or revoked sooner. When diagnostic  testing is negative, the possibility of a false negative result should be considered in the context of a patient's recent exposures and the presence of clinical signs and symptoms consisten t with COVID-19. An individual without symptoms of COVID- 19 and who is not shedding SARS-CoV-2 virus would expect to have a negative (not detected) result in this assay. Performed At: [facility] [place], [place] [id number] Rush Farmer MD [id number]    Coronavirus Source NASOPHARYNGEAL     Comment: Performed at [facility], [place] [address], [place], [place] [address]  Surgical pcr screen     Status: Abnormal   Collection Time: 10/23/19  1:53 PM   Specimen: Nasal Mucosa; Nasal Swab  Result Value Ref Range   MRSA, PCR POSITIVE (A) NEGATIVE   Staphylococcus aureus POSITIVE (A) NEGATIVE    Comment: RESULT CALLED TO, READ BACK BY AND VERIFIED WITH: EGGLE[place], RN ON 10/24/19 @ 0816 BY LE (NOTE) The Xpert SA Assay (FDA approved for NASAL specimens in patients 47 years of age and older), is one component of a comprehensive surveillance program. It is not intended to diagnose infection nor to guide or monitor treatment. Performed at [facility], [place] [address] [place], [place] [address]   CBC     Status: Abnormal   Collection Time: 10/23/19  2:19 PM  Result Value Ref Range   WBC 8.0 4.0 - 10.5 K/uL   RBC 4.18 3.87 - 5.11 MIL/uL   Hemoglobin 13.5 12.0 - 15.0 g/dL   HCT 41.1 36.0 - 46.0 %   MCV 98.3 80.0 - 100.0 fL   MCH 32.3 26.0 - 34.0 pg   MCHC 32.8 30.0 - 36.0 g/dL   RDW 12.5 11.5 - 15.5 %   Platelets 404 (H) 150 - 400 K/uL   nRBC 0.0 0.0 - 0.2 %    Comment: Performed at [facility], [place] [address], [place],  [id number]  Basic metabolic panel     Status: Abnormal   Collection Time: 10/23/19  2:19 PM  Result Value Ref Range   Sodium 139 135 - 145 mmol/L   Potassium 4.3 3.5 - 5.1 mmol/L   Chloride 102 98 - 111  mmol/L   CO2 26 22 - 32 mmol/L   Glucose, Bld 111 (H) 70 - 99 mg/dL  BUN 19 6 - 20 mg/dL   Creatinine, Ser 0.85 0.44 - 1.00 mg/dL   Calcium 9.4 8.9 - 10.3 mg/dL   GFR calc non Af Amer >60 >60 mL/min   GFR calc Af Amer >60 >60 mL/min   Anion gap 11 5 - 15    Comment: Performed at [facility], [place] [address], [place], [place] [address]  Surgical pcr screen     Status: Abnormal   Collection Time: 11/20/19  1:36 PM   Specimen: Nasal Mucosa; Nasal Swab  Result Value Ref Range   MRSA, PCR POSITIVE (A) NEGATIVE    Comment: RESULT CALLED TO, READ BACK BY AND VERIFIED WITH: [place], I. @ [id number] 11/21/2019 PERRY, J.    Staphylococcus aureus POSITIVE (A) NEGATIVE    Comment: RESULT CALLED TO, READ BACK BY AND VERIFIED WITH: EWURUN,I. @ 0814 11/21/2019 PERRY, J. (NOTE) The Xpert SA Assay (FDA approved for NASAL specimens in patients 28 years of age and older), is one component of a comprehensive surveillance program. It is not intended to diagnose infection nor to guide or monitor treatment. Performed at [facility], [place] [address] [place], [place] [address]   APTT     Status: None   Collection Time: 11/20/19  1:40 PM  Result Value Ref Range   aPTT 30 24 - 36 seconds    Comment: Performed at [facility], [place] [address], [place], [place] [address]  CBC WITH DIFFERENTIAL     Status: Abnormal   Collection Time: 11/20/19  1:40 PM  Result Value Ref Range   WBC 11.2 (H) 4.0 - 10.5 K/uL   RBC 4.48 3.87 - 5.11 MIL/uL   Hemoglobin 14.4 12.0 - 15.0 g/dL   HCT 43.7 36.0 - 46.0 %   MCV 97.5 80.0 - 100.0 fL   MCH 32.1 26.0 - 34.0 pg   MCHC 33.0 30.0 - 36.0 g/dL   RDW 12.4 11.5 - 15.5 %   Platelets 452 (H) 150 - 400 K/uL   nRBC 0.0 0.0 - 0.2 %   Neutrophils Relative % 67 %   Neutro Abs 7.4 1.7 - 7.7 K/uL   Lymphocytes Relative 22 %   Lymphs Abs 2.5 0.7 - 4.0 K/uL   Monocytes Relative 9 %   Monocytes Absolute 1.0 0.1 - 1.0 K/uL    Eosinophils Relative 2 %   Eosinophils Absolute 0.2 0.0 - 0.5 K/uL   Basophils Relative 0 %   Basophils Absolute 0.1 0.0 - 0.1 K/uL   Immature Granulocytes 0 %   Abs Immature Granulocytes 0.04 0.00 - 0.07 K/uL    Comment: Performed at [facility], [place] [address], [place], [place] [address]  Comprehensive metabolic panel     Status: Abnormal   Collection Time: 11/20/19  1:40 PM  Result Value Ref Range   Sodium 137 135 - 145 mmol/L   Potassium 4.3 3.5 - 5.1 mmol/L   Chloride 100 98 - 111 mmol/L   CO2 26 22 - 32 mmol/L   Glucose, Bld 100 (H) 70 - 99 mg/dL   BUN 25 (H) 6 - 20 mg/dL   Creatinine, Ser 0.51 0.44 - 1.00 mg/dL   Calcium 9.3 8.9 - 10.3 mg/dL   Total Protein 7.9 6.5 - 8.1 g/dL   Albumin 4.3 3.5 - 5.0 g/dL   AST 17 15 - 41 U/L   ALT 18 0 - 44 U/L   Alkaline Phosphatase 88 38 - 126 U/L  Total Bilirubin 0.6 0.3 - 1.2 mg/dL   GFR calc non Af Amer >60 >60 mL/min   GFR calc Af Amer >60 >60 mL/min   Anion gap 11 5 - 15    Comment: Performed at [facility], [place] [address] [place], [place] [address]  Protime-INR     Status: None   Collection Time: 11/20/19  1:40 PM  Result Value Ref Range   Prothrombin Time 13.6 11.4 - 15.2 seconds   INR 1.1 0.8 - 1.2    Comment: (NOTE) INR goal varies based on device and disease states. Performed at [facility], [place] [address], [place], [place] [address]   Urinalysis, Routine w reflex microscopic     Status: Abnormal   Collection Time: 11/20/19  1:40 PM  Result Value Ref Range   Color, Urine AMBER (A) YELLOW    Comment: BIOCHEMICALS MAY BE AFFECTED BY COLOR   APPearance HAZY (A) CLEAR   Specific Gravity, Urine 1.025 1.005 - 1.030   pH 6.0 5.0 - 8.0   Glucose, UA NEGATIVE NEGATIVE mg/dL   Hgb urine dipstick NEGATIVE NEGATIVE   Bilirubin Urine NEGATIVE NEGATIVE   Ketones, ur 5 (A) NEGATIVE mg/dL   Protein, ur NEGATIVE NEGATIVE mg/dL   Nitrite NEGATIVE NEGATIVE    Leukocytes,Ua NEGATIVE NEGATIVE    Comment: Performed at [facility] [address] [place], [place] [address]  Type and screen Order type and screen if day of surgery is less than 15 days from draw of preadmission visit or order morning of surgery if day of surgery is greater than 6 days from preadmission visit.     Status: None   Collection Time: 11/20/19  1:48 PM  Result Value Ref Range   ABO/RH(D) O POS    Antibody Screen NEG    Sample Expiration 12/04/2019,2359    Extend sample reason      NO TRANSFUSIONS OR PREGNANCY IN THE PAST 3 MONTHS Performed at [facility], [place] [address] [place], [place] [address]   ABO/Rh     Status: None   Collection Time: 11/20/19  1:48 PM  Result Value Ref Range   ABO/RH(D)      O POS Performed at [facility], [place] [address] [place], [place] [address]     Estimated body mass index is 40.81 kg/m as calculated from the following:   Height as of 11/20/19: 5\' 1"  (1.549 m).   Weight as of 11/20/19: 98 kg.   Imaging Review Plain radiographs demonstrate severe degenerative joint disease of the right knee(s). The overall alignment ismild varus. The bone quality appears to be fair for age and reported activity level.      Assessment/Plan:  End stage arthritis, right knee   The patient history, physical examination, clinical judgment of the provider and imaging studies are consistent with end stage degenerative joint disease of the right knee(s) and total knee arthroplasty is deemed medically necessary. The treatment options including medical management, injection therapy arthroscopy and arthroplasty were discussed at length. The risks and benefits of total knee arthroplasty were presented and reviewed. The risks due to aseptic loosening, infection, stiffness, patella tracking problems, thromboembolic complications and other imponderables were discussed. The patient acknowledged the  explanation, agreed to proceed with the plan and consent was signed. Patient is being admitted for inpatient treatment for surgery, pain control, PT, OT, prophylactic antibiotics, VTE prophylaxis, progressive ambulation and ADL's and discharge planning. The patient is planning to  be discharged home with home health services     Patient's anticipated LOS is less than 2 midnights, meeting these requirements: - Younger than 26 - Lives within 1 hour of care - Has a competent adult at home to recover with post-op recover - NO history of  - Chronic pain requiring opiods  - Diabetes  - Coronary Artery Disease  - Heart failure  - Heart attack  - Stroke  - DVT/VTE  - Cardiac arrhythmia  - Respiratory Failure/COPD  - Renal failure  - Anemia  - Advanced Liver disease

## 2019-11-23 ENCOUNTER — Encounter (HOSPITAL_COMMUNITY): Admission: RE | Payer: Self-pay | Source: Home / Self Care

## 2019-11-23 ENCOUNTER — Ambulatory Visit (HOSPITAL_COMMUNITY): Payer: BC Managed Care – PPO | Admitting: Physician Assistant

## 2019-11-23 ENCOUNTER — Encounter (HOSPITAL_COMMUNITY): Payer: Self-pay | Admitting: Orthopedic Surgery

## 2019-11-23 ENCOUNTER — Ambulatory Visit (HOSPITAL_COMMUNITY)
Admission: RE | Admit: 2019-11-23 | Discharge: 2019-11-23 | Disposition: A | Discharge status: Home / Self Care | Payer: BC Managed Care – PPO | Attending: Orthopedic Surgery | Admitting: Orthopedic Surgery

## 2019-11-23 ENCOUNTER — Encounter (HOSPITAL_COMMUNITY)
Admission: RE | Disposition: A | Discharge status: Home / Self Care | Payer: Self-pay | Source: Home / Self Care | Attending: Orthopedic Surgery

## 2019-11-23 DIAGNOSIS — M1711 Unilateral primary osteoarthritis, right knee: Secondary | ICD-10-CM | POA: Diagnosis present

## 2019-11-23 DIAGNOSIS — Z6841 Body Mass Index (BMI) 40.0 and over, adult: Secondary | ICD-10-CM | POA: Insufficient documentation

## 2019-11-23 DIAGNOSIS — Z79899 Other long term (current) drug therapy: Secondary | ICD-10-CM | POA: Diagnosis not present

## 2019-11-23 DIAGNOSIS — S83141A Lateral subluxation of proximal end of tibia, right knee, initial encounter: Secondary | ICD-10-CM | POA: Insufficient documentation

## 2019-11-23 DIAGNOSIS — Z96612 Presence of left artificial shoulder joint: Secondary | ICD-10-CM | POA: Insufficient documentation

## 2019-11-23 DIAGNOSIS — F17210 Nicotine dependence, cigarettes, uncomplicated: Secondary | ICD-10-CM | POA: Diagnosis not present

## 2019-11-23 DIAGNOSIS — Z8616 Personal history of covid-19: Secondary | ICD-10-CM | POA: Diagnosis not present

## 2019-11-23 DIAGNOSIS — I10 Essential (primary) hypertension: Secondary | ICD-10-CM | POA: Insufficient documentation

## 2019-11-23 DIAGNOSIS — M797 Fibromyalgia: Secondary | ICD-10-CM | POA: Diagnosis not present

## 2019-11-23 DIAGNOSIS — Z96652 Presence of left artificial knee joint: Secondary | ICD-10-CM | POA: Diagnosis not present

## 2019-11-23 DIAGNOSIS — X58XXXA Exposure to other specified factors, initial encounter: Secondary | ICD-10-CM | POA: Diagnosis not present

## 2019-11-23 DIAGNOSIS — J449 Chronic obstructive pulmonary disease, unspecified: Secondary | ICD-10-CM | POA: Diagnosis not present

## 2019-11-23 HISTORY — PX: TOTAL KNEE ARTHROPLASTY: SHX125

## 2019-11-23 LAB — TYPE AND SCREEN
ABO/RH(D): O POS
Antibody Screen: NEGATIVE

## 2019-11-23 SURGERY — ARTHROPLASTY, KNEE, TOTAL
Anesthesia: Spinal | Site: Knee | Laterality: Right

## 2019-11-23 MED ORDER — FENTANYL CITRATE (PF) 100 MCG/2ML IJ SOLN
50.0000 ug | Freq: Once | INTRAMUSCULAR | Status: AC
  Administered 2019-11-23: 25 ug via INTRAVENOUS
  Filled 2019-11-23: qty 2

## 2019-11-23 MED ORDER — DEXAMETHASONE SODIUM PHOSPHATE 10 MG/ML IJ SOLN
INTRAMUSCULAR | Status: AC
  Filled 2019-11-23: qty 1

## 2019-11-23 MED ORDER — ONDANSETRON HCL 4 MG/2ML IJ SOLN
INTRAMUSCULAR | Status: DC | PRN
  Administered 2019-11-23: 4 mg via INTRAVENOUS

## 2019-11-23 MED ORDER — BUPIVACAINE HCL (PF) 0.5 % IJ SOLN
INTRAMUSCULAR | Status: AC
  Filled 2019-11-23: qty 30

## 2019-11-23 MED ORDER — BUPIVACAINE HCL (PF) 0.5 % IJ SOLN
INTRAMUSCULAR | Status: DC | PRN
  Administered 2019-11-23: 30 mL

## 2019-11-23 MED ORDER — METHOCARBAMOL 750 MG PO TABS: 750.0000 mg | ORAL_TABLET | Freq: Three times a day (TID) | ORAL | 0 refills | Status: DC | PRN

## 2019-11-23 MED ORDER — METHOCARBAMOL 500 MG IVPB - SIMPLE MED
500.0000 mg | Freq: Four times a day (QID) | INTRAVENOUS | Status: DC | PRN
  Administered 2019-11-23: 500 mg via INTRAVENOUS

## 2019-11-23 MED ORDER — LIDOCAINE HCL (CARDIAC) PF 100 MG/5ML IV SOSY
PREFILLED_SYRINGE | INTRAVENOUS | Status: DC | PRN
  Administered 2019-11-23: 50 mg via INTRAVENOUS

## 2019-11-23 MED ORDER — METHOCARBAMOL 500 MG IVPB - SIMPLE MED
INTRAVENOUS | Status: AC
  Filled 2019-11-23: qty 50

## 2019-11-23 MED ORDER — TRANEXAMIC ACID-NACL 1000-0.7 MG/100ML-% IV SOLN
1000.0000 mg | INTRAVENOUS | Status: DC
  Administered 2019-11-23: 1000 mg via INTRAVENOUS

## 2019-11-23 MED ORDER — OXYCODONE HCL 5 MG PO TABS
ORAL_TABLET | ORAL | Status: AC
  Filled 2019-11-23: qty 2

## 2019-11-23 MED ORDER — PHENYLEPHRINE HCL (PRESSORS) 10 MG/ML IV SOLN
INTRAVENOUS | Status: AC
  Filled 2019-11-23: qty 1

## 2019-11-23 MED ORDER — OXYCODONE HCL 5 MG PO TABS
5.0000 mg | ORAL_TABLET | ORAL | Status: DC | PRN
  Administered 2019-11-23: 10 mg via ORAL

## 2019-11-23 MED ORDER — SODIUM CHLORIDE (PF) 0.9 % IJ SOLN
INTRAMUSCULAR | Status: AC
  Filled 2019-11-23: qty 50

## 2019-11-23 MED ORDER — POVIDONE-IODINE 10 % EX SWAB
2.0000 "application " | Freq: Once | CUTANEOUS | Status: AC
  Administered 2019-11-23: 2 via TOPICAL

## 2019-11-23 MED ORDER — ROPIVACAINE HCL 5 MG/ML IJ SOLN
INTRAMUSCULAR | Status: DC | PRN
  Administered 2019-11-23: 20 mL via PERINEURAL

## 2019-11-23 MED ORDER — ONDANSETRON HCL 4 MG/2ML IJ SOLN
INTRAMUSCULAR | Status: AC
  Filled 2019-11-23: qty 2

## 2019-11-23 MED ORDER — FENTANYL CITRATE (PF) 100 MCG/2ML IJ SOLN
INTRAMUSCULAR | Status: AC
  Filled 2019-11-23: qty 2

## 2019-11-23 MED ORDER — DEXAMETHASONE SODIUM PHOSPHATE 10 MG/ML IJ SOLN
INTRAMUSCULAR | Status: DC | PRN
  Administered 2019-11-23: 8 mg via INTRAVENOUS

## 2019-11-23 MED ORDER — SODIUM CHLORIDE 3 % IN NEBU
INHALATION_SOLUTION | RESPIRATORY_TRACT | Status: AC
  Filled 2019-11-23: qty 4

## 2019-11-23 MED ORDER — ASPIRIN EC 325 MG PO TBEC: 325.0000 mg | DELAYED_RELEASE_TABLET | Freq: Two times a day (BID) | ORAL | 0 refills | Status: DC

## 2019-11-23 MED ORDER — PROPOFOL 500 MG/50ML IV EMUL
INTRAVENOUS | Status: AC
  Filled 2019-11-23: qty 50

## 2019-11-23 MED ORDER — HYDROMORPHONE HCL 1 MG/ML IJ SOLN
0.2500 mg | INTRAMUSCULAR | Status: DC | PRN
  Administered 2019-11-23 (×4): 0.5 mg via INTRAVENOUS

## 2019-11-23 MED ORDER — VANCOMYCIN HCL IN DEXTROSE 1-5 GM/200ML-% IV SOLN
1000.0000 mg | INTRAVENOUS | Status: AC
  Administered 2019-11-23: 1000 mg via INTRAVENOUS
  Filled 2019-11-23: qty 200

## 2019-11-23 MED ORDER — PROPOFOL 500 MG/50ML IV EMUL
INTRAVENOUS | Status: AC
  Filled 2019-11-23: qty 100

## 2019-11-23 MED ORDER — PHENYLEPHRINE 40 MCG/ML (10ML) SYRINGE FOR IV PUSH (FOR BLOOD PRESSURE SUPPORT)
PREFILLED_SYRINGE | INTRAVENOUS | Status: AC
  Filled 2019-11-23: qty 10

## 2019-11-23 MED ORDER — FENTANYL CITRATE (PF) 100 MCG/2ML IJ SOLN
INTRAMUSCULAR | Status: DC | PRN
  Administered 2019-11-23: 50 ug via INTRAVENOUS

## 2019-11-23 MED ORDER — PROPOFOL 500 MG/50ML IV EMUL
INTRAVENOUS | Status: DC | PRN
  Administered 2019-11-23: 30 ug/kg/min via INTRAVENOUS

## 2019-11-23 MED ORDER — TRANEXAMIC ACID-NACL 1000-0.7 MG/100ML-% IV SOLN
1000.0000 mg | INTRAVENOUS | Status: DC
  Filled 2019-11-23: qty 100

## 2019-11-23 MED ORDER — HYDROMORPHONE HCL 1 MG/ML IJ SOLN
INTRAMUSCULAR | Status: AC
  Filled 2019-11-23: qty 1

## 2019-11-23 MED ORDER — PHENYLEPHRINE HCL-NACL 10-0.9 MG/250ML-% IV SOLN
INTRAVENOUS | Status: DC | PRN
  Administered 2019-11-23: 25 ug/min via INTRAVENOUS

## 2019-11-23 MED ORDER — PROPOFOL 10 MG/ML IV BOLUS
INTRAVENOUS | Status: DC | PRN
  Administered 2019-11-23 (×2): 20 mg via INTRAVENOUS

## 2019-11-23 MED ORDER — SODIUM CHLORIDE 0.9% FLUSH
INTRAVENOUS | Status: DC | PRN
  Administered 2019-11-23: 50 mL

## 2019-11-23 MED ORDER — SODIUM CHLORIDE 0.9 % IR SOLN
Status: DC | PRN
  Administered 2019-11-23: 1000 mL

## 2019-11-23 MED ORDER — ALBUTEROL SULFATE (2.5 MG/3ML) 0.083% IN NEBU
2.5000 mg | INHALATION_SOLUTION | Freq: Four times a day (QID) | RESPIRATORY_TRACT | Status: DC
  Administered 2019-11-23: 2.5 mg via RESPIRATORY_TRACT

## 2019-11-23 MED ORDER — ALBUTEROL SULFATE (2.5 MG/3ML) 0.083% IN NEBU
INHALATION_SOLUTION | RESPIRATORY_TRACT | Status: AC
  Filled 2019-11-23: qty 3

## 2019-11-23 MED ORDER — BUPIVACAINE LIPOSOME 1.3 % IJ SUSP
INTRAMUSCULAR | Status: DC | PRN
  Administered 2019-11-23: 20 mL

## 2019-11-23 MED ORDER — MIDAZOLAM HCL 2 MG/2ML IJ SOLN
1.0000 mg | Freq: Once | INTRAMUSCULAR | Status: AC
  Administered 2019-11-23: 1 mg via INTRAVENOUS
  Filled 2019-11-23: qty 2

## 2019-11-23 MED ORDER — METHOCARBAMOL 500 MG PO TABS: 500.0000 mg | ORAL_TABLET | Freq: Four times a day (QID) | ORAL | Status: DC | PRN

## 2019-11-23 MED ORDER — DOCUSATE SODIUM 100 MG PO CAPS: 100.0000 mg | ORAL_CAPSULE | Freq: Two times a day (BID) | ORAL | 0 refills | Status: DC

## 2019-11-23 MED ORDER — CHLORHEXIDINE GLUCONATE 4 % EX LIQD: 60.0000 mL | Freq: Once | CUTANEOUS | Status: DC

## 2019-11-23 MED ORDER — HYDROMORPHONE HCL 2 MG PO TABS: 2.0000 mg | ORAL_TABLET | ORAL | 0 refills | Status: DC | PRN

## 2019-11-23 MED ORDER — LACTATED RINGERS IV SOLN: INTRAVENOUS | Status: DC

## 2019-11-23 MED ORDER — CLONIDINE HCL (ANALGESIA) 100 MCG/ML EP SOLN
EPIDURAL | Status: DC | PRN
  Administered 2019-11-23: 100 ug

## 2019-11-23 MED ORDER — LACTATED RINGERS IV BOLUS: 250.0000 mL | Freq: Once | INTRAVENOUS | Status: DC

## 2019-11-23 MED ORDER — ALBUTEROL SULFATE HFA 108 (90 BASE) MCG/ACT IN AERS
INHALATION_SPRAY | RESPIRATORY_TRACT | Status: DC | PRN
  Administered 2019-11-23: 6 via RESPIRATORY_TRACT

## 2019-11-23 MED ORDER — TRANEXAMIC ACID-NACL 1000-0.7 MG/100ML-% IV SOLN
INTRAVENOUS | Status: AC
  Filled 2019-11-23: qty 100

## 2019-11-23 MED ORDER — 0.9 % SODIUM CHLORIDE (POUR BTL) OPTIME
TOPICAL | Status: DC | PRN
  Administered 2019-11-23: 1000 mL

## 2019-11-23 MED ORDER — MIDAZOLAM HCL 5 MG/5ML IJ SOLN
INTRAMUSCULAR | Status: DC | PRN
  Administered 2019-11-23: 1 mg via INTRAVENOUS

## 2019-11-23 MED ORDER — MIDAZOLAM HCL 2 MG/2ML IJ SOLN
INTRAMUSCULAR | Status: AC
  Filled 2019-11-23: qty 2

## 2019-11-23 MED ORDER — PHENYLEPHRINE 40 MCG/ML (10ML) SYRINGE FOR IV PUSH (FOR BLOOD PRESSURE SUPPORT)
PREFILLED_SYRINGE | INTRAVENOUS | Status: DC | PRN
  Administered 2019-11-23 (×3): 120 ug via INTRAVENOUS
  Administered 2019-11-23: 80 ug via INTRAVENOUS

## 2019-11-23 MED ORDER — PROMETHAZINE HCL 25 MG/ML IJ SOLN: 6.2500 mg | INTRAMUSCULAR | Status: DC | PRN

## 2019-11-23 MED ORDER — LACTATED RINGERS IV BOLUS
500.0000 mL | Freq: Once | INTRAVENOUS | Status: AC
  Administered 2019-11-23: 500 mL via INTRAVENOUS

## 2019-11-23 SURGICAL SUPPLY — 56 items
APL SKNCLS STERI-STRIP NONHPOA (GAUZE/BANDAGES/DRESSINGS) ×1
ATTUNE PSFEM RTSZ5 NARCEM KNEE (Femur) ×2 IMPLANT
ATTUNE PSRP INSR SZ 5 10M KNEE (Insert) ×2 IMPLANT
BAG SPEC THK2 15X12 ZIP CLS (MISCELLANEOUS) ×1
BAG ZIPLOCK 12X15 (MISCELLANEOUS) ×3 IMPLANT
BASEPLATE TIBIAL ROTATING SZ 4 (Knees) ×2 IMPLANT
BENZOIN TINCTURE PRP APPL 2/3 (GAUZE/BANDAGES/DRESSINGS) ×3 IMPLANT
BLADE SAGITTAL 25.0X1.19X90 (BLADE) ×2 IMPLANT
BLADE SAGITTAL 25.0X1.19X90MM (BLADE) ×1
BLADE SAW SGTL 13.0X1.19X90.0M (BLADE) ×3 IMPLANT
BLADE SURG SZ10 CARB STEEL (BLADE) ×2 IMPLANT
BNDG ELASTIC 6X5.8 VLCR STR LF (GAUZE/BANDAGES/DRESSINGS) ×3 IMPLANT
BOOTIES KNEE HIGH SLOAN (MISCELLANEOUS) ×1 IMPLANT
BOWL SMART MIX CTS (DISPOSABLE) ×3 IMPLANT
BSPLAT TIB 4 CMNT ROT PLAT STR (Knees) ×1 IMPLANT
CEMENT HV SMART SET (Cement) ×6 IMPLANT
CLOSURE WOUND 1/2 X4 (GAUZE/BANDAGES/DRESSINGS) ×1
COVER SURGICAL LIGHT HANDLE (MISCELLANEOUS) ×3 IMPLANT
COVER WAND RF STERILE (DRAPES) IMPLANT
CUFF TOURN SGL QUICK 34 (TOURNIQUET CUFF) ×3
CUFF TRNQT CYL 34X4.125X (TOURNIQUET CUFF) ×1 IMPLANT
DECANTER SPIKE VIAL GLASS SM (MISCELLANEOUS) ×6 IMPLANT
DRAPE U-SHAPE 47X51 STRL (DRAPES) ×3 IMPLANT
DRSG AQUACEL AG ADV 3.5X10 (GAUZE/BANDAGES/DRESSINGS) ×1 IMPLANT
DURAPREP 26ML APPLICATOR (WOUND CARE) ×3 IMPLANT
ELECT REM PT RETURN 15FT ADLT (MISCELLANEOUS) ×3 IMPLANT
GLOVE BIOGEL PI IND STRL 8 (GLOVE) ×2 IMPLANT
GLOVE BIOGEL PI INDICATOR 8 (GLOVE) ×4
GLOVE ECLIPSE 7.5 STRL STRAW (GLOVE) ×6 IMPLANT
GOWN STRL REUS W/TWL XL LVL3 (GOWN DISPOSABLE) ×6 IMPLANT
HANDPIECE INTERPULSE COAX TIP (DISPOSABLE)
HOLDER FOLEY CATH W/STRAP (MISCELLANEOUS) IMPLANT
HOOD PEEL AWAY FLYTE STAYCOOL (MISCELLANEOUS) ×9 IMPLANT
KIT TURNOVER KIT A (KITS) ×2 IMPLANT
MANIFOLD NEPTUNE II (INSTRUMENTS) ×3 IMPLANT
NEEDLE HYPO 22GX1.5 SAFETY (NEEDLE) ×3 IMPLANT
NS IRRIG 1000ML POUR BTL (IV SOLUTION) ×1 IMPLANT
PACK ICE MAXI GEL EZY WRAP (MISCELLANEOUS) ×1 IMPLANT
PACK TOTAL KNEE CUSTOM (KITS) ×3 IMPLANT
PADDING CAST COTTON 6X4 STRL (CAST SUPPLIES) ×3 IMPLANT
PATELLA MEDIAL ATTUN 35MM KNEE (Knees) ×2 IMPLANT
PENCIL SMOKE EVACUATOR (MISCELLANEOUS) IMPLANT
PIN DRILL FIX HALF THREAD (BIT) ×2 IMPLANT
PIN STEINMAN FIXATION KNEE (PIN) ×2 IMPLANT
PROTECTOR NERVE ULNAR (MISCELLANEOUS) ×3 IMPLANT
SET HNDPC FAN SPRY TIP SCT (DISPOSABLE) ×1 IMPLANT
STRIP CLOSURE SKIN 1/2X4 (GAUZE/BANDAGES/DRESSINGS) ×1 IMPLANT
SUT MNCRL AB 3-0 PS2 18 (SUTURE) ×3 IMPLANT
SUT VIC AB 0 CT1 36 (SUTURE) ×3 IMPLANT
SUT VIC AB 1 CT1 36 (SUTURE) ×6 IMPLANT
SYR CONTROL 10ML LL (SYRINGE) ×6 IMPLANT
TRAY CATH 16FR W/PLASTIC CATH (SET/KITS/TRAYS/PACK) ×2 IMPLANT
TRAY FOLEY MTR SLVR 16FR STAT (SET/KITS/TRAYS/PACK) ×1 IMPLANT
WATER STERILE IRR 1000ML POUR (IV SOLUTION) ×6 IMPLANT
WRAP KNEE MAXI GEL POST OP (GAUZE/BANDAGES/DRESSINGS) ×2 IMPLANT
YANKAUER SUCT BULB TIP 10FT TU (MISCELLANEOUS) ×3 IMPLANT

## 2019-11-23 NOTE — Progress Notes (Signed)
Assisted Dr. Rose with right, ultrasound guided, adductor canal block. Side rails up, monitors on throughout procedure. See vital signs in flow sheet. Tolerated Procedure well.  

## 2019-11-23 NOTE — Anesthesia Postprocedure Evaluation (Signed)
Anesthesia Post Note  Patient: [name]  Procedure(s) Performed: TOTAL KNEE ARTHROPLASTY (Right Knee)     Patient location during evaluation: PACU Anesthesia Type: Spinal Level of consciousness: oriented and awake and alert Pain management: pain level controlled Vital Signs Assessment: post-procedure vital signs reviewed and stable Respiratory status: spontaneous breathing, respiratory function stable and patient connected to nasal cannula oxygen Cardiovascular status: blood pressure returned to baseline and stable Postop Assessment: no headache, no backache and no apparent nausea or vomiting Anesthetic complications: no    Last Vitals:  Vitals:   11/23/19 1415 11/23/19 1430  BP: 124/76 130/76  Pulse: 78 88  Resp: (!) 26 11  Temp:    SpO2: 96% 93%    Last Pain:  Vitals:   11/23/19 0941  TempSrc: Oral                 Breeze Berringer S

## 2019-11-23 NOTE — Transfer of Care (Signed)
Immediate Anesthesia Transfer of Care Note  Patient: [name]  Procedure(s) Performed: TOTAL KNEE ARTHROPLASTY (Right Knee)  Patient Location: PACU  Anesthesia Type:Spinal  Level of Consciousness: awake, alert , oriented and patient cooperative  Airway & Oxygen Therapy: Patient Spontanous Breathing and Patient connected to face mask oxygen  Post-op Assessment: Report given to RN and Post -op Vital signs reviewed and stable  Post vital signs: stable  Last Vitals:  Vitals Value Taken Time  BP 106/69 11/23/19 1400  Temp    Pulse 75 11/23/19 1406  Resp 20 11/23/19 1406  SpO2 96 % 11/23/19 1406  Vitals shown include unvalidated device data.  Last Pain:  Vitals:   11/23/19 0941  TempSrc: Oral         Complications: No apparent anesthesia complications

## 2019-11-23 NOTE — Anesthesia Procedure Notes (Signed)
Spinal  Patient location during procedure: OR Start time: 11/23/2019 12:00 PM End time: 11/23/2019 12:05 PM Staffing Performed: anesthesiologist  Anesthesiologist: Myrtie Soman, MD Preanesthetic Checklist Completed: patient identified, IV checked, site marked, risks and benefits discussed, surgical consent, monitors and equipment checked, pre-op evaluation and timeout performed Spinal Block Patient position: sitting Prep: Betadine Patient monitoring: heart rate, continuous pulse ox and blood pressure Approach: midline Location: L3-4 Injection technique: single-shot Needle Needle type: Sprotte  Needle gauge: 24 G Needle length: 9 cm Assessment Sensory level: T8 Events: paresthesia Additional Notes Expiration date of kit checked and confirmed. Patient tolerated procedure well, without complications.  L sided paresthesia , went away within a few seconds

## 2019-11-23 NOTE — Interval H&P Note (Signed)
History and Physical Interval Note:  11/23/2019 10:33 AM  [name]  has presented today for surgery, with the diagnosis of RIGHT KNEE DEGENERATIVE JOINT DISEASE.  The various methods of treatment have been discussed with the patient and family. After consideration of risks, benefits and other options for treatment, the patient has consented to  Procedure(s): TOTAL KNEE ARTHROPLASTY (Right) as a surgical intervention.  The patient's history has been reviewed, patient examined, no change in status, stable for surgery.  I have reviewed the patient's chart and labs.  Questions were answered to the patient's satisfaction.     Alta Corning

## 2019-11-23 NOTE — Op Note (Addendum)
PATIENT ID:      [name]  MRN:     [id number] DOB/AGE:    08-04-1966 / 54 y.o.       OPERATIVE REPORT   DATE OF PROCEDURE:  11/23/2019      PREOPERATIVE DIAGNOSIS:   RIGHT KNEE DEGENERATIVE JOINT DISEASE      Estimated body mass index is 40.81 kg/m as calculated from the following:   Height as of 11/20/19: 5\' 1"  (1.549 m).   Weight as of 11/20/19: 98 kg.                                                       POSTOPERATIVE DIAGNOSIS:   RIGHT KNEE DEGENERATIVE JOINT DISEASE                                                                       PROCEDURE:  Procedure(s): 1. TOTAL KNEE ARTHROPLASTY Using DepuyAttune RP implants #5N Femur, #4Tibia, 10 mm Attune RP bearing, 38 Patella 2. Lateral retinacular release open    SURGEON: Alta Corning  ASSISTANT: Gaspar Skeeters PA-C   (Present and scrubbed throughout the case, critical for assistance with exposure, retraction, instrumentation, and closure.)        ANESTHESIA: spinal, 20cc Exparel, 50cc 0.25% Marcaine EBL: min cc FLUID REPLACEMENT: unk cc crystaloid TOURNIQUET: DRAINS: None TRANEXAMIC ACID: 1gm IV, 2gm topical COMPLICATIONS:  None         INDICATIONS FOR PROCEDURE: The patient has  RIGHT KNEE DEGENERATIVE JOINT DISEASE, varus deformities, XR shows bone on bone arthritis, lateral subluxation of tibia. Patient has failed all conservative measures including anti-inflammatory medicines, narcotics, attempts at exercise and weight loss, cortisone injections and viscosupplementation.  Risks and benefits of surgery have been discussed, questions answered.   DESCRIPTION OF PROCEDURE: The patient identified by armband, received  IV antibiotics, in the holding area at [facility]. Patient taken to the operating room, appropriate anesthetic monitors were attached, and spinal anesthesia was  induced. IV Tranexamic acid was given.Tourniquet applied high to the operative thigh. Lateral post and foot positioner applied to the table, the  lower extremity was then prepped and draped in usual sterile fashion from the toes to the tourniquet. Time-out procedure was performed. The skin and subcutaneous tissue along the incision was injected with 20 cc of a mixture of Exparel and Marcaine solution, using a 20-gauge by 1-1/2 inch needle. We began the operation, with the knee flexed 130 degrees, by making the anterior midline incision starting at handbreadth above the patella going over the patella 1 cm medial to and 4 cm distal to the tibial tubercle. Small bleeders in the skin and the subcutaneous tissue identified and cauterized. Transverse retinaculum was incised and reflected medially and a medial parapatellar arthrotomy was accomplished. the patella was everted and theprepatellar fat pad resected. The superficial medial collateral ligament was then elevated from anterior to posterior along the proximal flare of the tibia and anterior half of the menisci resected. The knee was hyperflexed exposing bone on bone arthritis. Peripheral and notch osteophytes as well as the  cruciate ligaments were then resected. We continued to work our way around posteriorly along the proximal tibia, and externally rotated the tibia subluxing it out from underneath the femur. A McHale PCL retractor was placed through the notch and a lateral Hohmann retractor placed, and we then entered the proximal tibia in line with the Depuy starter drill in line with the axis of the tibia followed by an intramedullary guide rod and 0-degree posterior slope cutting guide. The tibial cutting guide, 4 degree posterior sloped, was pinned into place allowing resection of 2 mm of bone medially and 10 mm of bone laterally. Satisfied with the tibial resection, we then entered the distal femur 2 mm anterior to the PCL origin with the intramedullary guide rod and applied the distal femoral cutting guide set at 9 mm, with 5 degrees of valgus. This was pinned along the epicondylar axis. At this  point, the distal femoral cut was accomplished without difficulty. We then sized for a #5N femoral component and pinned the guide in 3 degrees of external rotation. The chamfer cutting guide was pinned into place. The anterior, posterior, and chamfer cuts were accomplished without difficulty followed by the Attune RP box cutting guide and the box cut. We also removed posterior osteophytes from the posterior femoral condyles. The posterior capsule was injected with Exparel solution. The knee was brought into full extension. We checked our extension gap and fit a 10 mm bearing. Distracting in extension with a lamina spreader,  bleeders in the posterior capsule, Posterior medial and posterior lateral gutter were cauterized.  The transexamic acid-soaked sponge was then placed in the gap of the knee in extension. The knee was flexed 30. The posterior patella cut was accomplished with the 9.5 mm Attune cutting guide, sized for a 53mm dome, and the fixation pegs drilled.The knee was then once again hyperflexed exposing the proximal tibia. We sized for a # 4 tibial base plate, applied the smokestack and the conical reamer followed by the the Delta fin keel punch. We then hammered into place the Attune RP trial femoral component, drilled the lugs, inserted a  10 mm trial bearing, trial patellar button, and took the knee through range of motion from 0-130 degrees. Medial and lateral ligamentous stability was checked. No thumb pressure was required for patellar Tracking. The tourniquet was released 50 min. All trial components were removed, mating surfaces irrigated with pulse lavage, and dried with suction and sponges. 10 cc of the Exparel solution was applied to the cancellus bone of the patella distal femur and proximal tibia.  After waiting 30 seconds, the bony surfaces were again, dried with sponges. A double batch of DePuy HV cement was mixed and applied to all bony metallic mating surfaces except for the posterior  condyles of the femur itself. In order, we hammered into place the tibial tray and removed excess cement, the femoral component and removed excess cement. The final Attune RP bearing was inserted, and the knee brought to full extension with compression. The patellar button was clamped into place, and excess cement removed. The knee was held at 30 flexion with compression, while the cement cured. The wound was irrigated out with normal saline solution pulse lavage. The rest of the Exparel was injected into the parapatellar arthrotomy, subcutaneous tissues, and periosteal tissues. The parapatellar arthrotomy was closed with running #1 Vicryl suture. The subcutaneous tissue with 0 and 2-0 undyed Vicryl suture, and the skin with running 3-0 SQ vicryl. An Aquacil and Ace wrap were applied.  The patient was taken to recovery room without difficulty.   Alta Corning 11/23/2019, 1:26 PM

## 2019-11-23 NOTE — Anesthesia Preprocedure Evaluation (Signed)
Anesthesia Evaluation  Patient identified by MRN, date of birth, ID band Patient awake    Reviewed: Allergy & Precautions, NPO status , Patient's Chart, lab work & pertinent test results  Airway Mallampati: II  TM Distance: <3 FB Neck ROM: Full    Dental no notable dental hx.    Pulmonary COPD, Current Smoker and Patient abstained from smoking.,    Pulmonary exam normal breath sounds clear to auscultation + decreased breath sounds      Cardiovascular hypertension, Normal cardiovascular exam Rhythm:Regular Rate:Normal     Neuro/Psych negative neurological ROS  negative psych ROS   GI/Hepatic negative GI ROS, Neg liver ROS,   Endo/Other  Morbid obesity  Renal/GU negative Renal ROS  negative genitourinary   Musculoskeletal negative musculoskeletal ROS (+)   Abdominal   Peds negative pediatric ROS (+)  Hematology negative hematology ROS (+)   Anesthesia Other Findings   Reproductive/Obstetrics negative OB ROS                             Anesthesia Physical Anesthesia Plan  ASA: III  Anesthesia Plan: Spinal   Post-op Pain Management:  Regional for Post-op pain   Induction: Intravenous  PONV Risk Score and Plan: 2 and Ondansetron, Dexamethasone and Treatment may vary due to age or medical condition  Airway Management Planned: Simple Face Mask  Additional Equipment:   Intra-op Plan:   Post-operative Plan:   Informed Consent: I have reviewed the patients History and Physical, chart, labs and discussed the procedure including the risks, benefits and alternatives for the proposed anesthesia with the patient or authorized representative who has indicated his/her understanding and acceptance.     Dental advisory given  Plan Discussed with: CRNA and Surgeon  Anesthesia Plan Comments:         Anesthesia Quick Evaluation

## 2019-11-23 NOTE — Anesthesia Procedure Notes (Signed)
Anesthesia Regional Block: Adductor canal block   Pre-Anesthetic Checklist: ,, timeout performed, Correct Patient, Correct Site, Correct Laterality, Correct Procedure, Correct Position, site marked, Risks and benefits discussed,  Surgical consent,  Pre-op evaluation,  At surgeon's request and post-op pain management  Laterality: Right  Prep: chloraprep       Needles:  Injection technique: Single-shot  Needle Type: Echogenic Needle     Needle Length: 9cm      Additional Needles:   Procedures:,,,, ultrasound used (permanent image in chart),,,,  Narrative:  Start time: 11/23/2019 10:48 AM End time: 11/23/2019 10:53 AM Injection made incrementally with aspirations every 5 mL.  Performed by: Personally  Anesthesiologist: Myrtie Soman, MD  Additional Notes: Patient tolerated the procedure well without complications

## 2019-11-23 NOTE — Anesthesia Procedure Notes (Addendum)
Procedure Name: MAC Date/Time: 11/23/2019 11:53 AM Performed by: Lissa Morales, CRNA Pre-anesthesia Checklist: Patient identified, Emergency Drugs available, Suction available, Patient being monitored and Timeout performed Patient Re-evaluated:Patient Re-evaluated prior to induction Oxygen Delivery Method: Simple face mask Placement Confirmation: positive ETCO2

## 2019-11-23 NOTE — Discharge Instructions (Signed)

## 2019-11-23 NOTE — Progress Notes (Signed)
PACU NURSING NOTE: Phase II - pt alert and oriented, pain tolerable per pt statement, vss, mae x 4, required min assistance to get dressed. DC instructions and AVS reviewed. Opportunity for questions provided multiple times during DC education, utilized Teach Back Method. Discussed safety while taking PO pain meds. Also discussed the importance of attending post op MD appointment and informed pt of date and time of appt. Reviewed signs and symptoms of infection and importance of calling MD if infection suspected. Stressed importance of doing PT as instructed by PT staff. Also reviewed Anesthesia post op instructions. Ice pack education reviewed as well. Copy of AVS provided to pt. IS device reviewed and placed in pt bag to take home as well. Pt escorted to exit via wheel chair. Family her to drive pt home and be with pt during post op home recovery.

## 2019-11-23 NOTE — Evaluation (Signed)
Physical Therapy Evaluation Patient Details Name: [name] MRN: [id number] DOB: [birth year] Today's Date: 11/23/2019   History of Present Illness  Patient is 54 y.o. female s/p Rt TKA on 11/23/19 with PMH significant for HTN, fibromyalgia, COPD, OA, and COVID-19.  Clinical Impression  [name] is a 54 y.o. female POD 0 s/p Rt TKA. Patient reports independence with mobility at baseline. Patient is now limited by functional impairments (see PT problem list below) and requires min guard/supervision for transfers and gait with RW. Patient was able to ambulate ~80 feet with RW and min guard assist and cues for safe walker management. Patient educated on safe sequencing for stair mobility, requiring min guard for safety and verbalized safe guarding position for people assisting with mobility. Patient instructed in exercises to facilitate ROM and circulation. Patient had multiple bouts of tears due to pain in Rt knee with gait, stairs, and exercises. She has met mobility at supervision/min guard level to safe for home discharge but was educated on concerns for pain management and being able to safely mobilize as pain increases. Patient was encouraged to consider benefits of remaining in acute setting for overnight stay and additional review of mobility. She will benefit from continued skilled PT interventions to address impairments and progress towards PLOF.     Follow Up Recommendations Follow surgeon's recommendation for DC plan and follow-up therapies    Equipment Recommendations  None recommended by PT    Recommendations for Other Services       Precautions / Restrictions Precautions Precautions: Fall Restrictions Weight Bearing Restrictions: No      Mobility  Bed Mobility Overal bed mobility: Needs Assistance Bed Mobility: Supine to Sit;Sit to Supine     Supine to sit: Supervision;HOB elevated Sit to supine: HOB elevated;Supervision   General bed mobility comments:  cues for use of gait belt to assist with Rt LE mobility. pt in tears initially with Rt knee mobility in bed.  Transfers Overall transfer level: Needs assistance Equipment used: Rolling walker (2 wheeled) Transfers: Sit to/from Stand Sit to Stand: Supervision         General transfer comment: cues for safe hand placement and technique with RW, no assist required for power up, cues required for safe reach back.  Ambulation/Gait Ambulation/Gait assistance: Min guard Gait Distance (Feet): 80 Feet Assistive device: Rolling walker (2 wheeled) Gait Pattern/deviations: Step-to pattern;Decreased stride length;Decreased stance time - right;Decreased step length - left Gait velocity: decreased   General Gait Details: cues for safe step pattern and proximity to RW throughout, cues to keep walker closer for safety. no overt LOB.  Stairs Stairs: Yes Stairs assistance: Supervision Stair Management: Two rails;Forwards;Step to pattern Number of Stairs: 3 General stair comments: cues for safe step to pattern " up with good, down with bad", min guard for safety, cues for safe guarding position. pt in tears with step up due to pain in Rt Knee.  Wheelchair Mobility    Modified Rankin (Stroke Patients Only)       Balance Overall balance assessment: Needs assistance Sitting-balance support: Feet supported Sitting balance-Leahy Scale: Fair     Standing balance support: Bilateral upper extremity supported;During functional activity Standing balance-Leahy Scale: Poor            Pertinent Vitals/Pain Pain Assessment: Faces Faces Pain Scale: Hurts worst Pain Location: Rt Knee Pain Descriptors / Indicators: Crying;Sharp Pain Intervention(s): Monitored during session;Repositioned    Home Living Family/patient expects to be discharged to:: Private residence Living Arrangements: Alone  Available Help at Discharge: Friend(s);Family(mom, sister-in-law, and 3 friends) Type of Home:  House Home Access: Stairs to enter Entrance Stairs-Rails: Can reach both Entrance Stairs-Number of Steps: 2 at back Home Layout: One level Home Equipment: Environmental consultant - 2 wheels;Walker - 4 wheels;Cane - single point;Bedside commode;Crutches;Tub bench;Shower seat Additional Comments: works at [employer] and "walks 18 miles everyday" manage dairy section    Prior Function Level of Independence: Independent          Hand Dominance   Dominant Hand: Right    Extremity/Trunk Assessment   Upper Extremity Assessment Upper Extremity Assessment: Overall WFL for tasks assessed    Lower Extremity Assessment Lower Extremity Assessment: Generalized weakness;RLE deficits/detail RLE Deficits / Details: pt with severe pain causing tears with straightening her knee RLE: Unable to fully assess due to pain RLE Sensation: WNL RLE Coordination: WNL       Communication   Communication: No difficulties  Cognition Arousal/Alertness: Awake/alert Behavior During Therapy: WFL for tasks assessed/performed Overall Cognitive Status: Within Functional Limits for tasks assessed             General Comments      Exercises Total Joint Exercises Ankle Circles/Pumps: Both;AROM;10 reps;Supine Quad Sets: AROM;5 reps;Supine;Right Short Arc Quad: AROM;5 reps;Supine;Right Heel Slides: AAROM;5 reps;Supine;Right Hip ABduction/ADduction: AROM;5 reps;Supine;Right Straight Leg Raises: AROM;5 reps;Supine;Right Long [employer]: AROM;5 reps;Seated;Right Knee Flexion: AROM;AAROM;5 reps;Seated;Right   Assessment/Plan    PT Assessment Patient needs continued PT services  PT Problem List Decreased strength;Decreased balance;Decreased mobility;Decreased range of motion;Decreased knowledge of use of DME;Decreased activity tolerance       PT Treatment Interventions DME instruction;Functional mobility training;Balance training;Patient/family education;Gait training;Stair training;Therapeutic exercise;Therapeutic  activities    PT Goals (Current goals can be found in the Care Plan section)  Acute Rehab PT Goals Patient Stated Goal: to go home PT Goal Formulation: With patient Time For Goal Achievement: 11/30/19 Potential to Achieve Goals: Good    Frequency 7X/week    AM-PAC PT "6 Clicks" Mobility  Outcome Measure Help needed turning from your back to your side while in a flat bed without using bedrails?: None Help needed moving from lying on your back to sitting on the side of a flat bed without using bedrails?: A Little Help needed moving to and from a bed to a chair (including a wheelchair)?: A Little Help needed standing up from a chair using your arms (e.g., wheelchair or bedside chair)?: A Little Help needed to walk in hospital room?: A Little Help needed climbing 3-5 steps with a railing? : A Little 6 Click Score: 19    End of Session Equipment Utilized During Treatment: Gait belt Activity Tolerance: Patient tolerated treatment well Patient left: with call bell/phone within reach;in bed Nurse Communication: Mobility status PT Visit Diagnosis: Muscle weakness (generalized) (M62.81);Difficulty in walking, not elsewhere classified (R26.2)    Time: 1324-4010 PT Time Calculation (min) (ACUTE ONLY): 54 min   Charges:   PT Evaluation $PT Eval Low Complexity: 1 Low PT Treatments $Gait Training: 23-37 mins $Therapeutic Exercise: 8-22 mins       Verner Mould, DPT Physical Therapist with [facility] [phone]  11/23/2019 8:01 PM

## 2019-11-23 NOTE — Anesthesia Procedure Notes (Signed)
Anesthesia Procedure Image    

## 2019-11-26 ENCOUNTER — Encounter: Payer: Self-pay | Admitting: *Deleted

## 2019-12-10 ENCOUNTER — Ambulatory Visit
Admission: RE | Admit: 2019-12-10 | Discharge: 2019-12-10 | Disposition: A | Discharge status: Home / Self Care | Payer: BC Managed Care – PPO | Source: Ambulatory Visit | Attending: Orthopaedic Surgery | Admitting: Orthopaedic Surgery

## 2019-12-10 ENCOUNTER — Other Ambulatory Visit: Payer: Self-pay

## 2019-12-10 DIAGNOSIS — M2580 Other specified joint disorders, unspecified joint: Secondary | ICD-10-CM

## 2020-01-21 ENCOUNTER — Ambulatory Visit: Payer: BC Managed Care – PPO | Admitting: Obstetrics and Gynecology

## 2020-01-25 ENCOUNTER — Other Ambulatory Visit: Payer: Self-pay | Admitting: Orthopedic Surgery

## 2020-01-25 DIAGNOSIS — M79604 Pain in right leg: Secondary | ICD-10-CM

## 2020-01-28 ENCOUNTER — Other Ambulatory Visit: Payer: Self-pay

## 2020-01-28 ENCOUNTER — Ambulatory Visit
Admission: RE | Admit: 2020-01-28 | Discharge: 2020-01-28 | Disposition: A | Discharge status: Home / Self Care | Payer: BC Managed Care – PPO | Source: Ambulatory Visit | Attending: Orthopedic Surgery | Admitting: Orthopedic Surgery

## 2020-01-28 DIAGNOSIS — M79604 Pain in right leg: Secondary | ICD-10-CM

## 2020-01-28 MED ORDER — GADOBENATE DIMEGLUMINE 529 MG/ML IV SOLN
20.0000 mL | Freq: Once | INTRAVENOUS | Status: AC | PRN
  Administered 2020-01-28: 20 mL via INTRAVENOUS

## 2020-02-06 NOTE — Progress Notes (Signed)
Gynecology Annual Exam  PCP: Lorelee Market, MD  Chief Complaint:  Chief Complaint  Patient presents with  . Gynecologic Exam    Night sweats/facial hair    History of Present Illness:Patient is a 54 y.o. G0P0000 presents for annual exam. The patient has no complaints today.   LMP: No LMP recorded. Patient is postmenopausal. Postmenopausal no bleeding concer   There is no notable family history of breast or ovarian cancer in her family.  The patient wears seatbelts: yes.   The patient has regular exercise: not asked.    The patient reports current symptoms of depression.     Review of Systems: Review of Systems  Constitutional: Positive for diaphoresis. Negative for chills, fever, malaise/fatigue and weight loss.  HENT: Negative for congestion.   Respiratory: Negative for cough and shortness of breath.   Cardiovascular: Negative for chest pain and palpitations.  Gastrointestinal: Negative for abdominal pain, constipation, diarrhea, heartburn, nausea and vomiting.  Genitourinary: Negative for dysuria, frequency and urgency.  Skin: Negative for itching and rash.  Neurological: Negative for dizziness and headaches.  Endo/Heme/Allergies: Negative for polydipsia.  Psychiatric/Behavioral: Positive for depression. Negative for hallucinations, memory loss, substance abuse and suicidal ideas. The patient is nervous/anxious.     Past Medical History:  Patient Active Problem List   Diagnosis Date Noted  . Primary osteoarthritis of right knee 11/23/2019  . Benign neoplasm of cecum   . Special screening for malignant neoplasms, colon   . Primary osteoarthritis of left knee 05/17/2016  . Tendinitis of right rotator cuff 05/17/2016  . Osteoarthritis of left shoulder 05/09/2015  . TOBACCO ABUSE 06/20/2008    Qualifier: History of  By: Marinell Blight, Dawn     . CONSTIPATION 06/18/2008    Qualifier: Diagnosis of  By: Earley Favor MD, Cat     . HEART MURMUR, BENIGN 06/18/2008    Qualifier: Diagnosis of  By: Ta MD, Cat     . OTHER RESPIRATORY COMPLICATIONS [id number]    Qualifier: Diagnosis of  By: Earley Favor MD, Cat       Past Surgical History:  Past Surgical History:  Procedure Laterality Date  . COLONOSCOPY WITH PROPOFOL N/A 09/03/2016   Procedure: COLONOSCOPY WITH PROPOFOL;  Surgeon: [name], MD;  Location: [facility];  Service: Endoscopy;  Laterality: N/A;  . FRACTURE SURGERY     left foot   . KNEE CARTILAGE SURGERY  2015   left  . LAPAROSCOPIC SALPINGOOPHERECTOMY    . left hand     1 for ganglion cyst and next yr replace "joints" in left hand  . PLANTAR FASCIA SURGERY     left foot  . SHOULDER INJECTION Right 05/17/2016   Procedure: SHOULDER INJECTION;  Surgeon: Dorna Leitz, MD;  Location: [place];  Service: Orthopedics;  Laterality: Right;  . SHOULDER SURGERY     x 2 on right shoulder  . TONSILLECTOMY    . TOTAL KNEE ARTHROPLASTY Left 05/17/2016   Procedure: TOTAL KNEE ARTHROPLASTY;  Surgeon: Dorna Leitz, MD;  Location: [place];  Service: Orthopedics;  Laterality: Left;  . TOTAL KNEE ARTHROPLASTY Right 11/23/2019   Procedure: TOTAL KNEE ARTHROPLASTY;  Surgeon: Dorna Leitz, MD;  Location: [facility];  Service: Orthopedics;  Laterality: Right;  . TOTAL SHOULDER ARTHROPLASTY Left 05/09/2015   Procedure: TOTAL SHOULDER ARTHROPLASTY;  Surgeon: Dorna Leitz, MD;  Location: [place];  Service: Orthopedics;  Laterality: Left;  . TUBAL LIGATION     tubes removed stated patient    Gynecologic History:  No LMP recorded. Patient is postmenopausal. Last Pap: Results were:  11/17/2016 NIL and HPV positive 12/14/2017  NIL and HR HPV negative    Last mammogram:  01/18/2019 Results were: BI-RAD I  01/25/2020 Results were: Gillian Shields II  Obstetric History: G0P0000  Family History:  History reviewed. No pertinent family history.  Social History:  Social History   Socioeconomic History  . Marital status: Single    Spouse name: Not on file  . Number of children: Not on  file  . Years of education: Not on file  . Highest education level: Not on file  Occupational History  . Not on file  Tobacco Use  . Smoking status: Current Some Day Smoker    Packs/day: 0.50    Years: 30.00    Pack years: 15.00    Types: Cigarettes  . Smokeless tobacco: Never Used  Substance and Sexual Activity  . Alcohol use: Yes    Comment: monthly  . Drug use: No  . Sexual activity: Yes    Birth control/protection: None  Other Topics Concern  . Not on file  Social History Narrative  . Not on file   Social Determinants of Health   Financial Resource Strain:   . Difficulty of Paying Living Expenses:   Food Insecurity:   . Worried About Charity fundraiser in the Last Year:   . Arboriculturist in the Last Year:   Transportation Needs:   . Film/video editor (Medical):   Marland Kitchen Lack of Transportation (Non-Medical):   Physical Activity:   . Days of Exercise per Week:   . Minutes of Exercise per Session:   Stress:   . Feeling of Stress :   Social Connections:   . Frequency of Communication with Friends and Family:   . Frequency of Social Gatherings with Friends and Family:   . Attends Religious Services:   . Active Member of Clubs or Organizations:   . Attends Archivist Meetings:   Marland Kitchen Marital Status:   Intimate Partner Violence:   . Fear of Current or Ex-Partner:   . Emotionally Abused:   Marland Kitchen Physically Abused:   . Sexually Abused:     Allergies:  Allergies  Allergen Reactions  . Augmentin [Amoxicillin-Pot Clavulanate] Anaphylaxis    Did it involve swelling of the face/tongue/throat, SOB, or low BP? Yes Did it involve sudden or severe rash/hives, skin peeling, or any reaction on the inside of your mouth or nose? No Did you need to seek medical attention at a hospital or doctor's office? Yes When did it last happen?15 years ago If all above answers are "NO", may proceed with cephalosporin use.   . Bee Venom Hives and Shortness Of Breath     Treats with benadryl  . Diclofenac Sodium Itching and Swelling    Swelling of eyes and face PENNSAID- topical gel  . Other Hives and Shortness Of Breath    Reaction to spider bites (treats with benadryl)  . Sulfa Antibiotics Anaphylaxis and Other (See Comments)    Childhood allergic reaction Sulfonamides    Medications: Prior to Admission medications   Medication Sig Start Date End Date Taking? Authorizing Provider  amLODipine (NORVASC) 10 MG tablet Take 10 mg by mouth daily.    [provider]  aspirin EC 325 MG tablet Take 1 tablet (325 mg total) by mouth 2 (two) times daily after a meal. Take x 1 month post op to decrease risk of blood clots. 11/23/19   Modena Slater,  Jeneen Rinks, PA-C  b complex vitamins tablet Take 1 tablet by mouth daily.    [provider]  cholecalciferol (VITAMIN D) 1000 units tablet Take 1,000 Units by mouth daily.     [provider]  docusate sodium (COLACE) 100 MG capsule Take 1 capsule (100 mg total) by mouth 2 (two) times daily. 11/23/19   Gary Fleet, PA-C  DULoxetine (CYMBALTA) 60 MG capsule Take 60 mg by mouth daily. 09/25/19   [provider]  fluticasone (FLONASE) 50 MCG/ACT nasal spray Place 1 spray into both nostrils daily.    [provider]  hydrochlorothiazide (HYDRODIURIL) 25 MG tablet Take 25 mg by mouth daily.    [provider]  HYDROcodone-acetaminophen (NORCO) 10-325 MG tablet Take 1 tablet by mouth 5 (five) times daily as needed for pain. 11/12/19   [provider]  HYDROmorphone (DILAUDID) 2 MG tablet Take 1-2 tablets (2-4 mg total) by mouth every 4 (four) hours as needed for severe pain (For breakthrough pain.). 11/23/19   Gary Fleet, PA-C  losartan (COZAAR) 100 MG tablet Take 100 mg by mouth daily.    [provider]  methocarbamol (ROBAXIN-750) 750 MG tablet Take 1 tablet (750 mg total) by mouth every 8 (eight) hours as needed for muscle spasms. 11/23/19   Gary Fleet, PA-C    Multiple Vitamin (MULTIVITAMIN WITH MINERALS) TABS tablet Take 1 tablet by mouth daily. 50 +    [provider]    Physical Exam Vitals: Blood pressure 135/63, pulse 100, height 5\' 1"  (1.549 m), weight 227 lb (103 kg).  General: NAD HEENT: normocephalic, anicteric Thyroid: no enlargement, no palpable nodules Pulmonary: No increased work of breathing, CTAB Cardiovascular: RRR, distal pulses 2+ Breast: Breast symmetrical, no tenderness, no palpable nodules or masses, no skin or nipple retraction present, no nipple discharge.  No axillary or supraclavicular lymphadenopathy. Abdomen: NABS, soft, non-tender, non-distended.  Umbilicus without lesions.  No hepatomegaly, splenomegaly or masses palpable. No evidence of hernia  Genitourinary:  External: Normal external female genitalia.  Normal urethral meatus, normal Bartholin's and Skene's glands.    Vagina: Normal vaginal mucosa, no evidence of prolapse.    Cervix: Grossly normal in appearance, no bleeding  Uterus: Non-enlarged, mobile, normal contour.  No CMT  Adnexa: ovaries non-enlarged, no adnexal masses  Rectal: deferred  Lymphatic: no evidence of inguinal lymphadenopathy Extremities: no edema, erythema, or tenderness Neurologic: Grossly intact Psychiatric: mood appropriate, affect full  Female chaperone present for pelvic and breast  portions of the physical exam  Depression screen [facility] 2/9 02/07/2020  Decreased Interest 2  Down, Depressed, Hopeless 1  PHQ - 2 Score 3  Altered sleeping 3  Tired, decreased energy 2  Change in appetite 0  Feeling bad or failure about yourself  0  Trouble concentrating 0  Moving slowly or fidgety/restless 0  Suicidal thoughts 0  PHQ-9 Score 8   GAD 7 : Generalized Anxiety Score 02/07/2020  Nervous, Anxious, on Edge 0  Control/stop worrying 0  Worry too much - different things 1  Trouble relaxing 3  Restless 1  Easily annoyed or irritable 1  Afraid - awful might happen 0  Total GAD 7  Score 6  Anxiety Difficulty Somewhat difficult     Assessment: 54 y.o. G0P0000 routine annual exam  Plan: Problem List Items Addressed This Visit    None    Visit Diagnoses    Encounter for gynecological examination without abnormal finding    -  Primary   Breast screening  Hirsutism       Relevant Orders   DHEA-sulfate   Androstenedione   Testosterone, Free, Total, SHBG   Recurrent major depressive disorder, in partial remission (HCC)       Relevant Medications   venlafaxine XR (EFFEXOR-XR) 75 MG 24 hr capsule      1) Mammogram - recommend yearly screening mammogram.  Mammogram Is up to date  2) STI screening  was notoffered and therefore not obtained  3) ASCCP guidelines and rational discussed.  Patient opts for every 3 years screening interval.  Next in 2022.  4) Osteoporosis  - per USPTF routine screening DEXA at age 24  5) Routine healthcare maintenance including cholesterol, diabetes screening discussed managed by PCP  6) Colonoscopy - UTD 2017  7)  Hirsutism - will check testosterone levels, DHEA-S, and androstanedione levels  8) Anxiety/Depression - switch to effexor from cymbalta.  Staying in [place] [facility] for help with neuropathic pain as well.  Discontinue cymbalta.  Effexor also with documented efficacy for vasomotor symptoms although off label use.    7) Return in about 4 weeks (around 03/06/2020) for medication follow up.     [name], MD Mosetta Pigeon, [facility] 02/07/2020, 10:14 AM

## 2020-02-07 ENCOUNTER — Ambulatory Visit (INDEPENDENT_AMBULATORY_CARE_PROVIDER_SITE_OTHER): Payer: BC Managed Care – PPO | Admitting: Obstetrics and Gynecology

## 2020-02-07 ENCOUNTER — Encounter: Payer: Self-pay | Admitting: Obstetrics and Gynecology

## 2020-02-07 ENCOUNTER — Other Ambulatory Visit: Payer: Self-pay

## 2020-02-07 VITALS — BP 135/63 | HR 100 | Ht 61.0 in | Wt 227.0 lb

## 2020-02-07 DIAGNOSIS — L680 Hirsutism: Secondary | ICD-10-CM

## 2020-02-07 DIAGNOSIS — Z1239 Encounter for other screening for malignant neoplasm of breast: Secondary | ICD-10-CM

## 2020-02-07 DIAGNOSIS — F3341 Major depressive disorder, recurrent, in partial remission: Secondary | ICD-10-CM | POA: Diagnosis not present

## 2020-02-07 DIAGNOSIS — Z01419 Encounter for gynecological examination (general) (routine) without abnormal findings: Secondary | ICD-10-CM

## 2020-02-07 MED ORDER — VENLAFAXINE HCL ER 75 MG PO CP24: 75.0000 mg | ORAL_CAPSULE | Freq: Every day | ORAL | 3 refills | Status: DC

## 2020-02-18 LAB — ANDROSTENEDIONE: Androstenedione: 29 ng/dL — ABNORMAL LOW (ref 41–262)

## 2020-02-18 LAB — TESTOSTERONE, FREE, TOTAL, SHBG
Sex Hormone Binding: 37 nmol/L (ref 17.3–125.0)
Testosterone, Free: 0.4 pg/mL (ref 0.0–4.2)
Testosterone: <3 ng/dL — ABNORMAL LOW (ref 3–41)

## 2020-02-18 LAB — DHEA-SULFATE: DHEA-SO4: 49.3 ug/dL (ref 41.2–243.7)

## 2020-03-04 ENCOUNTER — Other Ambulatory Visit: Payer: Self-pay

## 2020-03-04 ENCOUNTER — Other Ambulatory Visit: Payer: Self-pay | Admitting: Orthopaedic Surgery

## 2020-03-04 ENCOUNTER — Ambulatory Visit (INDEPENDENT_AMBULATORY_CARE_PROVIDER_SITE_OTHER): Payer: BC Managed Care – PPO | Admitting: Dermatology

## 2020-03-04 DIAGNOSIS — L988 Other specified disorders of the skin and subcutaneous tissue: Secondary | ICD-10-CM

## 2020-03-04 DIAGNOSIS — L570 Actinic keratosis: Secondary | ICD-10-CM

## 2020-03-04 DIAGNOSIS — L814 Other melanin hyperpigmentation: Secondary | ICD-10-CM

## 2020-03-04 DIAGNOSIS — L578 Other skin changes due to chronic exposure to nonionizing radiation: Secondary | ICD-10-CM | POA: Diagnosis not present

## 2020-03-04 DIAGNOSIS — L821 Other seborrheic keratosis: Secondary | ICD-10-CM

## 2020-03-04 DIAGNOSIS — M19071 Primary osteoarthritis, right ankle and foot: Secondary | ICD-10-CM

## 2020-03-04 NOTE — Patient Instructions (Addendum)
Recommend daily broad spectrum sunscreen SPF 30+ to sun-exposed areas, reapply every 2 hours as needed. Call for new or changing lesions.  Cryotherapy Aftercare  . Wash gently with soap and water everyday.   . Apply Vaseline and Band-Aid daily until healed.  Prior to procedure, discussed risks of blister formation, small wound, skin dyspigmentation, or rare scar following cryotherapy.   

## 2020-03-04 NOTE — Progress Notes (Signed)
   Follow-Up Visit   Subjective  [name] is a 54 y.o. female who presents for the following: Skin Problem.  Patient here today for a spot on her nose. Patient noticed it about 6 months ago. Feels scaly and if she scratches it the spot hurts. No treatment.  HxAK treated with LN2 in past. Patient also would like to ask about Botox to the frown complex.   The following portions of the chart were reviewed this encounter and updated as appropriate:     Review of Systems:  No other skin or systemic complaints except as noted in HPI or Assessment and Plan.  Objective  Well appearing patient in no apparent distress; mood and affect are within normal limits.  A focused examination was performed including face. Relevant physical exam findings are noted in the Assessment and Plan.  Objective  Left Nasal Dorsum: Pink brown scaly macule  Objective  Glabella: Rhytides and volume loss.   Assessment & Plan  AK (actinic keratosis) Left Nasal Dorsum  Vs ISK  Destruction of lesion - Left Nasal Dorsum  Destruction method: cryotherapy   Informed consent: discussed and consent obtained   Lesion destroyed using liquid nitrogen: Yes   Region frozen until ice ball extended beyond lesion: Yes   Outcome: patient tolerated procedure well with no complications   Post-procedure details: wound care instructions given    Elastosis of skin Glabella  Patient advised would start with 20 units for $260. She was also advised that she could potentially need more. Repeat treatments needed every 3-4 months to maintain effect.  Lentigines - Scattered tan macules - Discussed due to sun exposure - Benign, observe - Call for any changes  Seborrheic Keratoses - Stuck-on, waxy, tan-brown papules and plaques  - Discussed benign etiology and prognosis. - Observe - Call for any changes  Actinic Damage - diffuse scaly erythematous macules with underlying dyspigmentation - Recommend daily broad  spectrum sunscreen SPF 30+ to sun-exposed areas, reapply every 2 hours as needed.  - Call for new or changing lesions.    Return if symptoms worsen or fail to improve.  Graciella Belton, RMA, am acting as scribe for Brendolyn Patty, MD .  Documentation: I have reviewed the above documentation for accuracy and completeness, and I agree with the above.  Brendolyn Patty, MD

## 2020-03-07 ENCOUNTER — Other Ambulatory Visit: Payer: Self-pay | Admitting: Obstetrics and Gynecology

## 2020-03-07 MED ORDER — VENLAFAXINE HCL ER 150 MG PO CP24: 150.0000 mg | ORAL_CAPSULE | Freq: Every day | ORAL | 2 refills | Status: DC

## 2020-03-07 NOTE — Progress Notes (Signed)
Move 03/12/2020 appointment out 4 weeks

## 2020-03-07 NOTE — Progress Notes (Signed)
Labs reviewed with patient, has done well on Effexor 75mg .  Still noting some vasomotor symptoms so will increase to 150mg .  Move follow up appointment out 4 weeks

## 2020-03-10 NOTE — Progress Notes (Signed)
Done

## 2020-03-12 ENCOUNTER — Ambulatory Visit: Payer: BC Managed Care – PPO | Admitting: Obstetrics and Gynecology

## 2020-03-19 ENCOUNTER — Ambulatory Visit
Admission: RE | Admit: 2020-03-19 | Discharge: 2020-03-19 | Disposition: A | Discharge status: Home / Self Care | Payer: BC Managed Care – PPO | Source: Ambulatory Visit | Attending: Orthopaedic Surgery | Admitting: Orthopaedic Surgery

## 2020-03-19 ENCOUNTER — Other Ambulatory Visit: Payer: Self-pay

## 2020-03-19 DIAGNOSIS — M19071 Primary osteoarthritis, right ankle and foot: Secondary | ICD-10-CM

## 2020-04-04 ENCOUNTER — Ambulatory Visit (INDEPENDENT_AMBULATORY_CARE_PROVIDER_SITE_OTHER): Payer: BC Managed Care – PPO | Admitting: Obstetrics and Gynecology

## 2020-04-04 ENCOUNTER — Other Ambulatory Visit: Payer: Self-pay

## 2020-04-04 DIAGNOSIS — F32A Depression, unspecified: Secondary | ICD-10-CM

## 2020-04-04 DIAGNOSIS — F329 Major depressive disorder, single episode, unspecified: Secondary | ICD-10-CM | POA: Diagnosis not present

## 2020-04-04 DIAGNOSIS — F419 Anxiety disorder, unspecified: Secondary | ICD-10-CM

## 2020-04-04 NOTE — Progress Notes (Signed)
I connected with [name] hone and verified that I am speaking with the correct person using two identifiers.   I discussed the limitations, risks, security and privacy concerns of performing an evaluation and management service by telephone and the availability of in person appointments. I also discussed with the patient that there may be a patient responsible charge related to this service. The patient expressed understanding and agreed to proceed.  The patient was at home I spoke with the patient from my workstation phone The names of people involved in this encounter were: [name] , and [name]   Chief Complaint:  Chief Complaint  Patient presents with  . Follow-up    Anxiety/Depression    History of Present Illness: The patient is a 54 y.o. female presenting follow up for symptoms of anxiety and depression.  The patient is currently taking Effexor XR 150mg  for the management of her symptoms.  She has not had any recent situational stressors.  She reports good improvement in symptoms.  She denies anhedonia, risk taking behavior, irritability, social anxiety, agorophobia, feelings of guilt, feelings of worthlessness, suicidal ideation, homicidal ideation, auditory hallucinations and visual hallucinations. Symptoms have improved since last Richards.     The patient does have a pre-existing history of depression and anxiety.  She  does not a prior history of suicide attempts.    Minimal improvement in vasomotor symptoms with switch to Effexor  Review of Systems: Review of Systems  Constitutional: Negative.   Gastrointestinal: Negative.   Psychiatric/Behavioral: Negative.    Past Medical History:  Past Medical History:  Diagnosis Date  . Arthritis   . COPD (chronic obstructive pulmonary disease) ([place])   . COVID-19 10/23/2019   Asymptomatic  . Fibromyalgia   . History of colon polyps 2017  . Hypertension      Past Surgical History:  Past Surgical History:  Procedure Laterality Date  . COLONOSCOPY WITH PROPOFOL N/A 09/03/2016   Procedure: COLONOSCOPY WITH PROPOFOL;  Surgeon: [name], MD;  Location: [facility];  Service: Endoscopy;  Laterality: N/A;  . FRACTURE SURGERY     left foot   . KNEE CARTILAGE SURGERY  2015   left  . LAPAROSCOPIC SALPINGOOPHERECTOMY    . left hand     1 for ganglion cyst and next yr replace "joints" in left hand  . PLANTAR FASCIA SURGERY     left foot  . SHOULDER INJECTION Right 05/17/2016   Procedure: SHOULDER INJECTION;  Surgeon: Dorna Leitz, MD;  Location: [place];  Service: Orthopedics;  Laterality: Right;  . SHOULDER SURGERY     x 2 on right shoulder  . TONSILLECTOMY    . TOTAL KNEE ARTHROPLASTY Left 05/17/2016   Procedure: TOTAL KNEE ARTHROPLASTY;  Surgeon: Dorna Leitz, MD;  Location: [place];  Service: Orthopedics;  Laterality: Left;  . TOTAL KNEE ARTHROPLASTY Right 11/23/2019   Procedure: TOTAL KNEE ARTHROPLASTY;  Surgeon: Dorna Leitz, MD;  Location: [facility];  Service: Orthopedics;  Laterality: Right;  . TOTAL SHOULDER ARTHROPLASTY Left 05/09/2015   Procedure: TOTAL SHOULDER ARTHROPLASTY;  Surgeon: Dorna Leitz, MD;  Location: [place];  Service: Orthopedics;  Laterality: Left;  . TUBAL LIGATION     tubes removed stated patient    Gynecologic History: No LMP recorded. Patient is postmenopausal.  Obstetric History: G0P0000  Family History:  No family history on file.  Social History:  Social History   Socioeconomic History  . Marital status: Single  Spouse name: Not on file  . Number of children: Not on file  . Years of education: Not on file  . Highest education level: Not on file  Occupational History  . Not on file  Tobacco Use  . Smoking status: Current Some Day Smoker    Packs/day: 0.50    Years: 30.00    Pack years: 15.00    Types: Cigarettes  . Smokeless tobacco: Never Used  Substance and Sexual Activity  . Alcohol use: Yes     Comment: monthly  . Drug use: No  . Sexual activity: Yes    Birth control/protection: None  Other Topics Concern  . Not on file  Social History Narrative  . Not on file   Social Determinants of Health   Financial Resource Strain:   . Difficulty of Paying Living Expenses:   Food Insecurity:   . Worried About Charity fundraiser in the Last Year:   . Arboriculturist in the Last Year:   Transportation Needs:   . Film/video editor (Medical):   Marland Kitchen Lack of Transportation (Non-Medical):   Physical Activity:   . Days of Exercise per Week:   . Minutes of Exercise per Session:   Stress:   . Feeling of Stress :   Social Connections:   . Frequency of Communication with Friends and Family:   . Frequency of Social Gatherings with Friends and Family:   . Attends Religious Services:   . Active Member of Clubs or Organizations:   . Attends Archivist Meetings:   Marland Kitchen Marital Status:   Intimate Partner Violence:   . Fear of Current or Ex-Partner:   . Emotionally Abused:   Marland Kitchen Physically Abused:   . Sexually Abused:     Allergies:  Allergies  Allergen Reactions  . Augmentin [Amoxicillin-Pot Clavulanate] Anaphylaxis    Did it involve swelling of the face/tongue/throat, SOB, or low BP? Yes Did it involve sudden or severe rash/hives, skin peeling, or any reaction on the inside of your mouth or nose? No Did you need to seek medical attention at a hospital or doctor's office? Yes When did it last happen?15 years ago If all above answers are "NO", may proceed with cephalosporin use.   . Bee Venom Hives and Shortness Of Breath    Treats with benadryl  . Diclofenac Sodium Itching and Swelling    Swelling of eyes and face PENNSAID- topical gel  . Other Hives and Shortness Of Breath    Reaction to spider bites (treats with benadryl)  . Sulfa Antibiotics Anaphylaxis and Other (See Comments)    Childhood allergic reaction Sulfonamides    Medications: Prior to Admission  medications   Medication Sig Start Date End Date Taking? Authorizing Provider  b complex vitamins tablet Take 1 tablet by mouth daily.   Yes [provider]  cholecalciferol (VITAMIN D) 1000 units tablet Take 1,000 Units by mouth daily.    Yes [provider]  fluticasone (FLONASE) 50 MCG/ACT nasal spray Place 1 spray into both nostrils daily.   Yes [provider]  hydrochlorothiazide (HYDRODIURIL) 25 MG tablet Take 25 mg by mouth daily.   Yes [provider]  losartan (COZAAR) 100 MG tablet Take 100 mg by mouth daily.   Yes [provider]  Multiple Vitamin (MULTIVITAMIN WITH MINERALS) TABS tablet Take 1 tablet by mouth daily. 50 +   Yes [provider]  PERCOCET 10-325 MG tablet Take 1-2 tablets by mouth every 6 (  six) hours as needed. 04/03/20  Yes [provider]  venlafaxine XR (EFFEXOR-XR) 150 MG 24 hr capsule Take 1 capsule (150 mg total) by mouth daily. 03/07/20  Yes [name], MD  amLODipine (NORVASC) 10 MG tablet  02/22/20   [provider]    Physical Exam Vitals: There were no vitals filed for this Richards. No LMP recorded. Patient is postmenopausal.  No physical exam as this was a remote telephone Richards to promote social distancing during the current COVID-19 Pandemic   GAD 7 : Generalized Anxiety Score 02/07/2020  Nervous, Anxious, on Edge 0  Control/stop worrying 0  Worry too much - different things 1  Trouble relaxing 3  Restless 1  Easily annoyed or irritable 1  Afraid - awful might happen 0  Total GAD 7 Score 6  Anxiety Difficulty Somewhat difficult    Depression screen PHQ 2/9 02/07/2020  Decreased Interest 2  Down, Depressed, Hopeless 1  PHQ - 2 Score 3  Altered sleeping 3  Tired, decreased energy 2  Change in appetite 0  Feeling bad or failure about yourself  0  Trouble concentrating 0  Moving slowly or fidgety/restless 0  Suicidal thoughts 0  PHQ-9 Score 8    Depression screen  PHQ 2/9 02/07/2020  Decreased Interest 2  Down, Depressed, Hopeless 1  PHQ - 2 Score 3  Altered sleeping 3  Tired, decreased energy 2  Change in appetite 0  Feeling bad or failure about yourself  0  Trouble concentrating 0  Moving slowly or fidgety/restless 0  Suicidal thoughts 0  PHQ-9 Score 8     Assessment: 54 y.o. G0P0000 follow up anxiety depression  Plan: Problem List Items Addressed This Richards    None    Richards Diagnoses    Anxiety and depression    -  Primary      1) Vasomotor symptoms - still having although effexor is doing well as far as moods go.  2) Thyroid and B12 screen has been obtained previously  3) Telephone Time: 7:12 min  4) Return in about 3 months (around 07/05/2020) for medication follow up.    [name], MD, [facility], [facility] 04/04/2020, 10:17 AM

## 2020-05-19 ENCOUNTER — Other Ambulatory Visit: Payer: Self-pay | Admitting: Orthopaedic Surgery

## 2020-05-19 ENCOUNTER — Encounter (HOSPITAL_COMMUNITY): Payer: Self-pay | Admitting: Orthopaedic Surgery

## 2020-05-19 ENCOUNTER — Other Ambulatory Visit: Payer: Self-pay

## 2020-05-19 NOTE — Progress Notes (Signed)
Pt denies SOB, chest pain, and being under the care of a cardiologist. Pt stated that PCP is Dr. Brunetta Genera. Pt denies having a stress test, echo and cardiac cath. Pt denies having a chest x ray. Pt denies recent labs. Pt made aware to stop taking  Aspirin (unless otherwise advised by surgeon), vitamins, fish oil and herbal medications. Do not take any NSAIDs ie: Ibuprofen, Advil, Naproxen (Aleve), Motrin, BC and Goody Powder. Pt stated that she is scheduled to take COVID test on DOS at 0800. Pt made aware to continue to quarantine. Pt verbalized understanding of all pre-op instructions.

## 2020-05-20 ENCOUNTER — Ambulatory Visit (HOSPITAL_COMMUNITY)
Admission: RE | Admit: 2020-05-20 | Discharge: 2020-05-20 | Disposition: A | Discharge status: Home / Self Care | Payer: BC Managed Care – PPO | Attending: Orthopaedic Surgery | Admitting: Orthopaedic Surgery

## 2020-05-20 ENCOUNTER — Encounter (HOSPITAL_COMMUNITY)
Admission: RE | Disposition: A | Discharge status: Home / Self Care | Payer: Self-pay | Source: Home / Self Care | Attending: Orthopaedic Surgery

## 2020-05-20 ENCOUNTER — Other Ambulatory Visit (HOSPITAL_COMMUNITY)
Admission: RE | Admit: 2020-05-20 | Discharge: 2020-05-20 | Disposition: A | Discharge status: Home / Self Care | Payer: BC Managed Care – PPO | Source: Ambulatory Visit | Attending: Orthopaedic Surgery | Admitting: Orthopaedic Surgery

## 2020-05-20 ENCOUNTER — Encounter (HOSPITAL_COMMUNITY): Payer: Self-pay | Admitting: Orthopaedic Surgery

## 2020-05-20 ENCOUNTER — Ambulatory Visit (HOSPITAL_COMMUNITY): Payer: BC Managed Care – PPO | Admitting: Anesthesiology

## 2020-05-20 DIAGNOSIS — J449 Chronic obstructive pulmonary disease, unspecified: Secondary | ICD-10-CM | POA: Insufficient documentation

## 2020-05-20 DIAGNOSIS — Z888 Allergy status to other drugs, medicaments and biological substances status: Secondary | ICD-10-CM | POA: Insufficient documentation

## 2020-05-20 DIAGNOSIS — F17210 Nicotine dependence, cigarettes, uncomplicated: Secondary | ICD-10-CM | POA: Diagnosis not present

## 2020-05-20 DIAGNOSIS — A4902 Methicillin resistant Staphylococcus aureus infection, unspecified site: Secondary | ICD-10-CM | POA: Insufficient documentation

## 2020-05-20 DIAGNOSIS — Z882 Allergy status to sulfonamides status: Secondary | ICD-10-CM | POA: Insufficient documentation

## 2020-05-20 DIAGNOSIS — Z880 Allergy status to penicillin: Secondary | ICD-10-CM | POA: Diagnosis not present

## 2020-05-20 DIAGNOSIS — Z881 Allergy status to other antibiotic agents status: Secondary | ICD-10-CM | POA: Diagnosis not present

## 2020-05-20 DIAGNOSIS — M1990 Unspecified osteoarthritis, unspecified site: Secondary | ICD-10-CM | POA: Diagnosis not present

## 2020-05-20 DIAGNOSIS — Z20822 Contact with and (suspected) exposure to covid-19: Secondary | ICD-10-CM | POA: Insufficient documentation

## 2020-05-20 DIAGNOSIS — Z96653 Presence of artificial knee joint, bilateral: Secondary | ICD-10-CM | POA: Insufficient documentation

## 2020-05-20 DIAGNOSIS — M797 Fibromyalgia: Secondary | ICD-10-CM | POA: Insufficient documentation

## 2020-05-20 DIAGNOSIS — Z79899 Other long term (current) drug therapy: Secondary | ICD-10-CM | POA: Diagnosis not present

## 2020-05-20 DIAGNOSIS — I10 Essential (primary) hypertension: Secondary | ICD-10-CM | POA: Diagnosis not present

## 2020-05-20 DIAGNOSIS — L089 Local infection of the skin and subcutaneous tissue, unspecified: Secondary | ICD-10-CM | POA: Insufficient documentation

## 2020-05-20 HISTORY — PX: APPLICATION OF WOUND VAC: SHX5189

## 2020-05-20 HISTORY — DX: Pneumonia, unspecified organism: J18.9

## 2020-05-20 HISTORY — DX: Other allergic rhinitis: J30.89

## 2020-05-20 HISTORY — PX: INCISION AND DRAINAGE: SHX5863

## 2020-05-20 HISTORY — DX: Local infection of the skin and subcutaneous tissue, unspecified: L08.9

## 2020-05-20 LAB — BASIC METABOLIC PANEL
Anion gap: 13 (ref 5–15)
BUN: 17 mg/dL (ref 6–20)
CO2: 24 mmol/L (ref 22–32)
Calcium: 9.5 mg/dL (ref 8.9–10.3)
Chloride: 98 mmol/L (ref 98–111)
Creatinine, Ser: 0.63 mg/dL (ref 0.44–1.00)
GFR calc Af Amer: >60 mL/min (ref >60)
GFR calc non Af Amer: >60 mL/min (ref >60)
Glucose, Bld: 93 mg/dL (ref 70–99)
Potassium: 3.6 mmol/L (ref 3.5–5.1)
Sodium: 135 mmol/L (ref 135–145)

## 2020-05-20 LAB — SARS CORONAVIRUS 2 BY RT PCR (HOSPITAL ORDER, PERFORMED IN ~~LOC~~ HOSPITAL LAB): SARS Coronavirus 2: NEGATIVE

## 2020-05-20 SURGERY — INCISION AND DRAINAGE
Anesthesia: Monitor Anesthesia Care | Site: Foot | Laterality: Right

## 2020-05-20 MED ORDER — CLINDAMYCIN PHOSPHATE 900 MG/50ML IV SOLN
900.0000 mg | INTRAVENOUS | Status: AC
  Administered 2020-05-20: 900 mg via INTRAVENOUS
  Filled 2020-05-20: qty 50

## 2020-05-20 MED ORDER — ONDANSETRON HCL 4 MG/2ML IJ SOLN
INTRAMUSCULAR | Status: DC | PRN
  Administered 2020-05-20: 4 mg via INTRAVENOUS

## 2020-05-20 MED ORDER — MIDAZOLAM HCL 2 MG/2ML IJ SOLN
INTRAMUSCULAR | Status: AC
  Administered 2020-05-20: 2 mg via INTRAVENOUS
  Filled 2020-05-20: qty 2

## 2020-05-20 MED ORDER — 0.9 % SODIUM CHLORIDE (POUR BTL) OPTIME
TOPICAL | Status: DC | PRN
  Administered 2020-05-20: 3000 mL

## 2020-05-20 MED ORDER — PROPOFOL 10 MG/ML IV BOLUS
INTRAVENOUS | Status: AC
  Filled 2020-05-20: qty 20

## 2020-05-20 MED ORDER — LACTATED RINGERS IV SOLN: INTRAVENOUS | Status: DC

## 2020-05-20 MED ORDER — PROPOFOL 500 MG/50ML IV EMUL
INTRAVENOUS | Status: DC | PRN
  Administered 2020-05-20: 100 ug/kg/min via INTRAVENOUS

## 2020-05-20 MED ORDER — MIDAZOLAM HCL 2 MG/2ML IJ SOLN: 2.0000 mg | Freq: Once | INTRAMUSCULAR | Status: AC

## 2020-05-20 MED ORDER — ROPIVACAINE HCL 5 MG/ML IJ SOLN
INTRAMUSCULAR | Status: DC | PRN
  Administered 2020-05-20: 10 mL via PERINEURAL
  Administered 2020-05-20: 30 mL via PERINEURAL

## 2020-05-20 MED ORDER — ORAL CARE MOUTH RINSE
15.0000 mL | Freq: Once | OROMUCOSAL | Status: AC
  Administered 2020-05-20: 15 mL via OROMUCOSAL

## 2020-05-20 MED ORDER — VANCOMYCIN HCL 500 MG IV SOLR
INTRAVENOUS | Status: AC
  Filled 2020-05-20: qty 500

## 2020-05-20 MED ORDER — FENTANYL CITRATE (PF) 100 MCG/2ML IJ SOLN: 25.0000 ug | INTRAMUSCULAR | Status: DC | PRN

## 2020-05-20 MED ORDER — FENTANYL CITRATE (PF) 100 MCG/2ML IJ SOLN: 50.0000 ug | Freq: Once | INTRAMUSCULAR | Status: AC

## 2020-05-20 MED ORDER — 0.9 % SODIUM CHLORIDE (POUR BTL) OPTIME
TOPICAL | Status: DC | PRN
  Administered 2020-05-20: 1000 mL

## 2020-05-20 MED ORDER — FENTANYL CITRATE (PF) 100 MCG/2ML IJ SOLN
INTRAMUSCULAR | Status: AC
  Administered 2020-05-20: 50 ug via INTRAVENOUS
  Filled 2020-05-20: qty 2

## 2020-05-20 MED ORDER — PROPOFOL 10 MG/ML IV BOLUS
INTRAVENOUS | Status: DC | PRN
  Administered 2020-05-20: 20 mg via INTRAVENOUS

## 2020-05-20 MED ORDER — VANCOMYCIN HCL 500 MG IV SOLR
INTRAVENOUS | Status: DC | PRN
  Administered 2020-05-20: 500 mg

## 2020-05-20 MED ORDER — PROMETHAZINE HCL 25 MG/ML IJ SOLN: 6.2500 mg | INTRAMUSCULAR | Status: DC | PRN

## 2020-05-20 MED ORDER — CLONIDINE HCL (ANALGESIA) 100 MCG/ML EP SOLN
EPIDURAL | Status: DC | PRN
  Administered 2020-05-20: 100 ug

## 2020-05-20 SURGICAL SUPPLY — 54 items
BANDAGE ESMARK 6X9 LF (GAUZE/BANDAGES/DRESSINGS) IMPLANT
BNDG CMPR 9X4 STRL LF SNTH (GAUZE/BANDAGES/DRESSINGS)
BNDG CMPR 9X6 STRL LF SNTH (GAUZE/BANDAGES/DRESSINGS)
BNDG CMPR MED 10X6 ELC LF (GAUZE/BANDAGES/DRESSINGS) ×1
BNDG COHESIVE 4X5 TAN STRL (GAUZE/BANDAGES/DRESSINGS) IMPLANT
BNDG ELASTIC 4X5.8 VLCR STR LF (GAUZE/BANDAGES/DRESSINGS) ×3 IMPLANT
BNDG ELASTIC 6X10 VLCR STRL LF (GAUZE/BANDAGES/DRESSINGS) ×3 IMPLANT
BNDG ESMARK 4X9 LF (GAUZE/BANDAGES/DRESSINGS) IMPLANT
BNDG ESMARK 6X9 LF (GAUZE/BANDAGES/DRESSINGS)
BNDG GAUZE ELAST 4 BULKY (GAUZE/BANDAGES/DRESSINGS) ×3 IMPLANT
COVER SURGICAL LIGHT HANDLE (MISCELLANEOUS) ×3 IMPLANT
COVER WAND RF STERILE (DRAPES) IMPLANT
CUFF TOURN SGL QUICK 34 (TOURNIQUET CUFF) ×3
CUFF TRNQT CYL 34X4.125X (TOURNIQUET CUFF) ×1 IMPLANT
DRAPE U-SHAPE 47X51 STRL (DRAPES) ×3 IMPLANT
DRSG ADAPTIC 3X8 NADH LF (GAUZE/BANDAGES/DRESSINGS) ×3 IMPLANT
DRSG PAD ABDOMINAL 8X10 ST (GAUZE/BANDAGES/DRESSINGS) IMPLANT
DRSG TEGADERM 4X4.75 (GAUZE/BANDAGES/DRESSINGS) ×9 IMPLANT
DRSG TELFA 3X8 NADH (GAUZE/BANDAGES/DRESSINGS) ×3 IMPLANT
DRSG XEROFORM 1X8 (GAUZE/BANDAGES/DRESSINGS) IMPLANT
DURAPREP 26ML APPLICATOR (WOUND CARE) ×3 IMPLANT
ELECT REM PT RETURN 9FT ADLT (ELECTROSURGICAL) ×3
ELECTRODE REM PT RTRN 9FT ADLT (ELECTROSURGICAL) ×1 IMPLANT
GAUZE SPONGE 4X4 12PLY STRL (GAUZE/BANDAGES/DRESSINGS) IMPLANT
GAUZE SPONGE 4X4 12PLY STRL LF (GAUZE/BANDAGES/DRESSINGS) ×3 IMPLANT
GAUZE XEROFORM 1X8 LF (GAUZE/BANDAGES/DRESSINGS) ×3 IMPLANT
GLOVE BIOGEL M STRL SZ7.5 (GLOVE) ×3 IMPLANT
GLOVE INDICATOR 8.0 STRL GRN (GLOVE) ×3 IMPLANT
GOWN STRL REUS W/ TWL LRG LVL3 (GOWN DISPOSABLE) ×1 IMPLANT
GOWN STRL REUS W/ TWL XL LVL3 (GOWN DISPOSABLE) ×1 IMPLANT
GOWN STRL REUS W/TWL LRG LVL3 (GOWN DISPOSABLE) ×3
GOWN STRL REUS W/TWL XL LVL3 (GOWN DISPOSABLE) ×3
HANDPIECE INTERPULSE COAX TIP (DISPOSABLE) ×3
KIT BASIN OR (CUSTOM PROCEDURE TRAY) ×3 IMPLANT
KIT DRSG PREVENA PLUS 7DAY 125 (MISCELLANEOUS) ×3 IMPLANT
KIT PREVENA INCISION MGT 13 (CANNISTER) ×3 IMPLANT
KIT TURNOVER KIT B (KITS) ×3 IMPLANT
MANIFOLD NEPTUNE II (INSTRUMENTS) ×3 IMPLANT
NEEDLE 22X1 1/2 (OR ONLY) (NEEDLE) IMPLANT
NS IRRIG 1000ML POUR BTL (IV SOLUTION) ×3 IMPLANT
PACK ORTHO EXTREMITY (CUSTOM PROCEDURE TRAY) ×3 IMPLANT
PAD ARMBOARD 7.5X6 YLW CONV (MISCELLANEOUS) ×6 IMPLANT
PAD CAST 4YDX4 CTTN HI CHSV (CAST SUPPLIES) ×1 IMPLANT
PADDING CAST COTTON 4X4 STRL (CAST SUPPLIES) ×3
SET CYSTO W/LG BORE CLAMP LF (SET/KITS/TRAYS/PACK) ×3 IMPLANT
SET HNDPC FAN SPRY TIP SCT (DISPOSABLE) ×1 IMPLANT
SPONGE LAP 18X18 RF (DISPOSABLE) IMPLANT
STOCKINETTE IMPERVIOUS 9X36 MD (GAUZE/BANDAGES/DRESSINGS) IMPLANT
SUT ETHILON 2 0 FS 18 (SUTURE) IMPLANT
SUT ETHILON 3 0 PS 1 (SUTURE) ×6 IMPLANT
TUBE CONNECTING 12'X1/4 (SUCTIONS) ×1
TUBE CONNECTING 12X1/4 (SUCTIONS) ×2 IMPLANT
UNDERPAD 30X36 HEAVY ABSORB (UNDERPADS AND DIAPERS) ×3 IMPLANT
YANKAUER SUCT BULB TIP NO VENT (SUCTIONS) ×3 IMPLANT

## 2020-05-20 NOTE — Brief Op Note (Signed)
05/20/2020  5:01 PM  PATIENT:  Genoveva Ill Stepien  54 y.o. female  PRE-OPERATIVE DIAGNOSIS:  RIGHT FOOT WOUND infection  POST-OPERATIVE DIAGNOSIS:  RIGHT FOOT WOUND infection  PROCEDURE:   Right foot irrigation and debridement, deep skin, subcutaneus tissue and tendon Complex secondary closure of wound 4 cm Application of negative pressure wound dressing   SURGEON:  Surgeon(s) and Role:    Erle Crocker, MD - Primary  PHYSICIAN ASSISTANT: none  ASSISTANTS:  none  ANESTHESIA:   MAC, with popliteal block  EBL:  5 mL   BLOOD ADMINISTERED:none  DRAINS: none   LOCAL MEDICATIONS USED:  OTHER vancomycin powder  SPECIMEN:  Source of Specimen:  Foot wound, deep tissue  DISPOSITION OF SPECIMEN:  Microbiology  COUNTS:  YES  TOURNIQUET:  * No tourniquets in log *  DICTATION: .Dragon Dictation  PLAN OF CARE: Discharge to home after PACU  PATIENT DISPOSITION:  PACU - hemodynamically stable.   Delay start of Pharmacological VTE agent (>24hrs) due to surgical blood loss or risk of bleeding: not applicable

## 2020-05-20 NOTE — Discharge Instructions (Signed)
DR. Lucia Gaskins FOOT & ANKLE SURGERY POST-OP INSTRUCTIONS   Pain Management 1. The numbing medicine and your leg will last around 18 hours, take a dose of your pain medicine as soon as you feel it wearing off to avoid rebound pain. 2. Keep your foot elevated above heart level.  Make sure that your heel hangs free ('floats'). 3. Take all prescribed medication as directed. 4. If taking narcotic pain medication you may want to use an over-the-counter stool softener to avoid constipation. 5. You may take over-the-counter NSAIDs (ibuprofen, naproxen, etc.) as well as over-the-counter acetaminophen as directed on the packaging as a supplement for your pain and may also use it to wean away from the prescription medication.  Activity ? Non-weightbearing ? Keep dressing intact until follow up  First Postoperative Visit 1. Your first postop visit will be at least 2 weeks after surgery.  This should be scheduled when you schedule surgery. 2. If you do not have a postoperative visit scheduled please call [phone] to schedule an appointment. 3. At the appointment your incision will be evaluated for suture removal, x-rays will be obtained if necessary.  General Instructions 1. Swelling is very common after foot and ankle surgery.  It often takes 3 months for the foot and ankle to begin to feel comfortable.  Some amount of swelling will persist for 6-12 months. 2. DO NOT change the dressing.  If there is a problem with the dressing (too tight, loose, gets wet, etc.) please contact Dr. Pollie Friar office. 3. DO NOT get the dressing wet.  For showers you can use an over-the-counter cast cover or wrap a washcloth around the top of your dressing and then cover it with a plastic bag and tape it to your leg. 4. DO NOT soak the incision (no tubs, pools, bath, etc.) until you have approval from Dr. Lucia Gaskins.  Contact Dr. Huel Cote office or go to Emergency Room if: 1. Temperature above 101 F. 2. Increasing pain that is  unresponsive to pain medication or elevation 3. Excessive redness or swelling in your foot 4. Dressing problems - excessive bloody drainage, looseness or tightness, or if dressing gets wet 5. Develop pain, swelling, warmth, or discoloration of your calf

## 2020-05-20 NOTE — Transfer of Care (Signed)
Immediate Anesthesia Transfer of Care Note  Patient: Genoveva Ill Nettleton  Procedure(s) Performed: RIGHT FOOT INCISION AND DRAINAGE (Right Foot) APPLICATION OF WOUND VAC TO RIGHT FOOT (Right Foot)  Patient Location: PACU  Anesthesia Type:MAC and Regional  Level of Consciousness: awake, alert  and oriented  Airway & Oxygen Therapy: Patient Spontanous Breathing  Post-op Assessment: Report given to RN and Post -op Vital signs reviewed and stable  Post vital signs: Reviewed and stable  Last Vitals:  Vitals Value Taken Time  BP 99/69 05/20/20 1706  Temp 36.4 C 05/20/20 1706  Pulse 93 05/20/20 1710  Resp 28 05/20/20 1710  SpO2 97 % 05/20/20 1710  Vitals shown include unvalidated device data.  Last Pain:  Vitals:   05/20/20 1545  TempSrc:   PainSc: 0-No pain      Patients Stated Pain Goal: 4 (61/47/09 [id number])  Complications: No complications documented.

## 2020-05-20 NOTE — Anesthesia Preprocedure Evaluation (Signed)
Anesthesia Evaluation  Patient identified by MRN, date of birth, ID band Patient awake    Reviewed: Allergy & Precautions, NPO status , Patient's Chart, lab work & pertinent test results  Airway Mallampati: II  TM Distance: >3 FB Neck ROM: Full    Dental  (+) Dental Advisory Given, Missing   Pulmonary COPD, Current Smoker and Patient abstained from smoking.,    Pulmonary exam normal breath sounds clear to auscultation       Cardiovascular hypertension, Pt. on medications Normal cardiovascular exam Rhythm:Regular Rate:Normal     Neuro/Psych negative neurological ROS  negative psych ROS   GI/Hepatic negative GI ROS, Neg liver ROS,   Endo/Other  Morbid obesity  Renal/GU negative Renal ROS     Musculoskeletal  (+) Arthritis , Fibromyalgia -  Abdominal   Peds  Hematology negative hematology ROS (+)   Anesthesia Other Findings Day of surgery medications reviewed with the patient.  Reproductive/Obstetrics                             Anesthesia Physical Anesthesia Plan  ASA: III  Anesthesia Plan: Regional   Post-op Pain Management:    Induction: Intravenous  PONV Risk Score and Plan: 1 and Propofol infusion and Treatment may vary due to age or medical condition  Airway Management Planned: Nasal Cannula and Natural Airway  Additional Equipment:   Intra-op Plan:   Post-operative Plan:   Informed Consent: I have reviewed the patients History and Physical, chart, labs and discussed the procedure including the risks, benefits and alternatives for the proposed anesthesia with the patient or authorized representative who has indicated his/her understanding and acceptance.     Dental advisory given  Plan Discussed with: CRNA  Anesthesia Plan Comments:         Anesthesia Quick Evaluation

## 2020-05-20 NOTE — H&P (Signed)
PREOPERATIVE H&P  Chief Complaint: Right foot infection  HPI: [name] is a 54 y.o. female who presents for preoperative history and physical with a diagnosis of right foot infection.  Patient 6 weeks out for midfoot arthrodesis.  She showed up to clinic with some wound breakdown.  There was scab formation and foul odor and therefore she is indicated for irrigation and debridement.  She does have retained hardware.  We will send cultures.  Symptoms are rated as moderate to severe, and have been worsening.  This is significantly impairing activities of daily living.  She has elected for surgical management.   Past Medical History:  Diagnosis Date  . Arthritis   . COPD (chronic obstructive pulmonary disease) ([place])    denies  . COVID-19 10/23/2019   Asymptomatic  . Environmental and seasonal allergies   . Fibromyalgia   . History of colon polyps 2017  . Hypertension   . Pneumonia    aspiration PNA  . Wound infection    right foot   Past Surgical History:  Procedure Laterality Date  . COLONOSCOPY WITH PROPOFOL N/A 09/03/2016   Procedure: COLONOSCOPY WITH PROPOFOL;  Surgeon: [name], MD;  Location: [facility];  Service: Endoscopy;  Laterality: N/A;  . FRACTURE SURGERY     left foot   . KNEE CARTILAGE SURGERY  2015   left  . LAPAROSCOPIC SALPINGOOPHERECTOMY    . left hand     1 for ganglion cyst and next yr replace "joints" in left hand  . PLANTAR FASCIA SURGERY     left foot  . SHOULDER INJECTION Right 05/17/2016   Procedure: SHOULDER INJECTION;  Surgeon: Dorna Leitz, MD;  Location: [place];  Service: Orthopedics;  Laterality: Right;  . SHOULDER SURGERY     x 2 on right shoulder  . TONSILLECTOMY    . TOTAL KNEE ARTHROPLASTY Left 05/17/2016   Procedure: TOTAL KNEE ARTHROPLASTY;  Surgeon: Dorna Leitz, MD;  Location: [place];  Service: Orthopedics;  Laterality: Left;  . TOTAL KNEE ARTHROPLASTY Right 11/23/2019   Procedure: TOTAL KNEE ARTHROPLASTY;  Surgeon: Dorna Leitz,  MD;  Location: [facility];  Service: Orthopedics;  Laterality: Right;  . TOTAL SHOULDER ARTHROPLASTY Left 05/09/2015   Procedure: TOTAL SHOULDER ARTHROPLASTY;  Surgeon: Dorna Leitz, MD;  Location: [place];  Service: Orthopedics;  Laterality: Left;  . TUBAL LIGATION     tubes removed stated patient   Social History   Socioeconomic History  . Marital status: Single    Spouse name: Not on file  . Number of children: Not on file  . Years of education: Not on file  . Highest education level: Not on file  Occupational History  . Not on file  Tobacco Use  . Smoking status: Current Every Day Smoker    Packs/day: 0.50    Years: 30.00    Pack years: 15.00    Types: Cigarettes  . Smokeless tobacco: Never Used  Vaping Use  . Vaping Use: Never used  Substance and Sexual Activity  . Alcohol use: Not Currently  . Drug use: No  . Sexual activity: Yes    Birth control/protection: None  Other Topics Concern  . Not on file  Social History Narrative  . Not on file   Social Determinants of Health   Financial Resource Strain:   . Difficulty of Paying Living Expenses:   Food Insecurity:   . Worried About Charity fundraiser in the Last Year:   . [employer]  in the Last Year:   Transportation Needs:   . Film/video editor (Medical):   Marland Kitchen Lack of Transportation (Non-Medical):   Physical Activity:   . Days of Exercise per Week:   . Minutes of Exercise per Session:   Stress:   . Feeling of Stress :   Social Connections:   . Frequency of Communication with Friends and Family:   . Frequency of Social Gatherings with Friends and Family:   . Attends Religious Services:   . Active Member of Clubs or Organizations:   . Attends Archivist Meetings:   Marland Kitchen Marital Status:    History reviewed. No pertinent family history. Allergies  Allergen Reactions  . Augmentin [Amoxicillin-Pot Clavulanate] Anaphylaxis    Did it involve swelling of the face/tongue/throat, SOB, or low BP? Yes Did  it involve sudden or severe rash/hives, skin peeling, or any reaction on the inside of your mouth or nose? No Did you need to seek medical attention at a hospital or doctor's office? Yes When did it last happen?15 years ago If all above answers are "NO", may proceed with cephalosporin use.   . Bee Venom Hives and Shortness Of Breath    Treats with benadryl  . Diclofenac Sodium Itching and Swelling    Can tolerate Voltaren  Cannot use PENNSAID- topical gel  . Other Hives and Shortness Of Breath    Reaction to spider bites (treats with benadryl)  . Sulfa Antibiotics Anaphylaxis and Other (See Comments)    Childhood allergic reaction Sulfonamides   Prior to Admission medications   Medication Sig Start Date End Date Taking? Authorizing Provider  amLODipine (NORVASC) 10 MG tablet Take 10 mg by mouth daily.  02/22/20  Yes [provider]  b complex vitamins tablet Take 1 tablet by mouth daily.   Yes [provider]  cholecalciferol (VITAMIN D) 1000 units tablet Take 1,000 Units by mouth daily.    Yes [provider]  fluticasone (FLONASE) 50 MCG/ACT nasal spray Place 1 spray into both nostrils daily.   Yes [provider]  hydrochlorothiazide (HYDRODIURIL) 25 MG tablet Take 25 mg by mouth daily.   Yes [provider]  ibuprofen (ADVIL) 800 MG tablet Take 800 mg by mouth every 8 (eight) hours as needed for moderate pain.   Yes [provider]  losartan (COZAAR) 100 MG tablet Take 100 mg by mouth daily.   Yes [provider]  Multiple Vitamin (MULTIVITAMIN WITH MINERALS) TABS tablet Take 1 tablet by mouth daily. 50 +   Yes [provider]  PERCOCET 10-325 MG tablet Take 1-2 tablets by mouth every 6 (six) hours as needed for pain.  04/03/20  Yes [provider]  venlafaxine XR (EFFEXOR-XR) 150 MG 24 hr capsule Take 1 capsule (150 mg total) by mouth daily. 03/07/20  Yes [name], MD  VENTOLIN HFA 108 [phone]  Base) MCG/ACT inhaler Inhale 2 puffs into the lungs every 4 (four) hours as needed for wheezing or shortness of breath.  03/19/20  Yes [provider]     Positive ROS: All other systems have been reviewed and were otherwise negative with the exception of those mentioned in the HPI and as above.  Physical Exam:  General: Alert, no acute distress Cardiovascular: No pedal edema Respiratory: No cyanosis, no use of accessory musculature GI: No organomegaly, abdomen is soft and non-tender Skin: Wound breakdown on the dorsal aspect of the right foot. Neurologic: Sensation intact distally Psychiatric: Patient is competent for consent  with normal mood and affect Lymphatic: No axillary or cervical lymphadenopathy  MUSCULOSKELETAL: Right foot demonstrates small 3 cm area of wound breakdown.  There is beefy red base and no exposed tendon or bone.  Distally sensation is intact.  There is some surrounding erythema.  Foot is warm and well-perfused.  Assessment: Right foot wound breakdown.   Plan: Plan for irrigation and debridement and send cultures of right foot wound.  We will also plan for secondary closure of the wound and likely placement of a Prevena wound VAC.  The risks benefits and alternatives were discussed with the patient including but not limited to the risks of nonoperative treatment, versus surgical intervention including infection, bleeding, nerve injury,  blood clots, cardiopulmonary complications, morbidity, mortality, among others, and they were willing to proceed.      Erle Crocker, MD    05/20/2020 4:00 PM

## 2020-05-20 NOTE — Anesthesia Procedure Notes (Signed)
Anesthesia Regional Block: Popliteal block   Pre-Anesthetic Checklist: ,, timeout performed, Correct Patient, Correct Site, Correct Laterality, Correct Procedure, Correct Position, site marked, Risks and benefits discussed,  Surgical consent,  Pre-op evaluation,  At surgeon's request and post-op pain management  Laterality: Right  Prep: chloraprep       Needles:  Injection technique: Single-shot  Needle Type: Echogenic Needle     Needle Length: 9cm  Needle Gauge: 21     Additional Needles:   Procedures:,,,, ultrasound used (permanent image in chart),,,,  Narrative:  Start time: 05/20/2020 3:18 PM End time: 05/20/2020 3:28 PM Injection made incrementally with aspirations every 5 mL.  Performed by: Personally  Anesthesiologist: Catalina Gravel, MD  Additional Notes: No pain on injection. No increased resistance to injection. Injection made in 5cc increments.  Good needle visualization.  Patient tolerated procedure well.

## 2020-05-20 NOTE — Anesthesia Procedure Notes (Signed)
Anesthesia Regional Block: Adductor canal block   Pre-Anesthetic Checklist: ,, timeout performed, Correct Patient, Correct Site, Correct Laterality, Correct Procedure, Correct Position, site marked, Risks and benefits discussed,  Surgical consent,  Pre-op evaluation,  At surgeon's request and post-op pain management  Laterality: Right  Prep: chloraprep       Needles:  Injection technique: Single-shot  Needle Type: Echogenic Needle     Needle Length: 9cm  Needle Gauge: 21     Additional Needles:   Procedures:,,,, ultrasound used (permanent image in chart),,,,  Narrative:  Start time: 05/20/2020 3:28 PM End time: 05/20/2020 3:33 PM Injection made incrementally with aspirations every 5 mL.  Performed by: Personally  Anesthesiologist: Catalina Gravel, MD  Additional Notes: No pain on injection. No increased resistance to injection. Injection made in 5cc increments.  Good needle visualization.  Patient tolerated procedure well.

## 2020-05-20 NOTE — Anesthesia Postprocedure Evaluation (Signed)
Anesthesia Post Note  Patient: [name]  Procedure(s) Performed: RIGHT FOOT INCISION AND DRAINAGE (Right Foot) APPLICATION OF WOUND VAC TO RIGHT FOOT (Right Foot)     Patient location during evaluation: PACU Anesthesia Type: Regional Level of consciousness: awake and alert, awake and oriented Pain management: pain level controlled Vital Signs Assessment: post-procedure vital signs reviewed and stable Respiratory status: spontaneous breathing, nonlabored ventilation and respiratory function stable Cardiovascular status: stable and blood pressure returned to baseline Postop Assessment: no apparent nausea or vomiting Anesthetic complications: no   No complications documented.  Last Vitals:  Vitals:   05/20/20 1706 05/20/20 1720  BP: 99/69 127/80  Pulse: 89 89  Resp: 17 (!) 25  Temp: (!) 36.4 C   SpO2: 92% 98%    Last Pain:  Vitals:   05/20/20 1706  TempSrc:   PainSc: 0-No pain                 Catalina Gravel

## 2020-05-21 ENCOUNTER — Encounter (HOSPITAL_COMMUNITY): Payer: Self-pay | Admitting: Orthopaedic Surgery

## 2020-05-25 LAB — AEROBIC/ANAEROBIC CULTURE W GRAM STAIN (SURGICAL/DEEP WOUND): Gram Stain: NONE SEEN

## 2020-06-09 NOTE — Op Note (Addendum)
[name] female 54 y.o. 05/20/2020  PreOperative Diagnosis: Right foot dorsal wound infection  PostOperative Diagnosis: Right foot dorsal wound infection Complex wound 4 cm  PROCEDURE: Right foot irrigation and debridement, skin, subcutaneous tissue and tendon Complex secondary closure of wound, 4 cm Application of negative pressure wound dressing  SURGEON: Melony Overly, MD  ASSISTANT: None  ANESTHESIA: General  FINDINGS: Dorsal foot wound proximal he 4 cm with opening and beefy fibrinous tissue base with underlying fluid collection  IMPLANTS: None  INDICATIONS:53 y.o. femaleSmoker who had issues with her dorsal foot wound and some dehiscence with concern for surrounding cellulitis and deep soft tissue infection was indicated for irrigation debridement of her wound. She was placed on antibiotics at the time of our initial assessment in the clinic.  She had some improvement with the antibiotics but continued to have issues with wound drainage and an open wound.  She did recently have surgery and there was hardware underneath the wound and concern for deep infection.  Patient understood the risks, benefits and alternatives to surgery which include but are not limited to wound healing complications, infection, nonunion, malunion, need for further surgery as well as damage to surrounding structures. They also understood the potential for continued pain in that there were no guarantees of acceptable outcome After weighing these risks the patient opted to proceed with surgery.  PROCEDURE: Patient was identified in the preoperative holding area.  The right foot was marked by myself.  Consent was signed by myself and the patient.  Block was performed by anesthesia in the preoperative holding area.  Patient was taken to the operative suite and placed supine on the operative table.  General LMA anesthesia was induced without difficulty. Bump was placed under the operative hip and  bone foam was used.  All bony prominences were well padded. Preoperative antibiotics were given. The extremity was prepped and draped in the usual sterile fashion and surgical timeout was performed.   We began by inspecting the wound.  There is a proximally 4 cm portion of dehiscence of the wound.  There was underlying for venous base with some beefy red component.  The wound was irrigated with normal saline.  Then an incision was made along the course of the dehiscence of the wound.  The beefy red base was elevated gently off the underlying soft tissue.  There is exposed tendon sheath in this area.  There was some fluid which appeared to be serous type fluid without purulence noted.  The underlying tissue that appeared necrotic due to persistent fluid collection this area was sharply excised with a 15 blade.  The tendon sheath was identified and a portion of this was excised as well. Deep soft tissue cultures were sent to microbiology for evaluation. There was no gross abscess notable.  The wound edges medially and laterally were then elevated from the underlying tissue to mobilize them for closure.  Then the wound was closed in a layered fashion and we were able to fully close the wound without significant amount of tension.  The total wound length was 4 cm.  After the wound was closed the foot was cleaned.  Then a negative pressure wound dressing was placed with a sponge overlying the incision.  There was good suction on the device once the dressing was complete and there was no obvious leakage.  Then a soft dressing and Ace wrap was placed over top of the wound VAC.  The patient was then awakened from anesthesia and  taken recovery in stable condition.  There were no complications.  POST OPERATIVE INSTRUCTIONS: Patient will be discharged home from the PACU with the wound VAC. She will follow-up in 4 days for wound check and wound VAC removal She will continue antibiotics We will follow up on  cultures She will remain nonweightbearing.  TOURNIQUET TIME:No tourniquet was used  BLOOD LOSS: Minimal         DRAINS: none         SPECIMEN: none       COMPLICATIONS:  * No complications entered in OR log *         Disposition: Home from the PACU         Condition: Stable

## 2020-06-30 ENCOUNTER — Encounter: Payer: Self-pay | Admitting: Obstetrics and Gynecology

## 2020-06-30 ENCOUNTER — Other Ambulatory Visit: Payer: Self-pay

## 2020-06-30 ENCOUNTER — Ambulatory Visit (INDEPENDENT_AMBULATORY_CARE_PROVIDER_SITE_OTHER): Payer: BC Managed Care – PPO | Admitting: Obstetrics and Gynecology

## 2020-06-30 VITALS — Wt 241.0 lb

## 2020-06-30 DIAGNOSIS — Z6841 Body Mass Index (BMI) 40.0 and over, adult: Secondary | ICD-10-CM

## 2020-06-30 DIAGNOSIS — F32A Depression, unspecified: Secondary | ICD-10-CM

## 2020-06-30 DIAGNOSIS — F329 Major depressive disorder, single episode, unspecified: Secondary | ICD-10-CM

## 2020-06-30 DIAGNOSIS — G4700 Insomnia, unspecified: Secondary | ICD-10-CM | POA: Diagnosis not present

## 2020-06-30 DIAGNOSIS — F419 Anxiety disorder, unspecified: Secondary | ICD-10-CM

## 2020-06-30 MED ORDER — TRAZODONE HCL 50 MG PO TABS: 50.0000 mg | ORAL_TABLET | Freq: Every evening | ORAL | 11 refills | Status: DC | PRN

## 2020-06-30 MED ORDER — SAXENDA 18 MG/3ML ~~LOC~~ SOPN: PEN_INJECTOR | SUBCUTANEOUS | 0 refills | Status: DC

## 2020-06-30 MED ORDER — VENLAFAXINE HCL ER 150 MG PO CP24: 150.0000 mg | ORAL_CAPSULE | Freq: Every day | ORAL | 3 refills | Status: DC

## 2020-06-30 MED ORDER — NOVOFINE 32G X 6 MM MISC: 1.0000 [IU] | Freq: Every day | 3 refills | Status: DC

## 2020-06-30 NOTE — Progress Notes (Addendum)
I connected with [name]  on 06/30/2020 at  2:50 PM EDT by telephone and verified that I am speaking with the correct person using two identifiers.   I discussed the limitations, risks, security and privacy concerns of performing an evaluation and management service by telephone and the availability of in person appointments. I also discussed with the patient that there may be a patient responsible charge related to this service. The patient expressed understanding and agreed to proceed.  The patient was at home I spoke with the patient from my workstation phone The names of people involved in this encounter were: [name] , and [facility] Visit   Chief Complaint:  Chief Complaint  Patient presents with  . Follow-up    anxiety depression    History of Present Illness: The patient is a 55 y.o. female presenting follow up for symptoms of anxiety and depression, vasomotor symptoms.  The patient is currently taking Effexor XR 150mg  for the management of her symptoms.  She has hadrecent situational stressors (foort surgery with postoperative incisional complications).  She reports good control on her current regimen.  She denies anhedonia, day time somnolence, insomnia, risk taking behavior, irritability, social anxiety, agorophobia, feelings of guilt, feelings of worthlessness, suicidal ideation, homicidal ideation, auditory hallucinations and visual hallucinations. Symptoms have improved since last visit.     The patient does have a pre-existing history of depression and anxiety.  She  does not a prior history of suicide attempts.    Patientis a 54 [facility]. [place] female, who presents for the evaluation of weight gain. She has gained 20 pounds primarily over 6 months. The patient states the following issues have contributed to her weight problem: decreased physical activity following orthopedic procedure.  The patient has no additional  symptoms. The patient specifically denies memory loss, muscle weakness, excessive thirst, and polyuria. Weight related co-morbidities include none. The patient's past medical history is notable for none. She has tried phentermine interventions in the past with modest success.    Review of Systems: Review of Systems  Constitutional: Negative.   Gastrointestinal: Negative for nausea.  Neurological: Negative for headaches.  Psychiatric/Behavioral: Negative.      Past Medical History:  Past Medical History:  Diagnosis Date  . Arthritis   . COPD (chronic obstructive pulmonary disease) ([place])    denies  . COVID-19 10/23/2019   Asymptomatic  . Environmental and seasonal allergies   . Fibromyalgia   . History of colon polyps 2017  . Hypertension   . Pneumonia    aspiration PNA  . Wound infection    right foot    Past Surgical History:  Past Surgical History:  Procedure Laterality Date  . APPLICATION OF WOUND VAC Right 05/20/2020   Procedure: APPLICATION OF WOUND VAC TO RIGHT FOOT;  Surgeon: Erle Crocker, MD;  Location: [facility];  Service: Orthopedics;  Laterality: Right;  . COLONOSCOPY WITH PROPOFOL N/A 09/03/2016   Procedure: COLONOSCOPY WITH PROPOFOL;  Surgeon: [name], MD;  Location: [facility];  Service: Endoscopy;  Laterality: N/A;  . FRACTURE SURGERY     left foot   . INCISION AND DRAINAGE Right 05/20/2020   Procedure: RIGHT FOOT INCISION AND DRAINAGE;  Surgeon: Erle Crocker, MD;  Location: [facility];  Service: Orthopedics;  Laterality: Right;  LENGTH OF SURGERY: 1 HOUR  . KNEE CARTILAGE SURGERY  2015   left  . LAPAROSCOPIC SALPINGOOPHERECTOMY    . left hand  1 for ganglion cyst and next yr replace "joints" in left hand  . PLANTAR FASCIA SURGERY     left foot  . SHOULDER INJECTION Right 05/17/2016   Procedure: SHOULDER INJECTION;  Surgeon: Dorna Leitz, MD;  Location: [facility];  Service: Orthopedics;  Laterality: Right;  . SHOULDER SURGERY     x 2 on right  shoulder  . TONSILLECTOMY    . TOTAL KNEE ARTHROPLASTY Left 05/17/2016   Procedure: TOTAL KNEE ARTHROPLASTY;  Surgeon: Dorna Leitz, MD;  Location: [facility];  Service: Orthopedics;  Laterality: Left;  . TOTAL KNEE ARTHROPLASTY Right 11/23/2019   Procedure: TOTAL KNEE ARTHROPLASTY;  Surgeon: Dorna Leitz, MD;  Location: [facility];  Service: Orthopedics;  Laterality: Right;  . TOTAL SHOULDER ARTHROPLASTY Left 05/09/2015   Procedure: TOTAL SHOULDER ARTHROPLASTY;  Surgeon: Dorna Leitz, MD;  Location: [facility];  Service: Orthopedics;  Laterality: Left;  . TUBAL LIGATION     tubes removed stated patient    Gynecologic History: No LMP recorded. Patient is postmenopausal.  Obstetric History: G0P0000  Family History:  History reviewed. No pertinent family history.  Social History:  Social History   Socioeconomic History  . Marital status: Single    Spouse name: Not on file  . Number of children: Not on file  . Years of education: Not on file  . Highest education level: Not on file  Occupational History  . Not on file  Tobacco Use  . Smoking status: Current Every Day Smoker    Packs/day: 0.50    Years: 30.00    Pack years: 15.00    Types: Cigarettes  . Smokeless tobacco: Never Used  Vaping Use  . Vaping Use: Never used  Substance and Sexual Activity  . Alcohol use: Not Currently  . Drug use: No  . Sexual activity: Yes    Birth control/protection: None  Other Topics Concern  . Not on file  Social History Narrative  . Not on file   Social Determinants of Health   Financial Resource Strain:   . Difficulty of Paying Living Expenses: Not on file  Food Insecurity:   . Worried About Charity fundraiser in the Last Year: Not on file  . Ran Out of Food in the Last Year: Not on file  Transportation Needs:   . Lack of Transportation (Medical): Not on file  . Lack of Transportation (Non-Medical): Not on file  Physical Activity:   . Days of Exercise per Week: Not on file  . Minutes of  Exercise per Session: Not on file  Stress:   . Feeling of Stress : Not on file  Social Connections:   . Frequency of Communication with Friends and Family: Not on file  . Frequency of Social Gatherings with Friends and Family: Not on file  . Attends Religious Services: Not on file  . Active Member of Clubs or Organizations: Not on file  . Attends Archivist Meetings: Not on file  . Marital Status: Not on file  Intimate Partner Violence:   . Fear of Current or Ex-Partner: Not on file  . Emotionally Abused: Not on file  . Physically Abused: Not on file  . Sexually Abused: Not on file    Allergies:  Allergies  Allergen Reactions  . Augmentin [Amoxicillin-Pot Clavulanate] Anaphylaxis    Did it involve swelling of the face/tongue/throat, SOB, or low BP? Yes Did it involve sudden or severe rash/hives, skin peeling, or any reaction on the inside of your mouth or nose?  No Did you need to seek medical attention at a hospital or doctor's office? Yes When did it last happen?15 years ago If all above answers are "NO", may proceed with cephalosporin use.   . Bee Venom Hives and Shortness Of Breath    Treats with benadryl  . Diclofenac Sodium Itching and Swelling    Can tolerate Voltaren  Cannot use PENNSAID- topical gel  . Other Hives and Shortness Of Breath    Reaction to spider bites (treats with benadryl)  . Sulfa Antibiotics Anaphylaxis and Other (See Comments)    Childhood allergic reaction Sulfonamides    Medications: Prior to Admission medications   Medication Sig Start Date End Date Taking? Authorizing Provider  amLODipine (NORVASC) 10 MG tablet Take 10 mg by mouth daily.  02/22/20  Yes [provider]  b complex vitamins tablet Take 1 tablet by mouth daily.   Yes [provider]  cholecalciferol (VITAMIN D) 1000 units tablet Take 1,000 Units by mouth daily.    Yes [provider]  fluticasone (FLONASE) 50 MCG/ACT nasal spray Place 1  spray into both nostrils daily.   Yes [provider]  hydrochlorothiazide (HYDRODIURIL) 25 MG tablet Take 25 mg by mouth daily.   Yes [provider]  ibuprofen (ADVIL) 800 MG tablet Take 800 mg by mouth every 8 (eight) hours as needed for moderate pain.   Yes [provider]  losartan (COZAAR) 100 MG tablet Take 100 mg by mouth daily.   Yes [provider]  Multiple Vitamin (MULTIVITAMIN WITH MINERALS) TABS tablet Take 1 tablet by mouth daily. 50 +   Yes [provider]  venlafaxine XR (EFFEXOR-XR) 150 MG 24 hr capsule Take 1 capsule (150 mg total) by mouth daily. 03/07/20  Yes [name], MD  VENTOLIN HFA 108 [phone] Base) MCG/ACT inhaler Inhale 2 puffs into the lungs every 4 (four) hours as needed for wheezing or shortness of breath.  03/19/20  Yes [provider]    Physical Exam Vitals: Weight 241 lb (109.3 kg). Body mass index is 45.54 kg/m.  No LMP recorded. Patient is postmenopausal.  No physical exam as this was a remote telephone visit to promote social distancing during the current COVID-19 Pandemic  GAD 7 : Generalized Anxiety Score 06/30/2020 02/07/2020  Nervous, Anxious, on Edge 1 0  Control/stop worrying 0 0  Worry too much - different things 0 1  Trouble relaxing 1 3  Restless 0 1  Easily annoyed or irritable 1 1  Afraid - awful might happen 0 0  Total GAD 7 Score 3 6  Anxiety Difficulty Not difficult at all Somewhat difficult    Depression screen [facility] 2/9 06/30/2020 02/07/2020  Decreased Interest 1 2  Down, Depressed, Hopeless 1 1  PHQ - 2 Score 2 3  Altered sleeping 3 3  Tired, decreased energy 3 2  Change in appetite 0 0  Feeling bad or failure about yourself  0 0  Trouble concentrating 0 0  Moving slowly or fidgety/restless - 0  Suicidal thoughts 0 0  PHQ-9 Score 8 8  Difficult doing work/chores Not difficult at all -    Depression screen [facility] 2/9 06/30/2020 02/07/2020  Decreased Interest 1 2  Down,  Depressed, Hopeless 1 1  PHQ - 2 Score 2 3  Altered sleeping 3 3  Tired, decreased energy 3 2  Change in appetite 0 0  Feeling bad or failure about yourself  0 0  Trouble concentrating 0 0  Moving  slowly or fidgety/restless - 0  Suicidal thoughts 0 0  PHQ-9 Score 8 8  Difficult doing work/chores Not difficult at all -     Assessment: 54 y.o. G0P0000 gp;;pe ui[ smcoryu smf fr[trddopm  Plan: Problem List Items Addressed This Visit    None    Visit Diagnoses    Insomnia, unspecified type    -  Primary   Class 3 severe obesity without serious comorbidity with body mass index (BMI) of 40.0 to 44.9 in adult, unspecified obesity type (HCC)       Relevant Medications   Liraglutide -Weight Management (SAXENDA) 18 MG/3ML SOPN   Anxiety and depression       Relevant Medications   venlafaxine XR (EFFEXOR-XR) 150 MG 24 hr capsule   traZODone (DESYREL) 50 MG tablet      1) Continue Effexor XR 150mg  po daily - good improvement in moods - some improvement in vasomotor symptoms particularly day time  2) Obesity - previous trial of phentermine with limited success.  Unable to do Contrave secondary to concurrent SSRI use.  Will proceed with trial of saxenda - rx sent  2) Thyroid and B12 screen has not been obtained previously  3) Insomnia - problems falling asleep, no problems with sleeping and waking feeling unrested or OSA symptoms - Rx trazodone 50mg , was on previously  4) Telephone Time: 14:72min  4) Return in about 3 months (around 09/30/2020) for medication follow .    [name], MD, [facility], [facility] 06/30/2020, 3:24 PM

## 2020-07-01 ENCOUNTER — Telehealth: Payer: Self-pay

## 2020-07-01 NOTE — Telephone Encounter (Signed)
Pt calling; wants to talk to AMS about medication called in yesterday.  Please return call asap.  [phone]  Pt is calling about saxenda; pharm is waiting on PA before they order it for pt.

## 2020-07-02 NOTE — Telephone Encounter (Signed)
I am aware and have received the PA request for the Saxenda. Pt is aware I will be working on it.

## 2020-07-04 ENCOUNTER — Other Ambulatory Visit: Payer: Self-pay | Admitting: Orthopedic Surgery

## 2020-07-04 DIAGNOSIS — M25552 Pain in left hip: Secondary | ICD-10-CM

## 2020-07-08 NOTE — Telephone Encounter (Signed)
Unable to get PA approved through CoverMyMeds due to medication is not covered. I called the Rx plan number on the back of the pt's insurance card and spoke to Dess N. She states she received the same notification and gave me additional information regarding proceeding with the PA.  She states pt must call the insurance because they offer a special program for weight loss called [place].   I will reach out to pt and forward the information.

## 2020-07-19 ENCOUNTER — Ambulatory Visit
Admission: RE | Admit: 2020-07-19 | Discharge: 2020-07-19 | Disposition: A | Discharge status: Home / Self Care | Payer: BC Managed Care – PPO | Source: Ambulatory Visit | Attending: Orthopedic Surgery | Admitting: Orthopedic Surgery

## 2020-07-19 DIAGNOSIS — M25552 Pain in left hip: Secondary | ICD-10-CM

## 2020-07-20 ENCOUNTER — Emergency Department (HOSPITAL_COMMUNITY)
Admission: EM | Admit: 2020-07-20 | Discharge: 2020-07-20 | Discharge status: Absent without leave | Payer: BC Managed Care – PPO

## 2020-07-21 ENCOUNTER — Emergency Department (HOSPITAL_COMMUNITY)
Admission: EM | Admit: 2020-07-21 | Discharge: 2020-07-22 | Disposition: A | Discharge status: Home / Self Care | Payer: BC Managed Care – PPO | Attending: Emergency Medicine | Admitting: Emergency Medicine

## 2020-07-21 ENCOUNTER — Encounter (HOSPITAL_COMMUNITY): Payer: Self-pay | Admitting: Emergency Medicine

## 2020-07-21 DIAGNOSIS — M7989 Other specified soft tissue disorders: Secondary | ICD-10-CM | POA: Diagnosis not present

## 2020-07-21 DIAGNOSIS — F17200 Nicotine dependence, unspecified, uncomplicated: Secondary | ICD-10-CM | POA: Insufficient documentation

## 2020-07-21 DIAGNOSIS — M79651 Pain in right thigh: Secondary | ICD-10-CM | POA: Diagnosis not present

## 2020-07-21 DIAGNOSIS — Z5321 Procedure and treatment not carried out due to patient leaving prior to being seen by health care provider: Secondary | ICD-10-CM | POA: Insufficient documentation

## 2020-07-21 DIAGNOSIS — J449 Chronic obstructive pulmonary disease, unspecified: Secondary | ICD-10-CM | POA: Diagnosis not present

## 2020-07-21 LAB — COMPREHENSIVE METABOLIC PANEL
ALT: 22 U/L (ref 0–44)
AST: 22 U/L (ref 15–41)
Albumin: 3.6 g/dL (ref 3.5–5.0)
Alkaline Phosphatase: 98 U/L (ref 38–126)
Anion gap: 12 (ref 5–15)
BUN: 15 mg/dL (ref 6–20)
CO2: 28 mmol/L (ref 22–32)
Calcium: 9.1 mg/dL (ref 8.9–10.3)
Chloride: 99 mmol/L (ref 98–111)
Creatinine, Ser: 0.79 mg/dL (ref 0.44–1.00)
GFR calc Af Amer: >60 mL/min (ref >60)
GFR calc non Af Amer: >60 mL/min (ref >60)
Glucose, Bld: 122 mg/dL — ABNORMAL HIGH (ref 70–99)
Potassium: 4.1 mmol/L (ref 3.5–5.1)
Sodium: 139 mmol/L (ref 135–145)
Total Bilirubin: 0.3 mg/dL (ref 0.3–1.2)
Total Protein: 6.8 g/dL (ref 6.5–8.1)

## 2020-07-21 LAB — CBC WITH DIFFERENTIAL/PLATELET
Abs Immature Granulocytes: 0.06 10*3/uL (ref 0.00–0.07)
Basophils Absolute: 0.1 10*3/uL (ref 0.0–0.1)
Basophils Relative: 1 %
Eosinophils Absolute: 0.4 10*3/uL (ref 0.0–0.5)
Eosinophils Relative: 3 %
HCT: 37.8 % (ref 36.0–46.0)
Hemoglobin: 12.2 g/dL (ref 12.0–15.0)
Immature Granulocytes: 1 %
Lymphocytes Relative: 21 %
Lymphs Abs: 2.3 10*3/uL (ref 0.7–4.0)
MCH: 32.1 pg (ref 26.0–34.0)
MCHC: 32.3 g/dL (ref 30.0–36.0)
MCV: 99.5 fL (ref 80.0–100.0)
Monocytes Absolute: 0.7 10*3/uL (ref 0.1–1.0)
Monocytes Relative: 7 %
Neutro Abs: 7.4 10*3/uL (ref 1.7–7.7)
Neutrophils Relative %: 67 %
Platelets: 415 10*3/uL — ABNORMAL HIGH (ref 150–400)
RBC: 3.8 MIL/uL — ABNORMAL LOW (ref 3.87–5.11)
RDW: 13.3 % (ref 11.5–15.5)
WBC: 10.9 10*3/uL — ABNORMAL HIGH (ref 4.0–10.5)
nRBC: 0 % (ref 0.0–0.2)

## 2020-07-21 LAB — LACTIC ACID, PLASMA: Lactic Acid, Venous: 1.6 mmol/L (ref 0.5–1.9)

## 2020-07-21 NOTE — ED Triage Notes (Addendum)
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 Pt also has pain in right thigh with walking. Pt sounds congested and labored during triage with a lot of talking but states she has COPD and still smoking and doesn't feel this is worse than normal.

## 2020-07-21 NOTE — ED Notes (Signed)
Pt states that she has to leave because of her ride.

## 2020-07-26 ENCOUNTER — Other Ambulatory Visit: Payer: BC Managed Care – PPO

## 2020-08-07 ENCOUNTER — Other Ambulatory Visit: Payer: Self-pay | Admitting: Pain Medicine

## 2020-08-07 DIAGNOSIS — G8929 Other chronic pain: Secondary | ICD-10-CM

## 2020-08-21 ENCOUNTER — Other Ambulatory Visit: Payer: Self-pay

## 2020-08-21 ENCOUNTER — Ambulatory Visit (INDEPENDENT_AMBULATORY_CARE_PROVIDER_SITE_OTHER): Payer: BC Managed Care – PPO | Admitting: Dermatology

## 2020-08-21 ENCOUNTER — Telehealth: Payer: Self-pay | Admitting: Dermatology

## 2020-08-21 ENCOUNTER — Encounter: Payer: Self-pay | Admitting: Dermatology

## 2020-08-21 DIAGNOSIS — S91302A Unspecified open wound, left foot, initial encounter: Secondary | ICD-10-CM | POA: Diagnosis not present

## 2020-08-21 DIAGNOSIS — T148XXA Other injury of unspecified body region, initial encounter: Secondary | ICD-10-CM

## 2020-08-21 DIAGNOSIS — S91301A Unspecified open wound, right foot, initial encounter: Secondary | ICD-10-CM | POA: Diagnosis not present

## 2020-08-21 DIAGNOSIS — R21 Rash and other nonspecific skin eruption: Secondary | ICD-10-CM

## 2020-08-21 MED ORDER — MUPIROCIN 2 % EX OINT: TOPICAL_OINTMENT | CUTANEOUS | 1 refills | Status: DC

## 2020-08-21 MED ORDER — TRIAMCINOLONE ACETONIDE 0.1 % EX OINT: TOPICAL_OINTMENT | CUTANEOUS | 0 refills | Status: DC

## 2020-08-21 NOTE — Progress Notes (Signed)
   Follow-Up Visit   Subjective  [name] is a 54 y.o. female who presents for the following: Rash.  Patient presents today for a rash x 1 month on b/l lower legs and forearms, very itchy, and hurts when she walks. Patient has tried OTC  anti itch cream. She reports new swelling of lower legs in the last few months. She had blood work done at primary care recently.   The following portions of the chart were reviewed this encounter and updated as appropriate:  Tobacco  Allergies  Meds  Problems  Med Hx  Surg Hx  Fam Hx      Review of Systems:  No other skin or systemic complaints except as noted in HPI or Assessment and Plan.  Objective  Well appearing patient in no apparent distress; mood and affect are within normal limits.  A focused examination was performed including extremities, including the arms, hands, fingers, and fingernails and the legs, feet, toes, and toenails. Relevant physical exam findings are noted in the Assessment and Plan.  Objective  B/L lower leg: Erythematous papules coalescing to plaques rare small tense bola at b/l lower leg  Erythematous papules coalescing to plaques to right forearm   Objective  B/L Foot: Crusted open wound bilateral dorsal feet, right > left   Assessment & Plan  Rash B/L lower leg  Favor stasis dermatitis with edema bullae New onset edema Labs from primary care reviewed and include CBC, CMP, lipid panel and TSH, but no BNP  Recommend adding BNP. If BNP unremarkable, would consider trial of stopping amlodipine if an appropriate alternate medication can be found as this can cause edema (patient does note she has been on it for years making it less likely the culprit)  Start TMC 0.1%  ointment twice a day as needed. Avoid applying to face, groin, and axilla. Use as directed. Risk of skin atrophy with long-term use reviewed.   Start compression stocking 20 to 30 mmHg pending approval from orthopeadics  2+ pedal  pulses bilaterally  Topical steroids (such as triamcinolone, fluocinolone, fluocinonide, mometasone, clobetasol, halobetasol, betamethasone, hydrocortisone) can cause thinning and lightening of the skin if they are used for too long in the same area. Your physician has selected the right strength medicine for your problem and area affected on the body. Please use your medication only as directed by your physician to prevent side effects.    triamcinolone ointment (KENALOG) 0.1 % - B/L lower leg  Open wound B/L Foot  Start mupirocin daily followed by bandage once a day until healed  Keeping wound moist with mupirocin will speed healing while also protecting from infection  Would like to start compression stockings for rash and lower extremity edema but will wait to hear from [place] as surgical site not completely healed yet.  mupirocin ointment (BACTROBAN) 2 % - B/L Foot  Return in about 2 weeks (around 09/04/2020) for rash.  IDonzetta Kohut, CMA, am acting as scribe for Forest Gleason, MD .  Documentation: I have reviewed the above documentation for accuracy and completeness, and I agree with the above.  Forest Gleason, MD

## 2020-08-21 NOTE — Patient Instructions (Addendum)
Recommend daily broad spectrum sunscreen SPF 30+ to sun-exposed areas, reapply every 2 hours as needed. Call for new or changing lesions.   Recommend Gold Bond Rapid Relief Anti-Itch cream up to 3 times per day to areas that are itchy.

## 2020-08-21 NOTE — Telephone Encounter (Signed)
Please call patient and let her know I reviewed all of her labs which looked fine.   However, the test that I was thinking of to check her heart has not been done.  I would recommend adding a BNP test (heart test) to be sure that her heart is pumping well and not causing her leg swelling.   We can send the order to the lab to go ahead and have that done or send a note to her primary care, whichever she prefers.  Thanks

## 2020-08-21 NOTE — Telephone Encounter (Signed)
Perfect. Thank you!

## 2020-08-26 ENCOUNTER — Telehealth: Payer: Self-pay

## 2020-08-26 NOTE — Telephone Encounter (Signed)
Please advise patient we can switch her to a cream version of the medicine if she prefers, but it is not as strong as the ointment version. If she prefers cream, I would suggest increasing strength to the clobetasol 0.05% cream to have a better effect but it can be a bit more expensive.  We did not discuss sending in prednisone for her and I do not recommend it because of the risk of side effects and worsening of the rash when stopping the prednisone. The topical steroid is similar to prednisone in that it decrease the immune system, but it does it more locally at the skin instead of in the whole body.   The topicals typically work very well when used twice a day. The clobetasol is stronger than the triamcinolone so may give her faster relief. She can also use the goldbond rapid relief anti-itch for symptomatic relief while the prescription works to reduce the inflammation.

## 2020-08-26 NOTE — Telephone Encounter (Signed)
Patient called and left message this morning regarding medications. She states the Triamcinolone Ointment is so greasy it is ruining her clothes. Also patient states she was told a round of Prednisone would be sent in. Please advise.

## 2020-08-27 MED ORDER — CLOBETASOL PROP EMOLLIENT BASE 0.05 % EX CREA: 1.0000 "application " | TOPICAL_CREAM | Freq: Two times a day (BID) | CUTANEOUS | 0 refills | Status: DC

## 2020-08-27 NOTE — Telephone Encounter (Signed)
Prescription sent in. Patient has been advised of everything per Dr. Laurence Ferrari.

## 2020-08-30 ENCOUNTER — Other Ambulatory Visit: Payer: Self-pay

## 2020-08-30 ENCOUNTER — Ambulatory Visit
Admission: RE | Admit: 2020-08-30 | Discharge: 2020-08-30 | Disposition: A | Discharge status: Home / Self Care | Payer: BC Managed Care – PPO | Source: Ambulatory Visit | Attending: Pain Medicine | Admitting: Pain Medicine

## 2020-08-30 DIAGNOSIS — G8929 Other chronic pain: Secondary | ICD-10-CM

## 2020-09-01 ENCOUNTER — Encounter: Payer: Self-pay | Admitting: Cardiology

## 2020-09-01 ENCOUNTER — Ambulatory Visit (INDEPENDENT_AMBULATORY_CARE_PROVIDER_SITE_OTHER): Payer: BC Managed Care – PPO | Admitting: Cardiology

## 2020-09-01 ENCOUNTER — Other Ambulatory Visit: Payer: Self-pay

## 2020-09-01 VITALS — BP 110/70 | HR 97 | Ht 61.0 in | Wt 235.5 lb

## 2020-09-01 DIAGNOSIS — I8393 Asymptomatic varicose veins of bilateral lower extremities: Secondary | ICD-10-CM | POA: Diagnosis not present

## 2020-09-01 DIAGNOSIS — I10 Essential (primary) hypertension: Secondary | ICD-10-CM | POA: Diagnosis not present

## 2020-09-01 DIAGNOSIS — F17200 Nicotine dependence, unspecified, uncomplicated: Secondary | ICD-10-CM

## 2020-09-01 DIAGNOSIS — R600 Localized edema: Secondary | ICD-10-CM | POA: Diagnosis not present

## 2020-09-01 MED ORDER — TORSEMIDE 10 MG PO TABS: 10.0000 mg | ORAL_TABLET | Freq: Every day | ORAL | 3 refills | Status: DC

## 2020-09-01 NOTE — Patient Instructions (Addendum)
Medication Instructions:  Your physician has recommended you make the following change in your medication:  1- STOP Furosemide. 2- START Torsemide 10 mg by mouth once a day.  *If you need a refill on your cardiac medications before your next appointment, please call your pharmacy*  Lab Work: Your physician recommends that you return for lab work in: 2 weeks (on or around November 8) for BMET. - Please go to the [facility]. You will check in at the front desk to the right as you walk into the atrium. Valet Parking is offered if needed. - No appointment needed. You may go any day between 7 am and 6 pm.  If you have labs (blood work) drawn today and your tests are completely normal, you will receive your results only by: Marland Kitchen MyChart Message (if you have MyChart) OR . A paper copy in the mail If you have any lab test that is abnormal or we need to change your treatment, we will call you to review the results.  Testing/Procedures: 1- Your physician has requested that you have an echocardiogram. Echocardiography is a painless test that uses sound waves to create images of your heart. It provides your doctor with information about the size and shape of your heart and how well your heart's chambers and valves are working. This procedure takes approximately one hour. There are no restrictions for this procedure. You may get an IV, if needed, to receive an ultrasound enhancing agent through to better visualize your heart.    2- Your physician has requested that you have a lower extremity venus doppler- During this test, ultrasound is used to evaluate arterial blood flow in the legs. Allow approximately one hour for this exam.    Follow-Up:  You have been referred to [facility]. Someone should contact you to schedule. However, if you do not hear anything in the next 1-2 weeks, please call them. Phone number is under referral section of this paperwork.    At [facility], you  and your health needs are our priority.  As part of our continuing mission to provide you with exceptional heart care, we have created designated Provider Care Teams.  These Care Teams include your primary Cardiologist (physician) and Advanced Practice Providers (APPs -  Physician Assistants and Nurse Practitioners) who all work together to provide you with the care you need, when you need it.  We recommend signing up for the patient portal called "MyChart".  Sign up information is provided on this After Visit Summary.  MyChart is used to connect with patients for Virtual Visits (Telemedicine).  Patients are able to view lab/test results, encounter notes, upcoming appointments, etc.  Non-urgent messages can be sent to your provider as well.   To learn more about what you can do with MyChart, go to [email or web address].    Your next appointment:   After testing.  The format for your next appointment:   In Person  Provider:   You may see Kate Sable, MD or one of the following Advanced Practice Providers on your designated Care Team:    Murray Hodgkins, NP  Christell Faith, PA-C  Marrianne Mood, PA-C  Cadence Kathlen Mody, [place]    Echocardiogram An echocardiogram is a procedure that uses painless sound waves (ultrasound) to produce an image of the heart. Images from an echocardiogram can provide important information about:  Signs of coronary artery disease (CAD).  Aneurysm detection. An aneurysm is a weak or damaged part of an artery  wall that bulges out from the normal force of blood pumping through the body.  Heart size and shape. Changes in the size or shape of the heart can be associated with certain conditions, including heart failure, aneurysm, and CAD.  Heart muscle function.  Heart valve function.  Signs of a past heart attack.  Fluid buildup around the heart.  Thickening of the heart muscle.  A tumor or infectious growth around the heart valves. Tell a health care  provider about:  Any allergies you have.  All medicines you are taking, including vitamins, herbs, eye drops, creams, and over-the-counter medicines.  Any blood disorders you have.  Any surgeries you have had.  Any medical conditions you have.  Whether you are pregnant or may be pregnant. What are the risks? Generally, this is a safe procedure. However, problems may occur, including:  Allergic reaction to dye (contrast) that may be used during the procedure. What happens before the procedure? No specific preparation is needed. You may eat and drink normally. What happens during the procedure?   An IV tube may be inserted into one of your veins.  You may receive contrast through this tube. A contrast is an injection that improves the quality of the pictures from your heart.  A gel will be applied to your chest.  A wand-like tool (transducer) will be moved over your chest. The gel will help to transmit the sound waves from the transducer.  The sound waves will harmlessly bounce off of your heart to allow the heart images to be captured in real-time motion. The images will be recorded on a computer. The procedure may vary among health care providers and hospitals. What happens after the procedure?  You may return to your normal, everyday life, including diet, activities, and medicines, unless your health care provider tells you not to do that. Summary  An echocardiogram is a procedure that uses painless sound waves (ultrasound) to produce an image of the heart.  Images from an echocardiogram can provide important information about the size and shape of your heart, heart muscle function, heart valve function, and fluid buildup around your heart.  You do not need to do anything to prepare before this procedure. You may eat and drink normally.  After the echocardiogram is completed, you may return to your normal, everyday life, unless your health care provider tells you not to do  that. This information is not intended to replace advice given to you by your health care provider. Make sure you discuss any questions you have with your health care provider. Document Revised: 02/15/2019 Document Reviewed: 11/27/2016 Elsevier Patient Education  [place].

## 2020-09-01 NOTE — Progress Notes (Signed)
Cardiology Office Note:    Date:  09/01/2020   ID:  [name], DOB 09-Jul-1966, MRN [id number]  PCP:  Lorelee Market, MD  [facility] Cardiologist:  Kate Sable, MD  [place] Electrophysiologist:  None   Referring MD: Martin Majestic, *   Chief Complaint  Patient presents with  . OTHER    Cardiomegaly/HTN no complaints today. Meds reviewed verbally with pt.   [name] is a 54 y.o. female who is being seen today for the evaluation of edema, hypertension at the request of Martin Majestic, *.  History of Present Illness:    [name] is a 54 y.o. female with a hx of hypertension, diabetes, COPD, current smoker x30+ years records veins, who presents due to edema.  Patient states having worsening lower extremity edema over the past 2 months.  She states having a history of varicose veins.  Initially was on amlodipine which was stopped 1 month ago.  Her symptoms of edema persisted.  Was started on Lasix 20 mg daily, but has not had any effects from this.  Denies chest pain, shortness of breath, palpitations.  Denies any history of heart disease.  Take blood pressure medications as prescribed, blood pressure typically normal.  Will like to see vascular specialist due to history of varicose veins .   Past Medical History:  Diagnosis Date  . Arthritis   . COPD (chronic obstructive pulmonary disease) ([place])    denies  . COVID-19 10/23/2019   Asymptomatic  . Environmental and seasonal allergies   . Fibromyalgia   . History of colon polyps 2017  . Hypertension   . Pneumonia    aspiration PNA  . Wound infection    right foot    Past Surgical History:  Procedure Laterality Date  . APPLICATION OF WOUND VAC Right 05/20/2020   Procedure: APPLICATION OF WOUND VAC TO RIGHT FOOT;  Surgeon: Erle Crocker, MD;  Location: [place];  Service: Orthopedics;  Laterality: Right;  . COLONOSCOPY WITH PROPOFOL N/A 09/03/2016   Procedure:  COLONOSCOPY WITH PROPOFOL;  Surgeon: [name], MD;  Location: [facility];  Service: Endoscopy;  Laterality: N/A;  . FRACTURE SURGERY     left foot   . INCISION AND DRAINAGE Right 05/20/2020   Procedure: RIGHT FOOT INCISION AND DRAINAGE;  Surgeon: Erle Crocker, MD;  Location: [place];  Service: Orthopedics;  Laterality: Right;  LENGTH OF SURGERY: 1 HOUR  . KNEE CARTILAGE SURGERY  2015   left  . LAPAROSCOPIC SALPINGOOPHERECTOMY    . left hand     1 for ganglion cyst and next yr replace "joints" in left hand  . PLANTAR FASCIA SURGERY     left foot  . SHOULDER INJECTION Right 05/17/2016   Procedure: SHOULDER INJECTION;  Surgeon: Dorna Leitz, MD;  Location: [place];  Service: Orthopedics;  Laterality: Right;  . SHOULDER SURGERY     x 2 on right shoulder  . TONSILLECTOMY    . TOTAL KNEE ARTHROPLASTY Left 05/17/2016   Procedure: TOTAL KNEE ARTHROPLASTY;  Surgeon: Dorna Leitz, MD;  Location: [place];  Service: Orthopedics;  Laterality: Left;  . TOTAL KNEE ARTHROPLASTY Right 11/23/2019   Procedure: TOTAL KNEE ARTHROPLASTY;  Surgeon: Dorna Leitz, MD;  Location: [facility];  Service: Orthopedics;  Laterality: Right;  . TOTAL SHOULDER ARTHROPLASTY Left 05/09/2015   Procedure: TOTAL SHOULDER ARTHROPLASTY;  Surgeon: Dorna Leitz, MD;  Location: [place];  Service: Orthopedics;  Laterality: Left;  . TUBAL LIGATION  tubes removed stated patient    Current Medications: Current Meds  Medication Sig  . b complex vitamins tablet Take 1 tablet by mouth daily.  . cholecalciferol (VITAMIN D) 1000 units tablet Take 1,000 Units by mouth daily.   . fluticasone (FLONASE) 50 MCG/ACT nasal spray Place 1 spray into both nostrils daily.  Marland Kitchen gabapentin (NEURONTIN) 600 MG tablet Take 600 mg by mouth 3 (three) times daily.  . hydrOXYzine (ATARAX/VISTARIL) 25 MG tablet Take 25 mg by mouth 2 (two) times daily.  Marland Kitchen ibuprofen (ADVIL) 800 MG tablet Take 800 mg by mouth every 8 (eight) hours as needed for moderate pain.  Marland Kitchen  losartan (COZAAR) 100 MG tablet Take 100 mg by mouth daily.  . Multiple Vitamin (MULTIVITAMIN WITH MINERALS) TABS tablet Take 1 tablet by mouth daily. 50 +  . NARCAN 4 MG/0.1ML LIQD nasal spray kit SMARTSIG:1 Both Nares Daily PRN  . oxyCODONE-acetaminophen (PERCOCET) 10-325 MG tablet SMARTSIG:1 By Mouth 4-5 Times Daily  . SPIRIVA RESPIMAT 2.5 MCG/ACT AERS SMARTSIG:2 Puff(s) Via Inhaler Daily  . traZODone (DESYREL) 50 MG tablet Take 1 tablet (50 mg total) by mouth at bedtime as needed for sleep.  Marland Kitchen venlafaxine XR (EFFEXOR-XR) 150 MG 24 hr capsule Take 1 capsule (150 mg total) by mouth daily.  . VENTOLIN HFA 108 (90 Base) MCG/ACT inhaler Inhale 2 puffs into the lungs every 4 (four) hours as needed for wheezing or shortness of breath.   . [DISCONTINUED] amLODipine (NORVASC) 10 MG tablet Take 10 mg by mouth daily.   . [DISCONTINUED] furosemide (LASIX) 20 MG tablet Take by mouth.     Allergies:   Augmentin [amoxicillin-pot clavulanate], Bee venom, Diclofenac sodium, Other, and Sulfa antibiotics   Social History   Socioeconomic History  . Marital status: Single    Spouse name: Not on file  . Number of children: Not on file  . Years of education: Not on file  . Highest education level: Not on file  Occupational History  . Not on file  Tobacco Use  . Smoking status: Current Every Day Smoker    Packs/day: 1.00    Years: 30.00    Pack years: 30.00    Types: Cigarettes  . Smokeless tobacco: Never Used  Vaping Use  . Vaping Use: Never used  Substance and Sexual Activity  . Alcohol use: Not Currently  . Drug use: No  . Sexual activity: Yes    Birth control/protection: None  Other Topics Concern  . Not on file  Social History Narrative  . Not on file   Social Determinants of Health   Financial Resource Strain:   . Difficulty of Paying Living Expenses: Not on file  Food Insecurity:   . Worried About Charity fundraiser in the Last Year: Not on file  . Ran Out of Food in the Last  Year: Not on file  Transportation Needs:   . Lack of Transportation (Medical): Not on file  . Lack of Transportation (Non-Medical): Not on file  Physical Activity:   . Days of Exercise per Week: Not on file  . Minutes of Exercise per Session: Not on file  Stress:   . Feeling of Stress : Not on file  Social Connections:   . Frequency of Communication with Friends and Family: Not on file  . Frequency of Social Gatherings with Friends and Family: Not on file  . Attends Religious Services: Not on file  . Active Member of Clubs or Organizations: Not on file  . Attends  Club or Organization Meetings: Not on file  . Marital Status: Not on file     Family History: The patient's family history includes Heart attack in her father; Stroke in her father.  ROS:   Please see the history of present illness.     All other systems reviewed and are negative.  EKGs/Labs/Other Studies Reviewed:    The following studies were reviewed today:   EKG:  EKG is  ordered today.  The ekg ordered today demonstrates sinus rhythm, normal ECG.  Recent Labs: 07/21/2020: ALT 22; BUN 15; Creatinine, Ser 0.79; Hemoglobin 12.2; Platelets 415; Potassium 4.1; Sodium 139  Recent Lipid Panel No results found for: CHOL, TRIG, HDL, CHOLHDL, VLDL, LDLCALC, LDLDIRECT   Risk Assessment/Calculations:      Physical Exam:    VS:  BP 110/70 (BP Location: Right Arm, Patient Position: Sitting, Cuff Size: Normal)   Pulse 97   Ht 5' 1"  (1.549 m)   Wt 235 lb 8 oz (106.8 kg)   SpO2 97%   BMI 44.50 kg/m     Wt Readings from Last 3 Encounters:  09/01/20 235 lb 8 oz (106.8 kg)  06/30/20 241 lb (109.3 kg)  05/20/20 230 lb (104.3 kg)     GEN:  Well nourished, well developed in no acute distress HEENT: Normal NECK: No JVD; No carotid bruits LYMPHATICS: No lymphadenopathy CARDIAC: RRR, no murmurs, rubs, gallops RESPIRATORY:  Clear to auscultation without rales, wheezing or rhonchi  ABDOMEN: Soft, non-tender,  distended MUSCULOSKELETAL:  1+ edema; No deformity  SKIN: Warm and dry NEUROLOGIC:  Alert and oriented x 3 PSYCHIATRIC:  Normal affect   ASSESSMENT:    1. Bilateral lower extremity edema   2. Varicose veins of both lower extremities, unspecified whether complicated   3. Primary hypertension   4. Smoking   5. Morbid obesity ([place])    PLAN:    In order of problems listed above:  1. Patient with bilateral lower extremity edema.  History of varicose veins.  She may have venous insufficiency, also diastolic dysfunction due to hypertension and morbid obesity.  Get echocardiogram to evaluate systolic and diastolic function.  Stop Lasix as this has not worked in the past, start torsemide 20 mg daily due to better absorption.  Check BMP. 2. Varicose veins noted in the lower extremities, GERD lower extremity ultrasound with reflux.  Refer patient to vein clinic.  Patient advised to keep legs raised when in a seated position. 3. History of hypertension, BP controlled.  Continue losartan, HCTZ. 4. Current smoker, smoking cessation advised. 5. Patient is morbidly obese, low-calorie diet, weight loss advised.  Follow-up after echocardiogram.   Medication Adjustments/Labs and Tests Ordered: Current medicines are reviewed at length with the patient today.  Concerns regarding medicines are outlined above.  Orders Placed This Encounter  Procedures  . Ambulatory referral to Vascular Surgery  . EKG 12-Lead  . ECHOCARDIOGRAM COMPLETE  . VAS [place] LOWER EXTREMITY VENOUS REFLUX   Meds ordered this encounter  Medications  . torsemide (DEMADEX) 10 MG tablet    Sig: Take 1 tablet (10 mg total) by mouth daily.    Dispense:  30 tablet    Refill:  3    Patient Instructions  Medication Instructions:  Your physician has recommended you make the following change in your medication:  1- STOP Furosemide. 2- START Torsemide 10 mg by mouth once a day.  *If you need a refill on your cardiac medications  before your next appointment, please call your pharmacy*  Lab Work: Your physician recommends that you return for lab work in: 2 weeks (on or around November 8) for BMET. - Please go to the [facility]. You will check in at the front desk to the right as you walk into the atrium. Valet Parking is offered if needed. - No appointment needed. You may go any day between 7 am and 6 pm.  If you have labs (blood work) drawn today and your tests are completely normal, you will receive your results only by: Marland Kitchen MyChart Message (if you have MyChart) OR . A paper copy in the mail If you have any lab test that is abnormal or we need to change your treatment, we will call you to review the results.  Testing/Procedures: 1- Your physician has requested that you have an echocardiogram. Echocardiography is a painless test that uses sound waves to create images of your heart. It provides your doctor with information about the size and shape of your heart and how well your heart's chambers and valves are working. This procedure takes approximately one hour. There are no restrictions for this procedure. You may get an IV, if needed, to receive an ultrasound enhancing agent through to better visualize your heart.    2- Your physician has requested that you have a lower extremity venus doppler- During this test, ultrasound is used to evaluate arterial blood flow in the legs. Allow approximately one hour for this exam.    Follow-Up:  You have been referred to [facility]. Someone should contact you to schedule. However, if you do not hear anything in the next 1-2 weeks, please call them. Phone number is under referral section of this paperwork.    At [facility], you and your health needs are our priority.  As part of our continuing mission to provide you with exceptional heart care, we have created designated Provider Care Teams.  These Care Teams include your primary Cardiologist  (physician) and Advanced Practice Providers (APPs -  Physician Assistants and Nurse Practitioners) who all work together to provide you with the care you need, when you need it.  We recommend signing up for the patient portal called "MyChart".  Sign up information is provided on this After Visit Summary.  MyChart is used to connect with patients for Virtual Visits (Telemedicine).  Patients are able to view lab/test results, encounter notes, upcoming appointments, etc.  Non-urgent messages can be sent to your provider as well.   To learn more about what you can do with MyChart, go to [email or web address].    Your next appointment:   After testing.  The format for your next appointment:   In Person  Provider:   You may see Kate Sable, MD or one of the following Advanced Practice Providers on your designated Care Team:    Murray Hodgkins, NP  Christell Faith, PA-C  Marrianne Mood, PA-C  Cadence Kathlen Mody, [place]    Echocardiogram An echocardiogram is a procedure that uses painless sound waves (ultrasound) to produce an image of the heart. Images from an echocardiogram can provide important information about:  Signs of coronary artery disease (CAD).  Aneurysm detection. An aneurysm is a weak or damaged part of an artery wall that bulges out from the normal force of blood pumping through the body.  Heart size and shape. Changes in the size or shape of the heart can be associated with certain conditions, including heart failure, aneurysm, and CAD.  Heart muscle function.  Heart valve function.  Signs of a past heart attack.  Fluid buildup around the heart.  Thickening of the heart muscle.  A tumor or infectious growth around the heart valves. Tell a health care provider about:  Any allergies you have.  All medicines you are taking, including vitamins, herbs, eye drops, creams, and over-the-counter medicines.  Any blood disorders you have.  Any surgeries you have  had.  Any medical conditions you have.  Whether you are pregnant or may be pregnant. What are the risks? Generally, this is a safe procedure. However, problems may occur, including:  Allergic reaction to dye (contrast) that may be used during the procedure. What happens before the procedure? No specific preparation is needed. You may eat and drink normally. What happens during the procedure?   An IV tube may be inserted into one of your veins.  You may receive contrast through this tube. A contrast is an injection that improves the quality of the pictures from your heart.  A gel will be applied to your chest.  A wand-like tool (transducer) will be moved over your chest. The gel will help to transmit the sound waves from the transducer.  The sound waves will harmlessly bounce off of your heart to allow the heart images to be captured in real-time motion. The images will be recorded on a computer. The procedure may vary among health care providers and hospitals. What happens after the procedure?  You may return to your normal, everyday life, including diet, activities, and medicines, unless your health care provider tells you not to do that. Summary  An echocardiogram is a procedure that uses painless sound waves (ultrasound) to produce an image of the heart.  Images from an echocardiogram can provide important information about the size and shape of your heart, heart muscle function, heart valve function, and fluid buildup around your heart.  You do not need to do anything to prepare before this procedure. You may eat and drink normally.  After the echocardiogram is completed, you may return to your normal, everyday life, unless your health care provider tells you not to do that. This information is not intended to replace advice given to you by your health care provider. Make sure you discuss any questions you have with your health care provider. Document Revised: 02/15/2019  Document Reviewed: 11/27/2016 Elsevier Patient Education  2020 [place], Kate Sable, MD  09/01/2020 12:36 PM    [place]

## 2020-09-02 ENCOUNTER — Ambulatory Visit: Payer: BC Managed Care – PPO | Admitting: Dermatology

## 2020-09-04 ENCOUNTER — Ambulatory Visit (HOSPITAL_COMMUNITY): Payer: BC Managed Care – PPO

## 2020-09-17 ENCOUNTER — Ambulatory Visit (INDEPENDENT_AMBULATORY_CARE_PROVIDER_SITE_OTHER): Payer: BC Managed Care – PPO

## 2020-09-17 ENCOUNTER — Other Ambulatory Visit: Payer: Self-pay

## 2020-09-17 DIAGNOSIS — R600 Localized edema: Secondary | ICD-10-CM

## 2020-09-17 LAB — ECHOCARDIOGRAM COMPLETE
AR max vel: 2.92 cm2
AV Area VTI: 2.71 cm2
AV Area mean vel: 3 cm2
AV Mean grad: 9 mmHg
AV Peak grad: 15.5 mmHg
Ao pk vel: 1.97 m/s
Area-P 1/2: 4.54 cm2
Calc EF: 64.1 %
S' Lateral: 2.6 cm
Single Plane A2C EF: 63.8 %
Single Plane A4C EF: 66.9 %

## 2020-09-19 ENCOUNTER — Other Ambulatory Visit: Payer: Self-pay

## 2020-09-19 ENCOUNTER — Ambulatory Visit (HOSPITAL_COMMUNITY)
Admission: RE | Admit: 2020-09-19 | Discharge: 2020-09-19 | Disposition: A | Discharge status: Home / Self Care | Payer: BC Managed Care – PPO | Source: Ambulatory Visit | Attending: Cardiology | Admitting: Cardiology

## 2020-09-19 DIAGNOSIS — R600 Localized edema: Secondary | ICD-10-CM | POA: Diagnosis not present

## 2020-09-19 DIAGNOSIS — I8393 Asymptomatic varicose veins of bilateral lower extremities: Secondary | ICD-10-CM

## 2020-09-23 ENCOUNTER — Ambulatory Visit (INDEPENDENT_AMBULATORY_CARE_PROVIDER_SITE_OTHER): Payer: BC Managed Care – PPO | Admitting: Obstetrics and Gynecology

## 2020-09-23 ENCOUNTER — Other Ambulatory Visit: Payer: Self-pay

## 2020-09-23 ENCOUNTER — Encounter: Payer: Self-pay | Admitting: Obstetrics and Gynecology

## 2020-09-23 VITALS — Wt 230.0 lb

## 2020-09-23 DIAGNOSIS — Z6841 Body Mass Index (BMI) 40.0 and over, adult: Secondary | ICD-10-CM | POA: Diagnosis not present

## 2020-09-23 MED ORDER — PHENTERMINE HCL 37.5 MG PO TABS: 37.5000 mg | ORAL_TABLET | Freq: Every day | ORAL | 0 refills | Status: DC

## 2020-09-23 NOTE — Progress Notes (Signed)
F/U medication, phone

## 2020-09-23 NOTE — Progress Notes (Signed)
I connected with [name]  on 09/23/2020 at  1:50 PM EST by telephone and verified that I am speaking with the correct person using two identifiers.   I discussed the limitations, risks, security and privacy concerns of performing an evaluation and management service by telephone and the availability of in person appointments. I also discussed with the patient that there may be a patient responsible charge related to this service. The patient expressed understanding and agreed to proceed.  The patient was at home I spoke with the patient from my workstation phone The names of people involved in this encounter were: [name] , and [name]   Gynecology Office Visit  Chief Complaint:  Chief Complaint  Patient presents with  . Follow-up    Medication    History of Present Illness: Patientis a 54 y.o. [name] female, who presents for the evaluation of the desire to lose weight. She was unable to have [place] authorized so did not start. The patient has been imobolized a good bit earlier this year secondary to foot surgery.  She is awaiting clearance to proceed with The patient is trying to loose an additional 18lbs in order to qualify for another orthopedic intervention prior to the end of the year.  She recently underwent cardiology and vascular evaluation for lower extremity edema with normal echo, some venous insuficiency.  Has previously been on phentermine and tolerated well.    Review of Systems: 10 point review of systems negative unless otherwise noted in HPI  Past Medical History:  Past Medical History:  Diagnosis Date  . Arthritis   . COPD (chronic obstructive pulmonary disease) ([place])    denies  . COVID-19 10/23/2019   Asymptomatic  . Environmental and seasonal allergies   . Fibromyalgia   . History of colon polyps 2017  . Hypertension   . Pneumonia    aspiration PNA  . Wound infection    right foot    Past Surgical History:  Past Surgical  History:  Procedure Laterality Date  . APPLICATION OF WOUND VAC Right 05/20/2020   Procedure: APPLICATION OF WOUND VAC TO RIGHT FOOT;  Surgeon: Erle Crocker, MD;  Location: [facility];  Service: Orthopedics;  Laterality: Right;  . COLONOSCOPY WITH PROPOFOL N/A 09/03/2016   Procedure: COLONOSCOPY WITH PROPOFOL;  Surgeon: [name], MD;  Location: [facility];  Service: Endoscopy;  Laterality: N/A;  . FRACTURE SURGERY     left foot   . INCISION AND DRAINAGE Right 05/20/2020   Procedure: RIGHT FOOT INCISION AND DRAINAGE;  Surgeon: Erle Crocker, MD;  Location: [facility];  Service: Orthopedics;  Laterality: Right;  LENGTH OF SURGERY: 1 HOUR  . KNEE CARTILAGE SURGERY  2015   left  . LAPAROSCOPIC SALPINGOOPHERECTOMY    . left hand     1 for ganglion cyst and next yr replace "joints" in left hand  . PLANTAR FASCIA SURGERY     left foot  . SHOULDER INJECTION Right 05/17/2016   Procedure: SHOULDER INJECTION;  Surgeon: Dorna Leitz, MD;  Location: [facility];  Service: Orthopedics;  Laterality: Right;  . SHOULDER SURGERY     x 2 on right shoulder  . TONSILLECTOMY    . TOTAL KNEE ARTHROPLASTY Left 05/17/2016   Procedure: TOTAL KNEE ARTHROPLASTY;  Surgeon: Dorna Leitz, MD;  Location: [facility];  Service: Orthopedics;  Laterality: Left;  . TOTAL KNEE ARTHROPLASTY Right 11/23/2019   Procedure: TOTAL KNEE ARTHROPLASTY;  Surgeon: Dorna Leitz, MD;  [facility];  Service: Orthopedics;  Laterality: Right;  . TOTAL SHOULDER ARTHROPLASTY Left 05/09/2015   Procedure: TOTAL SHOULDER ARTHROPLASTY;  Surgeon: Dorna Leitz, MD;  Location: [facility];  Service: Orthopedics;  Laterality: Left;  . TUBAL LIGATION     tubes removed stated patient    Gynecologic History: No LMP recorded. Patient is postmenopausal.  Obstetric History: G0P0000  Family History:  Family History  Problem Relation Age of Onset  . Heart attack Father   . Stroke Father     Social History:  Social History   Socioeconomic History  .  Marital status: Single    Spouse name: Not on file  . Number of children: Not on file  . Years of education: Not on file  . Highest education level: Not on file  Occupational History  . Not on file  Tobacco Use  . Smoking status: Current Every Day Smoker    Packs/day: 1.00    Years: 30.00    Pack years: 30.00    Types: Cigarettes  . Smokeless tobacco: Never Used  Vaping Use  . Vaping Use: Never used  Substance and Sexual Activity  . Alcohol use: Not Currently  . Drug use: No  . Sexual activity: Yes    Birth control/protection: None  Other Topics Concern  . Not on file  Social History Narrative  . Not on file   Social Determinants of Health   Financial Resource Strain:   . Difficulty of Paying Living Expenses: Not on file  Food Insecurity:   . Worried About Charity fundraiser in the Last Year: Not on file  . Ran Out of Food in the Last Year: Not on file  Transportation Needs:   . Lack of Transportation (Medical): Not on file  . Lack of Transportation (Non-Medical): Not on file  Physical Activity:   . Days of Exercise per Week: Not on file  . Minutes of Exercise per Session: Not on file  Stress:   . Feeling of Stress : Not on file  Social Connections:   . Frequency of Communication with Friends and Family: Not on file  . Frequency of Social Gatherings with Friends and Family: Not on file  . Attends Religious Services: Not on file  . Active Member of Clubs or Organizations: Not on file  . Attends Archivist Meetings: Not on file  . Marital Status: Not on file  Intimate Partner Violence:   . Fear of Current or Ex-Partner: Not on file  . Emotionally Abused: Not on file  . Physically Abused: Not on file  . Sexually Abused: Not on file    Allergies:  Allergies  Allergen Reactions  . Augmentin [Amoxicillin-Pot Clavulanate] Anaphylaxis    Did it involve swelling of the face/tongue/throat, SOB, or low BP? Yes Did it involve sudden or severe rash/hives,  skin peeling, or any reaction on the inside of your mouth or nose? No Did you need to seek medical attention at a hospital or doctor's office? Yes When did it last happen?15 years ago If all above answers are "NO", may proceed with cephalosporin use.   . Bee Venom Hives and Shortness Of Breath    Treats with benadryl  . Diclofenac Sodium Itching and Swelling    Can tolerate Voltaren  Cannot use PENNSAID- topical gel  . Other Hives and Shortness Of Breath    Reaction to spider bites (treats with benadryl)  . Sulfa Antibiotics Anaphylaxis and Other (See Comments)    Childhood allergic  reaction Sulfonamides    Medications: Prior to Admission medications   Medication Sig Start Date End Date Taking? Authorizing Provider  b complex vitamins tablet Take 1 tablet by mouth daily.   Yes [provider]  cholecalciferol (VITAMIN D) 1000 units tablet Take 1,000 Units by mouth daily.    Yes [provider]  fluticasone (FLONASE) 50 MCG/ACT nasal spray Place 1 spray into both nostrils daily.   Yes [provider]  gabapentin (NEURONTIN) 600 MG tablet Take 600 mg by mouth 3 (three) times daily. 07/26/20  Yes [provider]  hydrOXYzine (ATARAX/VISTARIL) 25 MG tablet Take 25 mg by mouth 2 (two) times daily. 08/12/20  Yes [provider]  ibuprofen (ADVIL) 800 MG tablet Take 800 mg by mouth every 8 (eight) hours as needed for moderate pain.   Yes [provider]  losartan (COZAAR) 100 MG tablet Take 100 mg by mouth daily.   Yes [provider]  Multiple Vitamin (MULTIVITAMIN WITH MINERALS) TABS tablet Take 1 tablet by mouth daily. 50 +   Yes [provider]  NARCAN 4 MG/0.1ML LIQD nasal spray kit SMARTSIG:1 Both Nares Daily PRN 05/21/20  Yes [provider]  oxyCODONE-acetaminophen (PERCOCET) 10-325 MG tablet SMARTSIG:1 By Mouth 4-5 Times Daily 08/10/20  Yes [provider]  SPIRIVA RESPIMAT 2.5 MCG/ACT AERS  SMARTSIG:2 Puff(s) Via Inhaler Daily 08/13/20  Yes [provider]  torsemide (DEMADEX) 10 MG tablet Take 1 tablet (10 mg total) by mouth daily. 09/01/20  Yes Kate Sable, MD  traZODone (DESYREL) 50 MG tablet Take 1 tablet (50 mg total) by mouth at bedtime as needed for sleep. 06/30/20  Yes April Mood, MD  venlafaxine XR (EFFEXOR-XR) 150 MG 24 hr capsule Take 1 capsule (150 mg total) by mouth daily. 06/30/20  Yes April Mood, MD  VENTOLIN HFA 108 [phone] Base) MCG/ACT inhaler Inhale 2 puffs into the lungs every 4 (four) hours as needed for wheezing or shortness of breath.  03/19/20  Yes [provider]    Physical Exam There were no vitals taken for this visit. Wt Readings from Last 3 Encounters:  09/25/20 229 lb (103.9 kg)  09/23/20 230 lb (104.3 kg)  09/01/20 235 lb 8 oz (106.8 kg)  Body mass index is 43.46 kg/m.  No physical exam as this was a remote telephone visit to promote social distancing during the current COVID-19 Pandemic  Assessment: 54 y.o. G0P0000 medical weight loss follow up Plan: Problem List Items Addressed This Visit    None    Visit Diagnoses    Class 3 severe obesity without serious comorbidity with body mass index (BMI) of 45.0 to 49.9 in adult, unspecified obesity type ([place])    -  Primary   Relevant Medications   phentermine (ADIPEX-P) 37.5 MG tablet      1) 1500 Calorie ADA Diet  2) Patient education given regarding appropriate lifestyle changes for weight loss including: regular physical activity, healthy coping strategies, caloric restriction and healthy eating patterns.  3) Patient will be started on weight loss medication. The risks and benefits and side effects of medication, such as Adipex (Phenteramine) ,  Tenuate (Diethylproprion), Belviq (lorcarsin), Contrave (buproprion/naltrexone), Qsymia (phentermine/topiramate), and Saxenda (liraglutide) is discussed. The pros and cons of suppressing appetite and boosting metabolism  is discussed. Risks of tolerence and addiction is discussed for selected agents discussed. Use of medicine will ne short term, such as 3-4 months at a time followed by a period of time off of the medicine  to avoid these risks and side effects for Adipex, Qsymia, and Tenuate discussed. Pt to call with any negative side effects and agrees to keep follow up appts.  4) Was unable to pick up Saxenda restart phentermine has taken in the past and tolerated well.  Has to loose weight for orthopedic surgery   5) Telephone [phone]  6)  Return for follow up medical weight loss.    AMalachy Mood MD, [facility], [facility] 09/23/2020, 1:42 PM

## 2020-09-25 ENCOUNTER — Encounter: Payer: Self-pay | Admitting: Cardiology

## 2020-09-25 ENCOUNTER — Other Ambulatory Visit: Payer: Self-pay

## 2020-09-25 ENCOUNTER — Ambulatory Visit (INDEPENDENT_AMBULATORY_CARE_PROVIDER_SITE_OTHER): Payer: BC Managed Care – PPO | Admitting: Cardiology

## 2020-09-25 VITALS — BP 130/78 | HR 95 | Ht 61.0 in | Wt 229.0 lb

## 2020-09-25 DIAGNOSIS — R600 Localized edema: Secondary | ICD-10-CM | POA: Diagnosis not present

## 2020-09-25 DIAGNOSIS — F17200 Nicotine dependence, unspecified, uncomplicated: Secondary | ICD-10-CM

## 2020-09-25 DIAGNOSIS — I10 Essential (primary) hypertension: Secondary | ICD-10-CM | POA: Diagnosis not present

## 2020-09-25 NOTE — Progress Notes (Signed)
Cardiology Office Note:    Date:  09/25/2020   ID:  [name], DOB 1966-06-22, MRN [id number]  PCP:  Martin Majestic, [place] Cardiologist:  Kate Sable, MD  [place] Electrophysiologist:  None   Referring MD: Lorelee Market, MD   Chief Complaint  Patient presents with  . OTHER    F/u echo and doppler no complaints today. Meds reviewed verbally with pt.    History of Present Illness:    [name] is a 54 y.o. female with a hx of hypertension, diabetes, COPD, current smoker x30+ years varicose veins, who presents for follow-up.  Last seen due to lower extremity edema, varicose veins also noted in the lower extremities.  Lower extremity ultrasound with reflux and echocardiogram was ordered.  Torsemide was started for diuresing and she has risk factors for diastolic dysfunction.  Lasix did not help in the past.  Has no concerns or complaints today.  Her swelling is much improved and resolved since starting torsemide.  She is working on quitting smoking, joint a quit smoking program at her job.    Past Medical History:  Diagnosis Date  . Arthritis   . COPD (chronic obstructive pulmonary disease) ([place])    denies  . COVID-19 10/23/2019   Asymptomatic  . Environmental and seasonal allergies   . Fibromyalgia   . History of colon polyps 2017  . Hypertension   . Pneumonia    aspiration PNA  . Wound infection    right foot    Past Surgical History:  Procedure Laterality Date  . APPLICATION OF WOUND VAC Right 05/20/2020   Procedure: APPLICATION OF WOUND VAC TO RIGHT FOOT;  Surgeon: Erle Crocker, MD;  Location: [facility];  Service: Orthopedics;  Laterality: Right;  . COLONOSCOPY WITH PROPOFOL N/A 09/03/2016   Procedure: COLONOSCOPY WITH PROPOFOL;  Surgeon: [name], MD;  Location: [facility];  Service: Endoscopy;  Laterality: N/A;  . FRACTURE SURGERY     left foot   . INCISION AND DRAINAGE Right 05/20/2020   Procedure: RIGHT  FOOT INCISION AND DRAINAGE;  Surgeon: Erle Crocker, MD;  Location: [facility];  Service: Orthopedics;  Laterality: Right;  LENGTH OF SURGERY: 1 HOUR  . KNEE CARTILAGE SURGERY  2015   left  . LAPAROSCOPIC SALPINGOOPHERECTOMY    . left hand     1 for ganglion cyst and next yr replace "joints" in left hand  . PLANTAR FASCIA SURGERY     left foot  . SHOULDER INJECTION Right 05/17/2016   Procedure: SHOULDER INJECTION;  Surgeon: Dorna Leitz, MD;  Location: [facility];  Service: Orthopedics;  Laterality: Right;  . SHOULDER SURGERY     x 2 on right shoulder  . TONSILLECTOMY    . TOTAL KNEE ARTHROPLASTY Left 05/17/2016   Procedure: TOTAL KNEE ARTHROPLASTY;  Surgeon: Dorna Leitz, MD;  Location: [facility];  Service: Orthopedics;  Laterality: Left;  . TOTAL KNEE ARTHROPLASTY Right 11/23/2019   Procedure: TOTAL KNEE ARTHROPLASTY;  Surgeon: Dorna Leitz, MD;  Location[facility];  Service: Orthopedics;  Laterality: Right;  . TOTAL SHOULDER ARTHROPLASTY Left 05/09/2015   Procedure: TOTAL SHOULDER ARTHROPLASTY;  Surgeon: Dorna Leitz, MD;  Location: [facility];  Service: Orthopedics;  Laterality: Left;  . TUBAL LIGATION     tubes removed stated patient    Current Medications: Current Meds  Medication Sig  . b complex vitamins tablet Take 1 tablet by mouth daily.  . cholecalciferol (VITAMIN D) 1000 units tablet Take  1,000 Units by mouth daily.   . fluticasone (FLONASE) 50 MCG/ACT nasal spray Place 1 spray into both nostrils daily.  Marland Kitchen gabapentin (NEURONTIN) 600 MG tablet Take 600 mg by mouth 3 (three) times daily.  . hydrOXYzine (ATARAX/VISTARIL) 25 MG tablet Take 25 mg by mouth 2 (two) times daily.  Marland Kitchen ibuprofen (ADVIL) 800 MG tablet Take 800 mg by mouth every 8 (eight) hours as needed for moderate pain.  Marland Kitchen losartan (COZAAR) 100 MG tablet Take 100 mg by mouth daily.  . Multiple Vitamin (MULTIVITAMIN WITH MINERALS) TABS tablet Take 1 tablet by mouth daily. 50 +  . NARCAN 4 MG/0.1ML LIQD nasal spray kit SMARTSIG:1 Both  Nares Daily PRN  . oxyCODONE-acetaminophen (PERCOCET) 10-325 MG tablet SMARTSIG:1 By Mouth 4-5 Times Daily  . phentermine (ADIPEX-P) 37.5 MG tablet Take 1 tablet (37.5 mg total) by mouth daily before breakfast.  . SPIRIVA RESPIMAT 2.5 MCG/ACT AERS SMARTSIG:2 Puff(s) Via Inhaler Daily  . torsemide (DEMADEX) 10 MG tablet Take 1 tablet (10 mg total) by mouth daily.  . traZODone (DESYREL) 50 MG tablet Take 1 tablet (50 mg total) by mouth at bedtime as needed for sleep.  Marland Kitchen venlafaxine XR (EFFEXOR-XR) 150 MG 24 hr capsule Take 1 capsule (150 mg total) by mouth daily.  . VENTOLIN HFA 108 (90 Base) MCG/ACT inhaler Inhale 2 puffs into the lungs every 4 (four) hours as needed for wheezing or shortness of breath.      Allergies:   Augmentin [amoxicillin-pot clavulanate], Bee venom, Diclofenac sodium, Other, and Sulfa antibiotics   Social History   Socioeconomic History  . Marital status: Single    Spouse name: Not on file  . Number of children: Not on file  . Years of education: Not on file  . Highest education level: Not on file  Occupational History  . Not on file  Tobacco Use  . Smoking status: Current Every Day Smoker    Packs/day: 1.00    Years: 30.00    Pack years: 30.00    Types: Cigarettes  . Smokeless tobacco: Never Used  Vaping Use  . Vaping Use: Never used  Substance and Sexual Activity  . Alcohol use: Not Currently  . Drug use: No  . Sexual activity: Yes    Birth control/protection: None  Other Topics Concern  . Not on file  Social History Narrative  . Not on file   Social Determinants of Health   Financial Resource Strain:   . Difficulty of Paying Living Expenses: Not on file  Food Insecurity:   . Worried About Charity fundraiser in the Last Year: Not on file  . Ran Out of Food in the Last Year: Not on file  Transportation Needs:   . Lack of Transportation (Medical): Not on file  . Lack of Transportation (Non-Medical): Not on file  Physical Activity:   . Days of  Exercise per Week: Not on file  . Minutes of Exercise per Session: Not on file  Stress:   . Feeling of Stress : Not on file  Social Connections:   . Frequency of Communication with Friends and Family: Not on file  . Frequency of Social Gatherings with Friends and Family: Not on file  . Attends Religious Services: Not on file  . Active Member of Clubs or Organizations: Not on file  . Attends Archivist Meetings: Not on file  . Marital Status: Not on file     Family History: The patient's family history includes Heart attack  in her father; Stroke in her father.  ROS:   Please see the history of present illness.     All other systems reviewed and are negative.  EKGs/Labs/Other Studies Reviewed:    The following studies were reviewed today:   EKG:  EKG not  ordered today.  Recent Labs: 07/21/2020: ALT 22; BUN 15; Creatinine, Ser 0.79; Hemoglobin 12.2; Platelets 415; Potassium 4.1; Sodium 139  Recent Lipid Panel No results found for: CHOL, TRIG, HDL, CHOLHDL, VLDL, LDLCALC, LDLDIRECT   Risk Assessment/Calculations:      Physical Exam:    VS:  BP 130/78 (BP Location: Left Arm, Patient Position: Sitting, Cuff Size: Normal)   Pulse 95   Ht 5' 1"  (1.549 m)   Wt 229 lb (103.9 kg)   SpO2 99%   BMI 43.27 kg/m     Wt Readings from Last 3 Encounters:  09/25/20 229 lb (103.9 kg)  09/23/20 230 lb (104.3 kg)  09/01/20 235 lb 8 oz (106.8 kg)     GEN:  Well nourished, well developed in no acute distress HEENT: Normal NECK: No JVD; No carotid bruits LYMPHATICS: No lymphadenopathy CARDIAC: RRR, no murmurs, rubs, gallops RESPIRATORY:  Clear to auscultation without rales, wheezing or rhonchi  ABDOMEN: Soft, non-tender, distended MUSCULOSKELETAL:  1+ edema; No deformity  SKIN: Warm and dry NEUROLOGIC:  Alert and oriented x 3 PSYCHIATRIC:  Normal affect   ASSESSMENT:    1. Bilateral lower extremity edema   2. Primary hypertension   3. Smoking   4. Morbid obesity  ([place])    PLAN:    In order of problems listed above:  1. Patient with bilateral lower extremity edema.  Varicose veins history, lower extremity ultrasound showed venous insufficiency.  Edema improved with torsemide, she might have some diastolic dysfunction.  Echo showed normal systolic function, indeterminate diastolic function, EF 60 to 65%.  Continue torsemide 10 mg daily 2. History of hypertension, BP controlled.  Continue losartan, HCTZ. 3. Current smoker, cessation recommended. 4. Patient is morbidly obese, low-calorie diet, exercise, weight loss advised.  Follow-up in 12 months   Medication Adjustments/Labs and Tests Ordered: Current medicines are reviewed at length with the patient today.  Concerns regarding medicines are outlined above.  No orders of the defined types were placed in this encounter.  No orders of the defined types were placed in this encounter.   Patient Instructions  Medication Instructions:  Your physician recommends that you continue on your current medications as directed. Please refer to the Current Medication list given to you today. *If you need a refill on your cardiac medications before your next appointment, please call your pharmacy*   Lab Work: None Ordered If you have labs (blood work) drawn today and your tests are completely normal, you will receive your results only by: Marland Kitchen MyChart Message (if you have MyChart) OR . A paper copy in the mail If you have any lab test that is abnormal or we need to change your treatment, we will call you to review the results.   Testing/Procedures: None Ordered   Follow-Up: At [facility], you and your health needs are our priority.  As part of our continuing mission to provide you with exceptional heart care, we have created designated Provider Care Teams.  These Care Teams include your primary Cardiologist (physician) and Advanced Practice Providers (APPs -  Physician Assistants and Nurse Practitioners)  who all work together to provide you with the care you need, when you need it.  We recommend  signing up for the patient portal called "MyChart".  Sign up information is provided on this After Visit Summary.  MyChart is used to connect with patients for Virtual Visits (Telemedicine).  Patients are able to view lab/test results, encounter notes, upcoming appointments, etc.  Non-urgent messages can be sent to your provider as well.   To learn more about what you can do with MyChart, go to [email or web address].    Your next appointment:   6-12 months   The format for your next appointment:   In Person  Provider:   Kate Sable, MD   Other Instructions     Signed, Kate Sable, MD  09/25/2020 2:15 PM    [place]

## 2020-09-25 NOTE — Patient Instructions (Signed)
Medication Instructions:  Your physician recommends that you continue on your current medications as directed. Please refer to the Current Medication list given to you today.  *If you need a refill on your cardiac medications before your next appointment, please call your pharmacy*   Lab Work: None Ordered If you have labs (blood work) drawn today and your tests are completely normal, you will receive your results only by: . MyChart Message (if you have MyChart) OR . A paper copy in the mail If you have any lab test that is abnormal or we need to change your treatment, we will call you to review the results.   Testing/Procedures: None Ordered   Follow-Up: At [facility], you and your health needs are our priority.  As part of our continuing mission to provide you with exceptional heart care, we have created designated Provider Care Teams.  These Care Teams include your primary Cardiologist (physician) and Advanced Practice Providers (APPs -  Physician Assistants and Nurse Practitioners) who all work together to provide you with the care you need, when you need it.  We recommend signing up for the patient portal called "MyChart".  Sign up information is provided on this After Visit Summary.  MyChart is used to connect with patients for Virtual Visits (Telemedicine).  Patients are able to view lab/test results, encounter notes, upcoming appointments, etc.  Non-urgent messages can be sent to your provider as well.   To learn more about what you can do with MyChart, go to [email or web address].    Your next appointment:   6-12 month(s)  The format for your next appointment:   In Person  Provider:   Brian Agbor-Etang, MD   Other Instructions   

## 2020-09-26 ENCOUNTER — Ambulatory Visit (INDEPENDENT_AMBULATORY_CARE_PROVIDER_SITE_OTHER): Payer: BC Managed Care – PPO | Admitting: Pulmonary Disease

## 2020-09-26 ENCOUNTER — Encounter: Payer: Self-pay | Admitting: Pulmonary Disease

## 2020-09-26 VITALS — BP 120/68 | HR 98 | Temp 98.3°F | Ht 61.0 in | Wt 231.1 lb

## 2020-09-26 DIAGNOSIS — Z720 Tobacco use: Secondary | ICD-10-CM | POA: Diagnosis not present

## 2020-09-26 MED ORDER — UMECLIDINIUM-VILANTEROL 62.5-25 MCG/INH IN AEPB: 1.0000 | INHALATION_SPRAY | Freq: Every day | RESPIRATORY_TRACT | 6 refills | Status: DC

## 2020-09-26 NOTE — Patient Instructions (Addendum)
Nice to meet you!  Use Anoro 1 puff a day - If COPD is present this would be a good medicine to use. Admittedly, your symptoms are mild at best so may not help much.   Stop spiriva.  We will get breathing (pulmonary function) tests in the coming days - I asked these be scheduled at [facility].   Come back for follow up in 6 months - either with Dr. Silas Flood or a provider in [place] - whatever you prefer.

## 2020-09-26 NOTE — Progress Notes (Signed)
@Patient  ID: [name], female    DOB: April 27, 1966, 54 y.o.   MRN: [id number]  Chief Complaint  Patient presents with  . Consult    ?COPD---referred by her PCP--    Referring provider: Martin Majestic, *  HPI:   54-year-old with a background who was seen in consultation at the request of Gordy Clement, NP for evaluation of possible COPD.  Notes from referring provider reviewed.  Patient states breathing is at baseline.  Denies significant dyspnea.  No real exertional limitation.  She is used Spiriva DPI in the past unsure if it helped much.  Currently prescribed Spiriva Respimat but a lot of sensation of the chest in the back of her throat.  Not using really.  Told by her PCP that she had COPD and this prompted a referral.  She denies any history spirometry or breathing test.  She was told she is done in the past but she does not recall this.  None to review in the EMR at this time.  Last x-ray in EMR 05/07/2016 reviewed and interpreted as clear lungs, mild hyperinflation on lateral view with increased lucency between heart and anterior chest.  Reviewed TTE 09/17/2020 which was essentially normal, no clear cause for dyspnea.  PMH: tobacco abuse, HTN Surgical history: left foot surgery, knee surgery Family history: CAD, stroke in father Social history: Lives in [place], current every day smoker, ~30 pack year       Questionaires / Pulmonary Flowsheets:   ACT:  No flowsheet data found.  MMRC: No flowsheet data found.  Epworth:  No flowsheet data found.  Tests:   FENO:  No results found for: NITRICOXIDE  PFT: No flowsheet data found.  WALK:  No flowsheet data found.  Imaging: Personally reviewed and as per EMR and discussion in this note MR SHOULDER RIGHT WO CONTRAST  Result Date: 09/02/2020 CLINICAL DATA:  Right shoulder pain radiating into the arm for 2 months. EXAM: MRI OF THE RIGHT SHOULDER WITHOUT CONTRAST TECHNIQUE: Multiplanar,  multisequence MR imaging of the shoulder was performed. No intravenous contrast was administered. COMPARISON:  None. FINDINGS: Rotator cuff: Severe tendinosis of the supraspinatus tendon. Severe tendinosis of the infraspinatus tendon with a large insertional interstitial tear and fraying along the bursal surface. Teres minor tendon is intact. Moderate tendinosis of the subscapularis tendon. Muscles: No muscle atrophy or edema. No intramuscular fluid collection or hematoma. Biceps Long Head: Complete tear of the proximal aspect of the long head of the biceps tendon. Severe tenosynovitis of the long head of the biceps tendon sheath. Acromioclavicular Joint: Mild arthropathy of the acromioclavicular joint. Type I acromion. Moderate amount of subacromial/subdeltoid bursal fluid. Glenohumeral Joint: Moderate joint effusion with synovitis. Full-thickness cartilage loss of the glenohumeral joint. Labrum: Superior labral degeneration. Bones: No fracture or dislocation. No aggressive osseous lesion. Subcortical reactive marrow changes at the infraspinatus insertion. Other: No fluid collection or hematoma. IMPRESSION: 1. Severe tendinosis of the supraspinatus tendon. 2. Severe tendinosis of the infraspinatus tendon with a large insertional interstitial tear and fraying along the bursal surface. 3. Moderate tendinosis of the subscapularis tendon. 4. Complete tear of the proximal aspect of the long head of the biceps tendon. Severe tenosynovitis of the long head of the biceps tendon sheath. 5. Full-thickness cartilage loss of the glenohumeral joint consistent with severe osteoarthritis. Moderate joint effusion with synovitis. 6. Moderate subacromial/subdeltoid bursitis. Electronically Signed   By: Kathreen Devoid   On: 09/02/2020 07:59   ECHOCARDIOGRAM COMPLETE  Result Date:  09/17/2020    ECHOCARDIOGRAM REPORT   Patient Name:   [name] Date of Exam: 09/17/2020 Medical Rec #:  [id number]        Height:       61.0 in  Accession #:    [id number]       Weight:       235.5 lb Date of Birth:  November 28, 1965       BSA:          2.025 m Patient Age:    54 years         BP:           120/78 mmHg Patient Gender: F                HR:           92 bpm. Exam Location:  [facility] Procedure: 2D Echo, Cardiac Doppler and Color Doppler Indications:    R01.1 Murmur  History:        Patient has no prior history of Echocardiogram examinations.                 Signs/Symptoms:Edema and Murmur; Risk Factors:Current Smoker.  Sonographer:    Pilar Jarvis RDMS, RVT, RDCS Referring Phys: [id number] BRIAN AGBOR-ETANG IMPRESSIONS  1. Left ventricular ejection fraction, by estimation, is 60 to 65%. The left ventricle has normal function. The left ventricle has no regional wall motion abnormalities. Left ventricular diastolic parameters are indeterminate.  2. Right ventricular systolic function is normal. The right ventricular size is normal. There is mildly elevated pulmonary artery systolic pressure.  3. The mitral valve is normal in structure. Mild mitral valve regurgitation. No evidence of mitral stenosis.  4. The aortic valve is tricuspid. Aortic valve regurgitation is not visualized. Mild aortic valve sclerosis is present, with no evidence of aortic valve stenosis.  5. The inferior vena cava is normal in size with greater than 50% respiratory variability, suggesting right atrial pressure of 3 mmHg. FINDINGS  Left Ventricle: Left ventricular ejection fraction, by estimation, is 60 to 65%. The left ventricle has normal function. The left ventricle has no regional wall motion abnormalities. The left ventricular internal cavity size was normal in size. There is  no left ventricular hypertrophy. Left ventricular diastolic parameters are indeterminate. Right Ventricle: The right ventricular size is normal. No increase in right ventricular wall thickness. Right ventricular systolic function is normal. There is mildly elevated pulmonary artery systolic pressure. The  tricuspid regurgitant velocity is 3.05  m/s, and with an assumed right atrial pressure of 3 mmHg, the estimated right ventricular systolic pressure is 63.3 mmHg. Left Atrium: Left atrial size was normal in size. Right Atrium: Right atrial size was normal in size. Pericardium: There is no evidence of pericardial effusion. Mitral Valve: The mitral valve is normal in structure. Mild mitral valve regurgitation. No evidence of mitral valve stenosis. Tricuspid Valve: The tricuspid valve is normal in structure. Tricuspid valve regurgitation is trivial. No evidence of tricuspid stenosis. Aortic Valve: The aortic valve is tricuspid. Aortic valve regurgitation is not visualized. Mild aortic valve sclerosis is present, with no evidence of aortic valve stenosis. Aortic valve mean gradient measures 9.0 mmHg. Aortic valve peak gradient measures 15.5 mmHg. Aortic valve area, by VTI measures 2.71 cm. Pulmonic Valve: The pulmonic valve was normal in structure. Pulmonic valve regurgitation is not visualized. No evidence of pulmonic stenosis. Aorta: The aortic root is normal in size and structure. Venous: The inferior vena cava is normal in size with  greater than 50% respiratory variability, suggesting right atrial pressure of 3 mmHg. IAS/Shunts: No atrial level shunt detected by color flow Doppler.  LEFT VENTRICLE PLAX 2D LVIDd:         4.60 cm      Diastology LVIDs:         2.60 cm      LV e' medial:    8.81 cm/s LV PW:         1.20 cm      LV E/e' medial:  13.6 LV IVS:        1.00 cm      LV e' lateral:   7.94 cm/s LVOT diam:     2.10 cm      LV E/e' lateral: 15.1 LV SV:         100 LV SV Index:   50 LVOT Area:     3.46 cm  LV Volumes (MOD) LV vol d, MOD A2C: 99.8 ml LV vol d, MOD A4C: 116.0 ml LV vol s, MOD A2C: 36.1 ml LV vol s, MOD A4C: 38.4 ml LV SV MOD A2C:     63.7 ml LV SV MOD A4C:     116.0 ml LV SV MOD BP:      73.1 ml RIGHT VENTRICLE             IVC RV Basal diam:  3.70 cm     IVC diam: 1.60 cm RV Mid diam:    3.10 cm  RV S prime:     13.90 cm/s TAPSE (M-mode): 2.4 cm LEFT ATRIUM             Index       RIGHT ATRIUM           Index LA diam:        4.20 cm 2.07 cm/m  RA Area:     12.60 cm LA Vol (A2C):   58.1 ml 28.70 ml/m RA Volume:   23.80 ml  11.76 ml/m LA Vol (A4C):   51.0 ml 25.19 ml/m LA Biplane Vol: 55.6 ml 27.46 ml/m  AORTIC VALVE                    PULMONIC VALVE AV Area (Vmax):    2.92 cm     PV Vmax:       1.15 m/s AV Area (Vmean):   3.00 cm     PV Peak grad:  5.3 mmHg AV Area (VTI):     2.71 cm AV Vmax:           197.00 cm/s AV Vmean:          135.000 cm/s AV VTI:            0.371 m AV Peak Grad:      15.5 mmHg AV Mean Grad:      9.0 mmHg LVOT Vmax:         166.00 cm/s LVOT Vmean:        117.000 cm/s LVOT VTI:          0.290 m LVOT/AV VTI ratio: 0.78  AORTA Ao Root diam: 2.80 cm Ao Asc diam:  2.70 cm Ao Arch diam: 2.6 cm MITRAL VALVE                TRICUSPID VALVE MV Area (PHT): 4.54 cm     TR Peak grad:   37.2 mmHg MV Decel Time: 167 msec     TR Vmax:  305.00 cm/s MV E velocity: 120.00 cm/s MV A velocity: 93.80 cm/s   SHUNTS MV E/A ratio:  1.28         Systemic VTI:  0.29 m                             Systemic Diam: 2.10 cm Kate Sable MD Electronically signed by Kate Sable MD Signature Date/Time: 09/17/2020/6:43:31 PM    Final    VAS [place] LOWER EXTREMITY VENOUS REFLUX  Result Date: 09/20/2020  Lower Venous Reflux Study Indications: Bilateral lower extremity swelling, edema and varicose veins.  Risk Factors: None identified. Performing Technologist: Sharlett Iles RVT  Examination Guidelines: A complete evaluation includes B-mode imaging, spectral Doppler, color Doppler, and power Doppler as needed of all accessible portions of each vessel. Bilateral testing is considered an integral part of a complete examination. Limited examinations for reoccurring indications may be performed as noted. The reflux portion of the exam is performed with the patient in reverse Trendelenburg. Significant  venous reflux is defined as >500 ms in the superficial venous system, and >1 second in the deep venous system.  Venous Reflux Times +----------------------+---------+------+-----------+------------+--------+ RIGHT                 Reflux NoRefluxReflux TimeDiameter cmsComments                                 Yes                                  +----------------------+---------+------+-----------+------------+--------+ CFV                   no                                             +----------------------+---------+------+-----------+------------+--------+ FV prox               no                                             +----------------------+---------+------+-----------+------------+--------+ FV mid                no                                             +----------------------+---------+------+-----------+------------+--------+ FV dist               no                                             +----------------------+---------+------+-----------+------------+--------+ Popliteal             no                                             +----------------------+---------+------+-----------+------------+--------+  GSV at [facility]            no                            .92              +----------------------+---------+------+-----------+------------+--------+ GSV prox thigh        no                            .63              +----------------------+---------+------+-----------+------------+--------+ GSV mid thigh         no                            .42              +----------------------+---------+------+-----------+------------+--------+ GSV dist thigh        no                            .53              +----------------------+---------+------+-----------+------------+--------+ GSV at knee           no                            .51               +----------------------+---------+------+-----------+------------+--------+ GSV prox calf         no                            .33              +----------------------+---------+------+-----------+------------+--------+ GSV mid calf          no                            .26              +----------------------+---------+------+-----------+------------+--------+ GSV dist calf         no                            .21              +----------------------+---------+------+-----------+------------+--------+ SSV, Giacomini        no                            .35              +----------------------+---------+------+-----------+------------+--------+ SSV prox calf                   yes    >500 ms      .38              +----------------------+---------+------+-----------+------------+--------+ SSV mid calf          no                            .40              +----------------------+---------+------+-----------+------------+--------+ Mid calf varicosity  yes    >500 ms                       +----------------------+---------+------+-----------+------------+--------+ Distal calf varicosity          yes    >500 ms                       +----------------------+---------+------+-----------+------------+--------+  +----------------------+---------+------+-----------+------------+--------+ LEFT                  Reflux NoRefluxReflux TimeDiameter cmsComments                                 Yes                                  +----------------------+---------+------+-----------+------------+--------+ CFV                   no                                             +----------------------+---------+------+-----------+------------+--------+ FV prox               no                                             +----------------------+---------+------+-----------+------------+--------+ FV mid                no                                              +----------------------+---------+------+-----------+------------+--------+ FV dist               no                                             +----------------------+---------+------+-----------+------------+--------+ Popliteal             no                                             +----------------------+---------+------+-----------+------------+--------+ GSV at SFJ            no                            .83              +----------------------+---------+------+-----------+------------+--------+ GSV prox thigh                                      .49              +----------------------+---------+------+-----------+------------+--------+ GSV mid thigh         no                            .  47              +----------------------+---------+------+-----------+------------+--------+ GSV dist thigh        no                            .51              +----------------------+---------+------+-----------+------------+--------+ GSV at knee                     yes    >500 ms      .46              +----------------------+---------+------+-----------+------------+--------+ GSV prox calf         no                            .33              +----------------------+---------+------+-----------+------------+--------+ GSV mid calf          no                            .37              +----------------------+---------+------+-----------+------------+--------+ GSV dist calf         no                            .32              +----------------------+---------+------+-----------+------------+--------+ SSV, Giacomini        no                            .24              +----------------------+---------+------+-----------+------------+--------+ SSV prox calf         no                            .23              +----------------------+---------+------+-----------+------------+--------+ SSV mid calf           no                            .23              +----------------------+---------+------+-----------+------------+--------+ Distal calf varicosityno                                             +----------------------+---------+------+-----------+------------+--------+   Summary: Bilateral: - No evidence of deep vein thrombosis seen in the lower extremities, bilaterally, from the common femoral through the popliteal veins. - No evidence of superficial venous thrombosis in the lower extremities, bilaterally.  Right: - Venous reflux is noted in the right short saphenous vein. - Venous reflux is noted in the varicosities in the mid and distal calf.  Left: - Venous reflux is noted in the left greater saphenous vein in the knee.  *See table(s) above for measurements and observations. Electronically signed by Carlyle Dolly MD on 09/20/2020 at 8:49:48 PM.    Final     Lab Results: Personally  reviewed, notably eos 400 07/2020 CBC    Component Value Date/Time   WBC 10.9 (H) 07/21/2020 1147   RBC 3.80 (L) 07/21/2020 1147   HGB 12.2 07/21/2020 1147   HGB 14.6 09/20/2012 1134   HCT 37.8 07/21/2020 1147   HCT 44.0 09/20/2012 1134   PLT 415 (H) 07/21/2020 1147   PLT 377 09/20/2012 1134   MCV 99.5 07/21/2020 1147   MCV 94 09/20/2012 1134   MCH 32.1 07/21/2020 1147   MCHC 32.3 07/21/2020 1147   RDW 13.3 07/21/2020 1147   RDW 13.0 09/20/2012 1134   LYMPHSABS 2.3 07/21/2020 1147   MONOABS 0.7 07/21/2020 1147   EOSABS 0.4 07/21/2020 1147   BASOSABS 0.1 07/21/2020 1147    BMET    Component Value Date/Time   NA 139 07/21/2020 1147   NA 139 09/20/2012 1134   K 4.1 07/21/2020 1147   K 4.4 09/20/2012 1134   CL 99 07/21/2020 1147   CL 103 09/20/2012 1134   CO2 28 07/21/2020 1147   CO2 28 09/20/2012 1134   GLUCOSE 122 (H) 07/21/2020 1147   GLUCOSE 87 09/20/2012 1134   BUN 15 07/21/2020 1147   BUN 17 09/20/2012 1134   CREATININE 0.79 07/21/2020 1147   CREATININE 0.72 09/20/2012 1134    CALCIUM 9.1 07/21/2020 1147   CALCIUM 8.8 09/20/2012 1134   GFRNONAA >60 07/21/2020 1147   GFRNONAA >60 09/20/2012 1134   GFRAA >60 07/21/2020 1147   GFRAA >60 09/20/2012 1134    BNP No results found for: BNP  ProBNP No results found for: PROBNP  Specialty Problems      Pulmonary Problems   OTHER RESPIRATORY COMPLICATIONS    Qualifier: Diagnosis of  By: Ta MD, Cat           Allergies  Allergen Reactions  . Augmentin [Amoxicillin-Pot Clavulanate] Anaphylaxis    Did it involve swelling of the face/tongue/throat, SOB, or low BP? Yes Did it involve sudden or severe rash/hives, skin peeling, or any reaction on the inside of your mouth or nose? No Did you need to seek medical attention at a hospital or doctor's office? Yes When did it last happen?15 years ago If all above answers are "NO", may proceed with cephalosporin use.   . Bee Venom Hives and Shortness Of Breath    Treats with benadryl  . Diclofenac Sodium Itching and Swelling    Can tolerate Voltaren  Cannot use PENNSAID- topical gel  . Other Hives and Shortness Of Breath    Reaction to spider bites (treats with benadryl)  . Sulfa Antibiotics Anaphylaxis and Other (See Comments)    Childhood allergic reaction Sulfonamides    Immunization History  Administered Date(s) Administered  . Hepb-cpg 04/06/2018, 06/20/2018  . Tdap 06/09/2011  . Zoster Recombinat (Shingrix) 06/20/2018, 10/20/2018    Past Medical History:  Diagnosis Date  . Arthritis   . COPD (chronic obstructive pulmonary disease) ([place])    denies  . COVID-19 10/23/2019   Asymptomatic  . Environmental and seasonal allergies   . Fibromyalgia   . History of colon polyps 2017  . Hypertension   . Pneumonia    aspiration PNA  . Wound infection    right foot    Tobacco History: Social History   Tobacco Use  Smoking Status Current Every Day Smoker  . Packs/day: 1.00  . Years: 30.00  . Pack years: 30.00  . Types: Cigarettes    Smokeless Tobacco Never Used   Ready to quit: Not Answered Counseling  given: Not Answered   Continue to not smoke  Outpatient Encounter Medications as of 09/26/2020  Medication Sig  . b complex vitamins tablet Take 1 tablet by mouth daily.  . cholecalciferol (VITAMIN D) 1000 units tablet Take 1,000 Units by mouth daily.   . fluticasone (FLONASE) 50 MCG/ACT nasal spray Place 1 spray into both nostrils daily.  Marland Kitchen gabapentin (NEURONTIN) 600 MG tablet Take 600 mg by mouth 3 (three) times daily.  . hydrOXYzine (ATARAX/VISTARIL) 25 MG tablet Take 25 mg by mouth 2 (two) times daily.  Marland Kitchen ibuprofen (ADVIL) 800 MG tablet Take 800 mg by mouth every 8 (eight) hours as needed for moderate pain.  Marland Kitchen losartan (COZAAR) 100 MG tablet Take 100 mg by mouth daily.  . Multiple Vitamin (MULTIVITAMIN WITH MINERALS) TABS tablet Take 1 tablet by mouth daily. 50 +  . NARCAN 4 MG/0.1ML LIQD nasal spray kit SMARTSIG:1 Both Nares Daily PRN  . oxyCODONE-acetaminophen (PERCOCET) 10-325 MG tablet SMARTSIG:1 By Mouth 4-5 Times Daily  . phentermine (ADIPEX-P) 37.5 MG tablet Take 1 tablet (37.5 mg total) by mouth daily before breakfast.  . torsemide (DEMADEX) 10 MG tablet Take 1 tablet (10 mg total) by mouth daily.  . traZODone (DESYREL) 50 MG tablet Take 1 tablet (50 mg total) by mouth at bedtime as needed for sleep.  Marland Kitchen venlafaxine XR (EFFEXOR-XR) 150 MG 24 hr capsule Take 1 capsule (150 mg total) by mouth daily.  . VENTOLIN HFA 108 (90 Base) MCG/ACT inhaler Inhale 2 puffs into the lungs every 4 (four) hours as needed for wheezing or shortness of breath.   . [DISCONTINUED] SPIRIVA RESPIMAT 2.5 MCG/ACT AERS SMARTSIG:2 Puff(s) Via Inhaler Daily  . umeclidinium-vilanterol (ANORO ELLIPTA) 62.5-25 MCG/INH AEPB Inhale 1 puff into the lungs daily.   No facility-administered encounter medications on file as of 09/26/2020.     Review of Systems  Review of Systems  No chest pain with exertion.  No orthopnea or PND.   Comprehensive review of systems otherwise negative. Physical Exam  BP 120/68   Pulse 98   Temp 98.3 F (36.8 C) (Tympanic)   Ht 5' 1"  (1.549 m)   Wt 231 lb 2 oz (104.8 kg)   SpO2 95%   BMI 43.67 kg/m   Wt Readings from Last 5 Encounters:  09/26/20 231 lb 2 oz (104.8 kg)  09/25/20 229 lb (103.9 kg)  09/23/20 230 lb (104.3 kg)  09/01/20 235 lb 8 oz (106.8 kg)  06/30/20 241 lb (109.3 kg)    BMI Readings from Last 5 Encounters:  09/26/20 43.67 kg/m  09/25/20 43.27 kg/m  09/23/20 43.46 kg/m  09/01/20 44.50 kg/m  06/30/20 45.54 kg/m     Physical Exam General: Well-appearing, no acute distress Eyes: EOMI, no icterus Neck: Supple, no JVP appreciated Respiratory: Distant breath sounds, clear to auscultation bilaterally Cardiovascular: Regular rhythm, no murmurs Abdomen: Nondistended, bowel sounds present MSK: No synovitis, no joint effusion Neuro: Normal gait, no weakness Psych: Normal mood, full affect  Assessment & Plan:   Tobacco Abuse: Wants to quit, plans to quit tomorrow. Congratulated. Advised gradual reduction in cigarettes a day if cold [place] does not work.   Possible COPD: Mild symptoms of DOE. Can not tolerate Respimat spiriva. Will trial Anoro. PFTs ordered to formally evaluate for COPD.  Return in about 6 months (around 03/26/2021).   Lanier Clam, MD 09/26/2020

## 2020-10-08 ENCOUNTER — Other Ambulatory Visit: Payer: Self-pay | Admitting: Obstetrics and Gynecology

## 2020-10-08 ENCOUNTER — Telehealth: Payer: Self-pay

## 2020-10-08 MED ORDER — TRAZODONE HCL 100 MG PO TABS
100.0000 mg | ORAL_TABLET | Freq: Every evening | ORAL | 4 refills | Status: DC | PRN
Start: 2020-10-08 — End: 2021-01-02

## 2020-10-08 NOTE — Telephone Encounter (Signed)
Rx has been changed to 100mg  tabs

## 2020-10-09 NOTE — Telephone Encounter (Signed)
Pt notified  RX changed to 100 MG tablets and sent to her pharmacy.

## 2020-10-28 NOTE — Telephone Encounter (Signed)
Patient requesting return call regarding the last medicine Dr. Georgianne Fick prescribed for her. [phone]

## 2020-10-30 ENCOUNTER — Ambulatory Visit (INDEPENDENT_AMBULATORY_CARE_PROVIDER_SITE_OTHER): Payer: BC Managed Care – PPO | Admitting: Obstetrics and Gynecology

## 2020-10-30 ENCOUNTER — Encounter: Payer: Self-pay | Admitting: Obstetrics and Gynecology

## 2020-10-30 ENCOUNTER — Other Ambulatory Visit: Payer: Self-pay

## 2020-10-30 DIAGNOSIS — Z6841 Body Mass Index (BMI) 40.0 and over, adult: Secondary | ICD-10-CM

## 2020-10-30 NOTE — Progress Notes (Signed)
I connected with [name] on 10/30/20 at 10:30 AM EST by telephone and verified that I am speaking with the correct person using two identifiers.   I discussed the limitations, risks, security and privacy concerns of performing an evaluation and management service by telephone and the availability of in person appointments. I also discussed with the patient that there may be a patient responsible charge related to this service. The patient expressed understanding and agreed to proceed.  The patient was at home I spoke with the patient from my workstation phone The names of people involved in this encounter were: [name] , and [name]   Gynecology Office Visit  Chief Complaint:  Chief Complaint  Patient presents with  . Follow-up    F/U medication, doing great no concerns. Phone visit    History of Present Illness: Patientis a 54 y.o. [place] female, who presents for the evaluation of the desire to lose weight. She has lost 9 pounds 1 months. The patient states the following symptoms since starting her weight loss therapy: appetite suppression, energy, and weight loss.  The patient also reports no other ill effects. The patient specifically denies heart palpitations, anxiety, and insomnia.   Review of Systems: 10 point review of systems negative unless otherwise noted in HPI  Past Medical History:  Past Medical History:  Diagnosis Date  . Arthritis   . COPD (chronic obstructive pulmonary disease) ([place])    denies  . COVID-19 10/23/2019   Asymptomatic  . Environmental and seasonal allergies   . Fibromyalgia   . History of colon polyps 2017  . Hypertension   . Pneumonia    aspiration PNA  . Wound infection    right foot    Past Surgical History:  Past Surgical History:  Procedure Laterality Date  . APPLICATION OF WOUND VAC Right 05/20/2020   Procedure: APPLICATION OF WOUND VAC TO RIGHT FOOT;  Surgeon: Erle Crocker, MD;  Location: [facility];   Service: Orthopedics;  Laterality: Right;  . COLONOSCOPY WITH PROPOFOL N/A 09/03/2016   Procedure: COLONOSCOPY WITH PROPOFOL;  Surgeon: [name], MD;  Location: [facility];  Service: Endoscopy;  Laterality: N/A;  . FRACTURE SURGERY     left foot   . INCISION AND DRAINAGE Right 05/20/2020   Procedure: RIGHT FOOT INCISION AND DRAINAGE;  Surgeon: Erle Crocker, MD;  Location: [facility];  Service: Orthopedics;  Laterality: Right;  LENGTH OF SURGERY: 1 HOUR  . KNEE CARTILAGE SURGERY  2015   left  . LAPAROSCOPIC SALPINGOOPHERECTOMY    . left hand     1 for ganglion cyst and next yr replace "joints" in left hand  . PLANTAR FASCIA SURGERY     left foot  . SHOULDER INJECTION Right 05/17/2016   Procedure: SHOULDER INJECTION;  Surgeon: Dorna Leitz, MD;  Location: [facility];  Service: Orthopedics;  Laterality: Right;  . SHOULDER SURGERY     x 2 on right shoulder  . TONSILLECTOMY    . TOTAL KNEE ARTHROPLASTY Left 05/17/2016   Procedure: TOTAL KNEE ARTHROPLASTY;  Surgeon: Dorna Leitz, MD;  Location: [facility];  Service: Orthopedics;  Laterality: Left;  . TOTAL KNEE ARTHROPLASTY Right 11/23/2019   Procedure: TOTAL KNEE ARTHROPLASTY;  Surgeon: Dorna Leitz, MD;  [facility];  Service: Orthopedics;  Laterality: Right;  . TOTAL SHOULDER ARTHROPLASTY Left 05/09/2015   Procedure: TOTAL SHOULDER ARTHROPLASTY;  Surgeon: Dorna Leitz, MD;  Location: [facility];  Service: Orthopedics;  Laterality: Left;  .  TUBAL LIGATION     tubes removed stated patient    Gynecologic History: No LMP recorded. Patient is postmenopausal.  Obstetric History: G0P0000  Family History:  Family History  Problem Relation Age of Onset  . Heart attack Father   . Stroke Father     Social History:  Social History   Socioeconomic History  . Marital status: Single    Spouse name: Not on file  . Number of children: Not on file  . Years of education: Not on file  . Highest education level: Not on file  Occupational History  .  Not on file  Tobacco Use  . Smoking status: Current Every Day Smoker    Packs/day: 1.00    Years: 30.00    Pack years: 30.00    Types: Cigarettes  . Smokeless tobacco: Never Used  Vaping Use  . Vaping Use: Never used  Substance and Sexual Activity  . Alcohol use: Not Currently  . Drug use: No  . Sexual activity: Yes    Birth control/protection: None  Other Topics Concern  . Not on file  Social History Narrative  . Not on file   Social Determinants of Health   Financial Resource Strain: Not on file  Food Insecurity: Not on file  Transportation Needs: Not on file  Physical Activity: Not on file  Stress: Not on file  Social Connections: Not on file  Intimate Partner Violence: Not on file    Allergies:  Allergies  Allergen Reactions  . Augmentin [Amoxicillin-Pot Clavulanate] Anaphylaxis    Did it involve swelling of the face/tongue/throat, SOB, or low BP? Yes Did it involve sudden or severe rash/hives, skin peeling, or any reaction on the inside of your mouth or nose? No Did you need to seek medical attention at a hospital or doctor's office? Yes When did it last happen?15 years ago If all above answers are "NO", may proceed with cephalosporin use.   . Bee Venom Hives and Shortness Of Breath    Treats with benadryl  . Diclofenac Sodium Itching and Swelling    Can tolerate Voltaren  Cannot use PENNSAID- topical gel  . Other Hives and Shortness Of Breath    Reaction to spider bites (treats with benadryl)  . Sulfa Antibiotics Anaphylaxis and Other (See Comments)    Childhood allergic reaction Sulfonamides    Medications: Prior to Admission medications   Medication Sig Start Date End Date Taking? Authorizing Provider  b complex vitamins tablet Take 1 tablet by mouth daily.   Yes [provider]  cholecalciferol (VITAMIN D) 1000 units tablet Take 1,000 Units by mouth daily.    Yes [provider]  fluticasone (FLONASE) 50 MCG/ACT nasal spray  Place 1 spray into both nostrils daily.   Yes [provider]  gabapentin (NEURONTIN) 600 MG tablet Take 600 mg by mouth 3 (three) times daily. 07/26/20  Yes [provider]  hydrOXYzine (ATARAX/VISTARIL) 25 MG tablet Take 25 mg by mouth 2 (two) times daily. 08/12/20  Yes [provider]  ibuprofen (ADVIL) 800 MG tablet Take 800 mg by mouth every 8 (eight) hours as needed for moderate pain.   Yes [provider]  losartan (COZAAR) 100 MG tablet Take 100 mg by mouth daily.   Yes [provider]  Multiple Vitamin (MULTIVITAMIN WITH MINERALS) TABS tablet Take 1 tablet by mouth daily. 50 +   Yes [provider]  NARCAN 4 MG/0.1ML LIQD nasal spray kit SMARTSIG:1 Both Nares Daily PRN 05/21/20  Yes [provider]  oxyCODONE-acetaminophen (PERCOCET) 10-325 MG tablet SMARTSIG:1 By Mouth 4-5 Times Daily 08/10/20  Yes [provider]  phentermine (ADIPEX-P) 37.5 MG tablet Take 1 tablet (37.5 mg total) by mouth daily before breakfast. 09/23/20  Yes April Mood, MD  torsemide (DEMADEX) 10 MG tablet Take 1 tablet (10 mg total) by mouth daily. 09/01/20  Yes Kate Sable, MD  traZODone (DESYREL) 100 MG tablet Take 1 tablet (100 mg total) by mouth at bedtime as needed for sleep. 10/08/20  Yes April Mood, MD  umeclidinium-vilanterol [facility] ELLIPTA) 62.5-25 MCG/INH AEPB Inhale 1 puff into the lungs daily. 09/26/20  Yes Hunsucker, Bonna Gains, MD  venlafaxine XR (EFFEXOR-XR) 150 MG 24 hr capsule Take 1 capsule (150 mg total) by mouth daily. 06/30/20  Yes April Mood, MD  VENTOLIN HFA 108 [phone] Base) MCG/ACT inhaler Inhale 2 puffs into the lungs every 4 (four) hours as needed for wheezing or shortness of breath.  03/19/20  Yes [provider]    Physical Exam Height 5' 1"  (1.549 m), weight 222 lb (100.7 kg). Wt Readings from Last 3 Encounters:  10/30/20 222 lb (100.7 kg)  09/26/20 231 lb 2 oz (104.8 kg)  09/25/20 229 lb  (103.9 kg)  Body mass index is 41.95 kg/m.  No physical exam as this was a remote telephone visit to promote social distancing during the current COVID-19 Pandemic   Assessment: 54 y.o. G0P0000 medication follow up  Plan: Problem List Items Addressed This Visit   None   Visit Diagnoses    Class 3 severe obesity without serious comorbidity with body mass index (BMI) of 40.0 to 44.9 in adult, unspecified obesity type ([place])    -  Primary      1) 1500 Calorie ADA Diet  2) Patient education given regarding appropriate lifestyle changes for weight loss including: regular physical activity, healthy coping strategies, caloric restriction and healthy eating patterns.  3) Having shoulder surgery and hip surgery next year.  Needs to loose an additional 12lbs per her orthopedic surgeon to proceed with hip surgery  4) Patient to take medication, with the benefits of appetite suppression and metabolism boost d/w pt, along with the side effects and risk factors of long term use that will be avoided with our use of short bursts of therapy. Rx provided.    5) Telephone 08:3mn  6)  Return in about 4 weeks (around 11/27/2020) for medication follow up (phone).    AMalachy Mood MD, [facility], [facility] 10/30/2020, 9:42 AM

## 2020-11-03 ENCOUNTER — Ambulatory Visit: Payer: BC Managed Care – PPO | Admitting: Pulmonary Disease

## 2020-11-03 ENCOUNTER — Telehealth: Payer: Self-pay

## 2020-11-03 NOTE — Telephone Encounter (Signed)
Patient had a phone visit w/[facility] 10/30/20. Pharmacy has not received the rx for phentermine. [phone]

## 2020-11-03 NOTE — Telephone Encounter (Signed)
Patient requests to be notified once prescription has been sent over.

## 2020-11-04 ENCOUNTER — Other Ambulatory Visit: Payer: Self-pay | Admitting: Obstetrics and Gynecology

## 2020-11-04 MED ORDER — PHENTERMINE HCL 37.5 MG PO TABS
37.5000 mg | ORAL_TABLET | Freq: Every day | ORAL | 0 refills | Status: DC
Start: 2020-11-04 — End: 2020-11-27

## 2020-11-17 ENCOUNTER — Telehealth: Payer: Self-pay | Admitting: Cardiology

## 2020-11-17 NOTE — Telephone Encounter (Signed)
   [facility] Pre-operative Risk Assessment    HEARTCARE STAFF: - Please ensure there is not already an duplicate clearance open for this procedure. - Under Visit Info/Reason for Call, type in Other and utilize the format Clearance MM/DD/YY or Clearance TBD. Do not use dashes or single digits. - If request is for dental extraction, please clarify the # of teeth to be extracted.  Request for surgical clearance:  1. What type of surgery is being performed? right shoulder arthroscopy rotator cuff repair, debridement   2. When is this surgery scheduled? 12/02/20  3. What type of clearance is required (medical clearance vs. Pharmacy clearance to hold med vs. Both)?  both  4. Are there any medications that need to be held prior to surgery and how long? Not noted  5. Practice name and name of physician performing surgery? [facility]  6. What is the office phone number? [phone]   7.   What is the office fax number? [phone]  8.   Anesthesia type (None, local, MAC, general) ? Choice    Saunders Glance Bumgarner 11/17/2020, 1:40 PM  _________________________________________________________________   (provider comments below)

## 2020-11-17 NOTE — Telephone Encounter (Signed)
   Primary Cardiologist: Kate Sable, MD  Chart reviewed as part of pre-operative protocol coverage. Patient was contacted 11/17/2020 in reference to pre-operative risk assessment for pending surgery as outlined below.  [name] was last seen on 09/25/20 by Dr. Garen Lah.  Since that day, [name] has done well. She can complete 4.0 METS without angina.  She does not have a history of MI or stroke.   Therefore, based on ACC/AHA guidelines, the patient would be at acceptable risk for the planned procedure without further cardiovascular testing.   The patient was advised that if she develops new symptoms prior to surgery to contact our office to arrange for a follow-up visit, and she verbalized understanding.  I will route this recommendation to the requesting party via Epic fax function and remove from pre-op pool. Please call with questions.  Ledora Bottcher, PA 11/17/2020, 2:37 PM

## 2020-11-27 ENCOUNTER — Other Ambulatory Visit: Payer: Self-pay

## 2020-11-27 ENCOUNTER — Ambulatory Visit (INDEPENDENT_AMBULATORY_CARE_PROVIDER_SITE_OTHER): Payer: BC Managed Care – PPO | Admitting: Obstetrics and Gynecology

## 2020-11-27 ENCOUNTER — Encounter: Payer: Self-pay | Admitting: Obstetrics and Gynecology

## 2020-11-27 VITALS — Ht 61.0 in | Wt 208.0 lb

## 2020-11-27 DIAGNOSIS — Z6839 Body mass index (BMI) 39.0-39.9, adult: Secondary | ICD-10-CM | POA: Diagnosis not present

## 2020-11-27 MED ORDER — PHENTERMINE HCL 37.5 MG PO TABS
37.5000 mg | ORAL_TABLET | Freq: Every day | ORAL | 0 refills | Status: DC
Start: 2020-11-27 — End: 2021-01-01

## 2020-11-27 NOTE — Progress Notes (Signed)
I connected with [name] on 11/27/20 at  9:50 AM EST by telephone and verified that I am speaking with the correct person using two identifiers.   I discussed the limitations, risks, security and privacy concerns of performing an evaluation and management service by telephone and the availability of in person appointments. I also discussed with the patient that there may be a patient responsible charge related to this service. The patient expressed understanding and agreed to proceed.  The patient was at home I spoke with the patient from my workstation phone The names of people involved in this encounter were: [name] , and [name]   Gynecology Office Visit  Chief Complaint:  Chief Complaint  Patient presents with  . Follow-up    Phone visit - F/U medication - no concerns.    History of Present Illness: Patientis a 55 y.o. [name] female, who presents for the evaluation of the desire to lose weight. She has lost 14 pounds 1 months, 21lbs in since November. The patient states the following symptoms since starting her weight loss therapy: appetite suppression, energy, and weight loss.  The patient also reports no other ill effects. The patient specifically denies heart palpitations, anxiety, and insomnia.    Review of Systems: 10 point review of systems negative unless otherwise noted in HPI  Past Medical History:  Past Medical History:  Diagnosis Date  . Arthritis   . COPD (chronic obstructive pulmonary disease) ([place])    denies  . COVID-19 10/23/2019   Asymptomatic  . Environmental and seasonal allergies   . Fibromyalgia   . History of colon polyps 2017  . Hypertension   . Pneumonia    aspiration PNA  . Wound infection    right foot    Past Surgical History:  Past Surgical History:  Procedure Laterality Date  . APPLICATION OF WOUND VAC Right 05/20/2020   Procedure: APPLICATION OF WOUND VAC TO RIGHT FOOT;  Surgeon: Erle Crocker, MD;   Location: [facility];  Service: Orthopedics;  Laterality: Right;  . COLONOSCOPY WITH PROPOFOL N/A 09/03/2016   Procedure: COLONOSCOPY WITH PROPOFOL;  Surgeon: [name], MD;  Location: [facility];  Service: Endoscopy;  Laterality: N/A;  . FRACTURE SURGERY     left foot   . INCISION AND DRAINAGE Right 05/20/2020   Procedure: RIGHT FOOT INCISION AND DRAINAGE;  Surgeon: Erle Crocker, MD;  Location: [facility];  Service: Orthopedics;  Laterality: Right;  LENGTH OF SURGERY: 1 HOUR  . KNEE CARTILAGE SURGERY  2015   left  . LAPAROSCOPIC SALPINGOOPHERECTOMY    . left hand     1 for ganglion cyst and next yr replace "joints" in left hand  . PLANTAR FASCIA SURGERY     left foot  . SHOULDER INJECTION Right 05/17/2016   Procedure: SHOULDER INJECTION;  Surgeon: Dorna Leitz, MD;  Location: [facility];  Service: Orthopedics;  Laterality: Right;  . SHOULDER SURGERY     x 2 on right shoulder  . TONSILLECTOMY    . TOTAL KNEE ARTHROPLASTY Left 05/17/2016   Procedure: TOTAL KNEE ARTHROPLASTY;  Surgeon: Dorna Leitz, MD;  Location: [facility];  Service: Orthopedics;  Laterality: Left;  . TOTAL KNEE ARTHROPLASTY Right 11/23/2019   Procedure: TOTAL KNEE ARTHROPLASTY;  Surgeon: Dorna Leitz, MD;  [facility];  Service: Orthopedics;  Laterality: Right;  . TOTAL SHOULDER ARTHROPLASTY Left 05/09/2015   Procedure: TOTAL SHOULDER ARTHROPLASTY;  Surgeon: Dorna Leitz, MD;  Location: [facility];  Service:  Orthopedics;  Laterality: Left;  . TUBAL LIGATION     tubes removed stated patient    Gynecologic History: No LMP recorded. Patient is postmenopausal.  Obstetric History: G0P0000  Family History:  Family History  Problem Relation Age of Onset  . Heart attack Father   . Stroke Father     Social History:  Social History   Socioeconomic History  . Marital status: Single    Spouse name: Not on file  . Number of children: Not on file  . Years of education: Not on file  . Highest education level: Not on file   Occupational History  . Not on file  Tobacco Use  . Smoking status: Current Every Day Smoker    Packs/day: 1.00    Years: 30.00    Pack years: 30.00    Types: Cigarettes  . Smokeless tobacco: Never Used  Vaping Use  . Vaping Use: Never used  Substance and Sexual Activity  . Alcohol use: Not Currently  . Drug use: No  . Sexual activity: Yes    Birth control/protection: None  Other Topics Concern  . Not on file  Social History Narrative  . Not on file   Social Determinants of Health   Financial Resource Strain: Not on file  Food Insecurity: Not on file  Transportation Needs: Not on file  Physical Activity: Not on file  Stress: Not on file  Social Connections: Not on file  Intimate Partner Violence: Not on file    Allergies:  Allergies  Allergen Reactions  . Augmentin [Amoxicillin-Pot Clavulanate] Anaphylaxis    Did it involve swelling of the face/tongue/throat, SOB, or low BP? Yes Did it involve sudden or severe rash/hives, skin peeling, or any reaction on the inside of your mouth or nose? No Did you need to seek medical attention at a hospital or doctor's office? Yes When did it last happen?15 years ago If all above answers are "NO", may proceed with cephalosporin use.   . Bee Venom Hives and Shortness Of Breath    Treats with benadryl  . Diclofenac Sodium Itching and Swelling    Can tolerate Voltaren  Cannot use PENNSAID- topical gel  . Other Hives and Shortness Of Breath    Reaction to spider bites (treats with benadryl)  . Sulfa Antibiotics Anaphylaxis and Other (See Comments)    Childhood allergic reaction Sulfonamides    Medications: Prior to Admission medications   Medication Sig Start Date End Date Taking? Authorizing Provider  b complex vitamins tablet Take 1 tablet by mouth daily.   Yes [provider]  cholecalciferol (VITAMIN D) 1000 units tablet Take 1,000 Units by mouth daily.    Yes [provider]  fluticasone  (FLONASE) 50 MCG/ACT nasal spray Place 1 spray into both nostrils daily.   Yes [provider]  ibuprofen (ADVIL) 800 MG tablet Take 800 mg by mouth every 8 (eight) hours as needed for moderate pain.   Yes [provider]  losartan (COZAAR) 100 MG tablet Take 100 mg by mouth daily.   Yes [provider]  Multiple Vitamin (MULTIVITAMIN WITH MINERALS) TABS tablet Take 1 tablet by mouth daily. 10 +   Yes [provider]  NARCAN 4 MG/0.1ML LIQD nasal spray kit SMARTSIG:1 Both Nares Daily PRN 05/21/20  Yes [provider]  oxyCODONE-acetaminophen (PERCOCET) 10-325 MG tablet SMARTSIG:1 By Mouth 4-5 Times Daily 08/10/20  Yes [provider]  phentermine (ADIPEX-P) 37.5 MG tablet Take 1 tablet (37.5 mg total) by mouth  daily before breakfast. 11/04/20  Yes [name], MD  torsemide (DEMADEX) 10 MG tablet Take 1 tablet (10 mg total) by mouth daily. 09/01/20  Yes Kate Sable, MD  traZODone (DESYREL) 100 MG tablet Take 1 tablet (100 mg total) by mouth at bedtime as needed for sleep. 10/08/20  Yes [name], MD  umeclidinium-vilanterol [facility] ELLIPTA) 62.5-25 MCG/INH AEPB Inhale 1 puff into the lungs daily. 09/26/20  Yes Hunsucker, Bonna Gains, MD  venlafaxine XR (EFFEXOR-XR) 150 MG 24 hr capsule Take 1 capsule (150 mg total) by mouth daily. 06/30/20  Yes [name], MD  VENTOLIN HFA 108 [phone] Base) MCG/ACT inhaler Inhale 2 puffs into the lungs every 4 (four) hours as needed for wheezing or shortness of breath.  03/19/20  Yes [provider]  gabapentin (NEURONTIN) 600 MG tablet Take 600 mg by mouth 3 (three) times daily. Patient not taking: Reported on 11/27/2020 07/26/20   [provider]  hydrOXYzine (ATARAX/VISTARIL) 25 MG tablet Take 25 mg by mouth 2 (two) times daily. 08/12/20   [provider]    Physical Exam Height 5' 1"  (1.549 m), weight 208 lb (94.3 kg). Wt Readings from Last 3 Encounters:  11/27/20  208 lb (94.3 kg)  10/30/20 222 lb (100.7 kg)  09/26/20 231 lb 2 oz (104.8 kg)  Body mass index is 39.3 kg/m.   No physical exam as this was a remote telephone visit to promote social distancing during the current COVID-19 Pandemic   Assessment: 55 y.o. G0P0000 follow up medical weight loss   Plan: Problem List Items Addressed This Visit   None   Visit Diagnoses    Class 2 severe obesity with serious comorbidity and body mass index (BMI) of 39.0 to 39.9 in adult, unspecified obesity type ([place])    -  Primary   Relevant Medications   phentermine (ADIPEX-P) 37.5 MG tablet      1) 1500 Calorie ADA Diet  2) Patient education given regarding appropriate lifestyle changes for weight loss including: regular physical activity, healthy coping strategies, caloric restriction and healthy eating patterns.  3) Patient will be started on weight loss medication. The risks and benefits and side effects of medication, such as Adipex (Phenteramine) ,  Tenuate (Diethylproprion), Contrave (buproprion/naltrexone), Qsymia (phentermine/topiramate), and Saxenda (liraglutide) is discussed. The pros and cons of suppressing appetite and boosting metabolism is discussed. Risks of tolerence and addiction is discussed for selected agents discussed. Use of medicine will ne short term, such as 3-4 months at a time followed by a period of time off of the medicine to avoid these risks and side effects for Adipex, Qsymia, and Tenuate discussed. Pt to call with any negative side effects and agrees to keep follow up appts.  4) Patient to take medication, with the benefits of appetite suppression and metabolism boost d/w pt, along with the side effects and risk factors of long term use that will be avoided with our use of short bursts of therapy. Rx provided.    5) Telephone Time 09:02  6)  Return if symptoms worsen or fail to improve.    [name], MD, [facility], [facility] 11/27/2020, 10:23 AM

## 2021-01-01 ENCOUNTER — Other Ambulatory Visit: Payer: Self-pay

## 2021-01-01 ENCOUNTER — Encounter: Payer: Self-pay | Admitting: Obstetrics and Gynecology

## 2021-01-01 ENCOUNTER — Telehealth: Payer: Self-pay

## 2021-01-01 ENCOUNTER — Ambulatory Visit (INDEPENDENT_AMBULATORY_CARE_PROVIDER_SITE_OTHER): Payer: BC Managed Care – PPO | Admitting: Obstetrics and Gynecology

## 2021-01-01 DIAGNOSIS — Z6841 Body Mass Index (BMI) 40.0 and over, adult: Secondary | ICD-10-CM | POA: Diagnosis not present

## 2021-01-01 MED ORDER — DIETHYLPROPION HCL ER 75 MG PO TB24: 1.0000 | ORAL_TABLET | Freq: Every day | ORAL | 0 refills | Status: DC

## 2021-01-01 NOTE — Telephone Encounter (Signed)
Pt called wanting to know what rx [facility] sent in for her, pt aware it was the weight loss meds

## 2021-01-01 NOTE — Progress Notes (Signed)
I connected with [name] on 01/01/21 at 11:10 AM EST by telephone and verified that I am speaking with the correct person using two identifiers.   I discussed the limitations, risks, security and privacy concerns of performing an evaluation and management service by telephone and the availability of in person appointments. I also discussed with the patient that there may be a patient responsible charge related to this service. The patient expressed understanding and agreed to proceed.  The patient was at home I spoke with the patient from my workstation phone The names of people involved in this encounter were: [name] , and [name]   Gynecology Office Visit  Chief Complaint:  Chief Complaint  Patient presents with  . Follow-up    Phone visit - F/U medication    History of Present Illness: Patientis a 55 y.o. [name] female, who presents for the evaluation of the desire to lose weight. She gainedt 6 pounds 1 months. The patient states the following symptoms since starting her weight loss therapy: appetite suppression, energy, and weight loss have all decreased in the past month.  She was out for orthopedic procedure and less active..  The patient also reports no other ill effects. The patient specifically denies heart palpitations, anxiety, and insomnia.   Continue to go to gym   Review of Systems: 10 point review of systems negative unless otherwise noted in HPI  Past Medical History:  Past Medical History:  Diagnosis Date  . Arthritis   . COPD (chronic obstructive pulmonary disease) ([place])    denies  . COVID-19 10/23/2019   Asymptomatic  . Environmental and seasonal allergies   . Fibromyalgia   . History of colon polyps 2017  . Hypertension   . Pneumonia    aspiration PNA  . Wound infection    right foot    Past Surgical History:  Past Surgical History:  Procedure Laterality Date  . APPLICATION OF WOUND VAC Right 05/20/2020   Procedure:  APPLICATION OF WOUND VAC TO RIGHT FOOT;  Surgeon: Erle Crocker, MD;  Location: [place];  Service: Orthopedics;  Laterality: Right;  . COLONOSCOPY WITH PROPOFOL N/A 09/03/2016   Procedure: COLONOSCOPY WITH PROPOFOL;  Surgeon: [name], MD;  Location: [facility];  Service: Endoscopy;  Laterality: N/A;  . FRACTURE SURGERY     left foot   . INCISION AND DRAINAGE Right 05/20/2020   Procedure: RIGHT FOOT INCISION AND DRAINAGE;  Surgeon: Erle Crocker, MD;  Location: [place];  Service: Orthopedics;  Laterality: Right;  LENGTH OF SURGERY: 1 HOUR  . KNEE CARTILAGE SURGERY  2015   left  . LAPAROSCOPIC SALPINGOOPHERECTOMY    . left hand     1 for ganglion cyst and next yr replace "joints" in left hand  . PLANTAR FASCIA SURGERY     left foot  . SHOULDER INJECTION Right 05/17/2016   Procedure: SHOULDER INJECTION;  Surgeon: Dorna Leitz, MD;  Location: [place];  Service: Orthopedics;  Laterality: Right;  . SHOULDER SURGERY     x 2 on right shoulder  . TONSILLECTOMY    . TOTAL KNEE ARTHROPLASTY Left 05/17/2016   Procedure: TOTAL KNEE ARTHROPLASTY;  Surgeon: Dorna Leitz, MD;  Location: [place];  Service: Orthopedics;  Laterality: Left;  . TOTAL KNEE ARTHROPLASTY Right 11/23/2019   Procedure: TOTAL KNEE ARTHROPLASTY;  Surgeon: Dorna Leitz, MD;  Location: [facility];  Service: Orthopedics;  Laterality: Right;  . TOTAL SHOULDER ARTHROPLASTY Left 05/09/2015   Procedure:  TOTAL SHOULDER ARTHROPLASTY;  Surgeon: Dorna Leitz, MD;  Location: [place];  Service: Orthopedics;  Laterality: Left;  . TUBAL LIGATION     tubes removed stated patient    Gynecologic History: No LMP recorded. Patient is postmenopausal.  Obstetric History: G0P0000  Family History:  Family History  Problem Relation Age of Onset  . Heart attack Father   . Stroke Father     Social History:  Social History   Socioeconomic History  . Marital status: Single    Spouse name: Not on file  . Number of children: Not on file  . Years  of education: Not on file  . Highest education level: Not on file  Occupational History  . Not on file  Tobacco Use  . Smoking status: Current Every Day Smoker    Packs/day: 1.00    Years: 30.00    Pack years: 30.00    Types: Cigarettes  . Smokeless tobacco: Never Used  Vaping Use  . Vaping Use: Never used  Substance and Sexual Activity  . Alcohol use: Not Currently  . Drug use: No  . Sexual activity: Yes    Birth control/protection: None  Other Topics Concern  . Not on file  Social History Narrative  . Not on file   Social Determinants of Health   Financial Resource Strain: Not on file  Food Insecurity: Not on file  Transportation Needs: Not on file  Physical Activity: Not on file  Stress: Not on file  Social Connections: Not on file  Intimate Partner Violence: Not on file    Allergies:  Allergies  Allergen Reactions  . Augmentin [Amoxicillin-Pot Clavulanate] Anaphylaxis    Did it involve swelling of the face/tongue/throat, SOB, or low BP? Yes Did it involve sudden or severe rash/hives, skin peeling, or any reaction on the inside of your mouth or nose? No Did you need to seek medical attention at a hospital or doctor's office? Yes When did it last happen?15 years ago If all above answers are "NO", may proceed with cephalosporin use.   . Bee Venom Hives and Shortness Of Breath    Treats with benadryl  . Diclofenac Sodium Itching and Swelling    Can tolerate Voltaren  Cannot use PENNSAID- topical gel  . Other Hives and Shortness Of Breath    Reaction to spider bites (treats with benadryl)  . Sulfa Antibiotics Anaphylaxis and Other (See Comments)    Childhood allergic reaction Sulfonamides    Medications: Prior to Admission medications   Medication Sig Start Date End Date Taking? Authorizing Provider  b complex vitamins tablet Take 1 tablet by mouth daily.   Yes [provider]  cholecalciferol (VITAMIN D) 1000 units tablet Take 1,000 Units by  mouth daily.    Yes [provider]  fluticasone (FLONASE) 50 MCG/ACT nasal spray Place 1 spray into both nostrils daily.   Yes [provider]  hydrOXYzine (ATARAX/VISTARIL) 25 MG tablet Take 25 mg by mouth 2 (two) times daily. 08/12/20  Yes [provider]  ibuprofen (ADVIL) 800 MG tablet Take 800 mg by mouth every 8 (eight) hours as needed for moderate pain.   Yes [provider]  levofloxacin (LEVAQUIN) 500 MG tablet Take 500 mg by mouth daily. 10/13/20  Yes [provider]  losartan (COZAAR) 100 MG tablet Take 100 mg by mouth daily.   Yes [provider]  Multiple Vitamin (MULTIVITAMIN WITH MINERALS) TABS tablet Take 1 tablet by mouth daily. 50 +   Yes [provider]  NARCAN 4 MG/0.1ML LIQD nasal spray kit SMARTSIG:1 Both Nares Daily PRN 05/21/20  Yes [provider]  oxyCODONE-acetaminophen (PERCOCET) 10-325 MG tablet SMARTSIG:1 By Mouth 4-5 Times Daily 08/10/20  Yes [provider]  phentermine (ADIPEX-P) 37.5 MG tablet Take 1 tablet (37.5 mg total) by mouth daily before breakfast. 11/27/20  Yes April Mood, MD  torsemide (DEMADEX) 10 MG tablet Take 1 tablet (10 mg total) by mouth daily. 09/01/20  Yes Kate Sable, MD  traZODone (DESYREL) 100 MG tablet Take 1 tablet (100 mg total) by mouth at bedtime as needed for sleep. 10/08/20  Yes April Mood, MD  umeclidinium-vilanterol [facility] ELLIPTA) 62.5-25 MCG/INH AEPB Inhale 1 puff into the lungs daily. 09/26/20  Yes Hunsucker, Bonna Gains, MD  venlafaxine XR (EFFEXOR-XR) 150 MG 24 hr capsule Take 1 capsule (150 mg total) by mouth daily. 06/30/20  Yes April Mood, MD  VENTOLIN HFA 108 [id number] Base) MCG/ACT inhaler Inhale 2 puffs into the lungs every 4 (four) hours as needed for wheezing or shortness of breath.  03/19/20  Yes [provider]  gabapentin (NEURONTIN) 600 MG tablet Take 600 mg by mouth 3 (three) times daily. Patient not taking: Reported  on 11/27/2020 07/26/20   [provider]    Physical Exam Height 5' 1"  (1.549 m), weight 214 lb (97.1 kg). Wt Readings from Last 3 Encounters:  01/01/21 214 lb (97.1 kg)  11/27/20 208 lb (94.3 kg)  10/30/20 222 lb (100.7 kg)  Body mass index is 40.43 kg/m.  No physical exam as this was a remote telephone visit to promote social distancing during the current COVID-19 Pandemic   Assessment: 55 y.o. G0P0000 medical weight loss follow up  Plan: Problem List Items Addressed This Visit   None   Visit Diagnoses    Class 3 severe obesity without serious comorbidity with body mass index (BMI) of 40.0 to 44.9 in adult, unspecified obesity type ([place])    -  Primary   Relevant Medications   Diethylpropion HCl CR 75 MG TB24      1) 1500 Calorie ADA Diet  2) Patient education given regarding appropriate lifestyle changes for weight loss including: regular physical activity, healthy coping strategies, caloric restriction and healthy eating patterns.  3) Patient will be started on weight loss medication. The risks and benefits and side effects of medication, such as Adipex (Phenteramine) ,  Tenuate (Diethylproprion), Belviq (lorcarsin), Contrave (buproprion/naltrexone), Qsymia (phentermine/topiramate), and Saxenda (liraglutide) is discussed. The pros and cons of suppressing appetite and boosting metabolism is discussed. Risks of tolerence and addiction is discussed for selected agents discussed. Use of medicine will ne short term, such as 3-4 months at a time followed by a period of time off of the medicine to avoid these risks and side effects for Adipex, Qsymia, and Tenuate discussed. Pt to call with any negative side effects and agrees to keep follow up appts. - switch diethylpropion   4) Patient to take medication, with the benefits of appetite suppression and metabolism boost d/w pt, along with the side effects and risk factors of long term use that will be avoided with our use of short  bursts of therapy. Rx provided.    5) Telephone Time 11:30  6)  Return in about 4 weeks (around 01/29/2021) for medication follow up phone.    April Mood, MD, [facility], [facility] 01/01/2021, 8:40 AM

## 2021-01-02 ENCOUNTER — Telehealth: Payer: Self-pay

## 2021-01-02 MED ORDER — VENLAFAXINE HCL ER 150 MG PO CP24: 150.0000 mg | ORAL_CAPSULE | Freq: Every day | ORAL | 3 refills | Status: DC

## 2021-01-02 MED ORDER — TRAZODONE HCL 100 MG PO TABS
100.0000 mg | ORAL_TABLET | Freq: Every evening | ORAL | 4 refills | Status: DC | PRN
Start: 2021-01-02 — End: 2021-01-02

## 2021-01-02 MED ORDER — DIETHYLPROPION HCL ER 75 MG PO TB24: 1.0000 | ORAL_TABLET | Freq: Every day | ORAL | 0 refills | Status: DC

## 2021-01-02 MED ORDER — TRAZODONE HCL 100 MG PO TABS
100.0000 mg | ORAL_TABLET | Freq: Every evening | ORAL | 4 refills | Status: DC | PRN
Start: 2021-01-02 — End: 2021-05-20

## 2021-01-02 NOTE — Telephone Encounter (Signed)
Pt called the after hour nurse 01/01/21 5:07pm stating she needs her meds sent to [facility] at [phone].  This is Paediatric nurse on [address].  I could not get diethylpropion HCL CR 75mg  to go electronically - it printed.  Will try faxing it. DL to ask AMS to sign/fax or do what he can in the system to get it to go thru electronically.

## 2021-01-02 NOTE — Telephone Encounter (Signed)
Pt calling; needs to speak to someone about medications that was rx'd yesterday.  [phone]  Pt upset about calling yesterday at 89min til 5 and no one spoke with her; she left msg c after hour nurse; she doesn't understand what's going on with her medications.  Adv I have changed her pharm to [facility] and sent her rxs there.  Pt appreciative.

## 2021-01-02 NOTE — Telephone Encounter (Signed)
Pt calling; wt loss rx is not at the [facility];  Pharm said it could be called in instead of eRx'd.  Please call her a let her know when this is done so she can make another trip to [facility] to p/u.  [phone]

## 2021-01-02 NOTE — Telephone Encounter (Signed)
It was sent to [facility] on [address] when prescribed 12/31/20

## 2021-01-02 NOTE — Telephone Encounter (Signed)
Pt aware rx faxed and went thru.

## 2021-02-09 ENCOUNTER — Ambulatory Visit (INDEPENDENT_AMBULATORY_CARE_PROVIDER_SITE_OTHER): Payer: BC Managed Care – PPO | Admitting: Obstetrics and Gynecology

## 2021-02-09 ENCOUNTER — Other Ambulatory Visit: Payer: Self-pay

## 2021-02-09 ENCOUNTER — Encounter: Payer: Self-pay | Admitting: Obstetrics and Gynecology

## 2021-02-09 ENCOUNTER — Other Ambulatory Visit (HOSPITAL_COMMUNITY)
Admission: RE | Admit: 2021-02-09 | Discharge: 2021-02-09 | Disposition: A | Discharge status: Home / Self Care | Payer: BC Managed Care – PPO | Source: Ambulatory Visit | Attending: Obstetrics and Gynecology | Admitting: Obstetrics and Gynecology

## 2021-02-09 VITALS — BP 120/82 | Ht 61.0 in | Wt 221.0 lb

## 2021-02-09 DIAGNOSIS — Z1239 Encounter for other screening for malignant neoplasm of breast: Secondary | ICD-10-CM

## 2021-02-09 DIAGNOSIS — Z124 Encounter for screening for malignant neoplasm of cervix: Secondary | ICD-10-CM | POA: Insufficient documentation

## 2021-02-09 DIAGNOSIS — Z01419 Encounter for gynecological examination (general) (routine) without abnormal findings: Secondary | ICD-10-CM | POA: Diagnosis not present

## 2021-02-09 DIAGNOSIS — Z6841 Body Mass Index (BMI) 40.0 and over, adult: Secondary | ICD-10-CM

## 2021-02-09 MED ORDER — PHENTERMINE HCL 37.5 MG PO TABS: 37.5000 mg | ORAL_TABLET | Freq: Every day | ORAL | 0 refills | Status: DC

## 2021-02-09 MED ORDER — PHENTERMINE HCL 37.5 MG PO TABS
37.5000 mg | ORAL_TABLET | Freq: Every day | ORAL | 0 refills | Status: DC
Start: 2021-02-09 — End: 2021-03-10

## 2021-02-09 NOTE — Progress Notes (Signed)
Gynecology Annual Exam  PCP: Martin Majestic, FNP  Chief Complaint:  Chief Complaint  Patient presents with  . Gynecologic Exam    Annual - hot flashes really bad. RM 5    History of Present Illness:Patient is a 55 y.o. G0P0000 presents for annual exam. The patient has no complaints today.   LMP: No LMP recorded. Patient is postmenopausal. No postmenopausal bleeding.  Denies breast masses, tenderness, or nipple discharge.  Mammogram done at [facility] and up to date.  There is no notable family history of breast or ovarian cancer in her family.  The patient wears seatbelts: yes.   The patient has regular exercise: yes.  Still working on weight loss in order to facilitate hip replacement.  Did not note significant appetite suppression or weight loss on diethylpropion.    Continues to report vasomotor symptoms.  Not an estrogen candidate given smoking and HTN history.  The patient denies current symptoms of depression.     Review of Systems: Review of Systems  Constitutional: Negative for chills and fever.  HENT: Negative for congestion.   Respiratory: Negative for cough and shortness of breath.   Cardiovascular: Negative for chest pain and palpitations.  Gastrointestinal: Negative for abdominal pain, constipation, diarrhea, heartburn, nausea and vomiting.  Genitourinary: Negative for dysuria, frequency and urgency.  Skin: Negative for itching and rash.  Neurological: Negative for dizziness and headaches.  Endo/Heme/Allergies: Negative for polydipsia.  Psychiatric/Behavioral: Negative for depression.    Past Medical History:  Patient Active Problem List   Diagnosis Date Noted  . Primary osteoarthritis of right knee 11/23/2019  . Benign neoplasm of cecum   . Special screening for malignant neoplasms, colon   . Primary osteoarthritis of left knee 05/17/2016  . Tendinitis of right rotator cuff 05/17/2016  . Osteoarthritis of left shoulder 05/09/2015  . TOBACCO ABUSE  06/20/2008    Qualifier: History of  By: Marinell Blight, Dawn     . CONSTIPATION 06/18/2008    Qualifier: Diagnosis of  By: Earley Favor MD, Cat     . HEART MURMUR, BENIGN 06/18/2008    Qualifier: Diagnosis of  By: Ta MD, Cat     . OTHER RESPIRATORY COMPLICATIONS 40/34/7425    Qualifier: Diagnosis of  By: Earley Favor MD, Cat       Past Surgical History:  Past Surgical History:  Procedure Laterality Date  . APPLICATION OF WOUND VAC Right 05/20/2020   Procedure: APPLICATION OF WOUND VAC TO RIGHT FOOT;  Surgeon: Erle Crocker, MD;  Location: [place];  Service: Orthopedics;  Laterality: Right;  . COLONOSCOPY WITH PROPOFOL N/A 09/03/2016   Procedure: COLONOSCOPY WITH PROPOFOL;  Surgeon: [name], MD;  Location: [facility];  Service: Endoscopy;  Laterality: N/A;  . FRACTURE SURGERY     left foot   . INCISION AND DRAINAGE Right 05/20/2020   Procedure: RIGHT FOOT INCISION AND DRAINAGE;  Surgeon: Erle Crocker, MD;  Location: [place];  Service: Orthopedics;  Laterality: Right;  LENGTH OF SURGERY: 1 HOUR  . KNEE CARTILAGE SURGERY  2015   left  . LAPAROSCOPIC SALPINGOOPHERECTOMY    . left hand     1 for ganglion cyst and next yr replace "joints" in left hand  . PLANTAR FASCIA SURGERY     left foot  . SHOULDER INJECTION Right 05/17/2016   Procedure: SHOULDER INJECTION;  Surgeon: Dorna Leitz, MD;  Location: [place];  Service: Orthopedics;  Laterality: Right;  . SHOULDER SURGERY  x 2 on right shoulder  . TONSILLECTOMY    . TOTAL KNEE ARTHROPLASTY Left 05/17/2016   Procedure: TOTAL KNEE ARTHROPLASTY;  Surgeon: Dorna Leitz, MD;  Location: [place];  Service: Orthopedics;  Laterality: Left;  . TOTAL KNEE ARTHROPLASTY Right 11/23/2019   Procedure: TOTAL KNEE ARTHROPLASTY;  Surgeon: Dorna Leitz, MD;  [facility];  Service: Orthopedics;  Laterality: Right;  . TOTAL SHOULDER ARTHROPLASTY Left 05/09/2015   Procedure: TOTAL SHOULDER ARTHROPLASTY;  Surgeon: Dorna Leitz, MD;  Location: [place];   Service: Orthopedics;  Laterality: Left;  . TUBAL LIGATION     tubes removed stated patient    Gynecologic History:  No LMP recorded. Patient is postmenopausal. Last Pap: Results were: 12/14/2017 NIL and HR HPV negative  Last mammogram: 01/28/2021 Results were: BI-RAD I  Obstetric History: G0P0000  Family History:  Family History  Problem Relation Age of Onset  . Heart attack Father   . Stroke Father     Social History:  Social History   Socioeconomic History  . Marital status: Single    Spouse name: Not on file  . Number of children: Not on file  . Years of education: Not on file  . Highest education level: Not on file  Occupational History  . Not on file  Tobacco Use  . Smoking status: Current Every Day Smoker    Packs/day: 1.00    Years: 30.00    Pack years: 30.00    Types: Cigarettes  . Smokeless tobacco: Never Used  Vaping Use  . Vaping Use: Never used  Substance and Sexual Activity  . Alcohol use: Not Currently  . Drug use: No  . Sexual activity: Yes    Birth control/protection: None  Other Topics Concern  . Not on file  Social History Narrative  . Not on file   Social Determinants of Health   Financial Resource Strain: Not on file  Food Insecurity: Not on file  Transportation Needs: Not on file  Physical Activity: Not on file  Stress: Not on file  Social Connections: Not on file  Intimate Partner Violence: Not on file    Allergies:  Allergies  Allergen Reactions  . Augmentin [Amoxicillin-Pot Clavulanate] Anaphylaxis    Did it involve swelling of the face/tongue/throat, SOB, or low BP? Yes Did it involve sudden or severe rash/hives, skin peeling, or any reaction on the inside of your mouth or nose? No Did you need to seek medical attention at a hospital or doctor's office? Yes When did it last happen?15 years ago If all above answers are "NO", may proceed with cephalosporin use.   . Bee Venom Hives and Shortness Of Breath    Treats with  benadryl  . Diclofenac Sodium Itching and Swelling    Can tolerate Voltaren  Cannot use PENNSAID- topical gel  . Other Hives and Shortness Of Breath    Reaction to spider bites (treats with benadryl)  . Sulfa Antibiotics Anaphylaxis and Other (See Comments)    Childhood allergic reaction Sulfonamides    Medications: Prior to Admission medications   Medication Sig Start Date End Date Taking? Authorizing Provider  b complex vitamins tablet Take 1 tablet by mouth daily.   Yes [provider]  cholecalciferol (VITAMIN D) 1000 units tablet Take 1,000 Units by mouth daily.    Yes [provider]  diclofenac Sodium (VOLTAREN) 1 % GEL Apply topically. 02/06/21  Yes [provider]  fluticasone (FLONASE) 50 MCG/ACT nasal spray Place 1 spray into both nostrils  daily.   Yes [provider]  levofloxacin (LEVAQUIN) 500 MG tablet Take 500 mg by mouth daily. 10/13/20  Yes [provider]  losartan (COZAAR) 100 MG tablet Take 100 mg by mouth daily.   Yes [provider]  Multiple Vitamin (MULTIVITAMIN WITH MINERALS) TABS tablet Take 1 tablet by mouth daily. 50 +   Yes [provider]  NARCAN 4 MG/0.1ML LIQD nasal spray kit SMARTSIG:1 Both Nares Daily PRN 05/21/20  Yes [provider]  oxyCODONE-acetaminophen (PERCOCET) 10-325 MG tablet SMARTSIG:1 By Mouth 4-5 Times Daily 08/10/20  Yes [provider]  torsemide (DEMADEX) 10 MG tablet Take 1 tablet (10 mg total) by mouth daily. 09/01/20  Yes Kate Sable, MD  traZODone (DESYREL) 100 MG tablet Take 1 tablet (100 mg total) by mouth at bedtime as needed for sleep. 01/02/21  Yes [name], MD  venlafaxine XR (EFFEXOR-XR) 150 MG 24 hr capsule Take 1 capsule (150 mg total) by mouth daily. 01/02/21  Yes [name], MD  Diethylpropion HCl CR 75 MG TB24 Take 1 tablet (75 mg total) by mouth daily before breakfast. Patient not taking: Reported on 02/09/2021 01/02/21    [name], MD  gabapentin (NEURONTIN) 600 MG tablet Take 600 mg by mouth 3 (three) times daily. Patient not taking: Reported on 11/27/2020 07/26/20   [provider]  hydrOXYzine (ATARAX/VISTARIL) 25 MG tablet Take 25 mg by mouth 2 (two) times daily. 08/12/20   [provider]  ibuprofen (ADVIL) 800 MG tablet Take 800 mg by mouth every 8 (eight) hours as needed for moderate pain.    [provider]  umeclidinium-vilanterol (ANORO ELLIPTA) 62.5-25 MCG/INH AEPB Inhale 1 puff into the lungs daily. 09/26/20   Hunsucker, Bonna Gains, MD  VENTOLIN HFA 108 (90 Base) MCG/ACT inhaler Inhale 2 puffs into the lungs every 4 (four) hours as needed for wheezing or shortness of breath.  03/19/20   [provider]    Physical Exam Vitals: Blood pressure 120/82, height 5' 1" (1.549 m), weight 221 lb (100.2 kg). Body mass index is 41.76 kg/m.    General: NAD HEENT: normocephalic, anicteric Thyroid: no enlargement, no palpable nodules Pulmonary: No increased work of breathing, CTAB Cardiovascular: RRR, distal pulses 2+ Breast: Breast symmetrical, no tenderness, no palpable nodules or masses, no skin or nipple retraction present, no nipple discharge.  No axillary or supraclavicular lymphadenopathy. Abdomen: NABS, soft, non-tender, non-distended.  Umbilicus without lesions.  No hepatomegaly, splenomegaly or masses palpable. No evidence of hernia  Genitourinary:  External: Normal external female genitalia.  Normal urethral meatus, normal Bartholin's and Skene's glands.    Vagina: Normal vaginal mucosa, no evidence of prolapse.    Cervix: Grossly normal in appearance, no bleeding  Uterus: Non-enlarged, mobile, normal contour.  No CMT  Adnexa: ovaries non-enlarged, no adnexal masses  Rectal: deferred  Lymphatic: no evidence of inguinal lymphadenopathy Extremities: no edema, erythema, or tenderness Neurologic: Grossly intact Psychiatric: mood appropriate, affect  full  Female chaperone present for pelvic and breast  portions of the physical exam     Assessment: 55 y.o. G0P0000 routine annual exam  Plan: Problem List Items Addressed This Visit   None   Visit Diagnoses    Encounter for gynecological examination without abnormal finding    -  Primary   Breast screening       Cervical cancer screening       Relevant Orders   Cytology - PAP   Class 3 severe obesity with serious comorbidity and body  mass index (BMI) of 40.0 to 44.9 in adult, unspecified obesity type ([facility])       Relevant Medications   phentermine (ADIPEX-P) 37.5 MG tablet      1) Mammogram - recommend yearly screening mammogram.  Mammogram Is up to date  2) STI screening  was notoffered and therefore not obtained  3) ASCCP guidelines and rational discussed.  Patient opts for every 3 years screening interval  4) Osteoporosis  - per USPTF routine screening DEXA at age 57 - FRAX 60 year major fracture risk 3.8%,  10 year hip fracture risk 0.2%  Consider FDA-approved medical therapies in postmenopausal women and men aged 67 years and older, based on the following: a) A hip or vertebral (clinical or morphometric) fracture b) T-score ? -2.5 at the femoral neck or spine after appropriate evaluation to exclude secondary causes C) Low bone mass (T-score between -1.0 and -2.5 at the femoral neck or spine) and a 10-year probability of a hip fracture ? 3% or a 10-year probability of a major osteoporosis-related fracture ? 20% based on the [place]-adapted WHO algorithm   5) Routine healthcare maintenance including cholesterol, diabetes screening discussed managed by PCP  6) Colonoscopy - UTD 09/03/2016  7) Weight loss - switch back to phentermine  8)  Return in about 4 weeks (around 03/09/2021) for medication follow up phone.    [name], MD [facility], [facility] 02/09/2021, 10:09 AM

## 2021-02-11 LAB — CYTOLOGY - PAP
Adequacy: ABSENT
Comment: NEGATIVE
Diagnosis: NEGATIVE
Diagnosis: REACTIVE
High risk HPV: NEGATIVE

## 2021-03-10 ENCOUNTER — Ambulatory Visit (INDEPENDENT_AMBULATORY_CARE_PROVIDER_SITE_OTHER): Payer: BC Managed Care – PPO | Admitting: Obstetrics and Gynecology

## 2021-03-10 DIAGNOSIS — R61 Generalized hyperhidrosis: Secondary | ICD-10-CM

## 2021-03-10 DIAGNOSIS — Z6841 Body Mass Index (BMI) 40.0 and over, adult: Secondary | ICD-10-CM | POA: Diagnosis not present

## 2021-03-10 DIAGNOSIS — N951 Menopausal and female climacteric states: Secondary | ICD-10-CM

## 2021-03-10 MED ORDER — CLONIDINE HCL 0.1 MG PO TABS: 0.1000 mg | ORAL_TABLET | Freq: Two times a day (BID) | ORAL | 3 refills | Status: DC

## 2021-03-10 MED ORDER — PHENTERMINE HCL 37.5 MG PO TABS
37.5000 mg | ORAL_TABLET | Freq: Every day | ORAL | 0 refills | Status: DC
Start: 2021-03-10 — End: 2021-05-20

## 2021-03-10 NOTE — Progress Notes (Signed)
I connected with [name] on 03/16/21 at  1:50 PM EDT by telephone and verified that I am speaking with the correct person using two identifiers.   I discussed the limitations, risks, security and privacy concerns of performing an evaluation and management service by telephone and the availability of in person appointments. I also discussed with the patient that there may be a patient responsible charge related to this service. The patient expressed understanding and agreed to proceed.  The patient was at home. I spoke with the patient from my workstation phone. The names of people involved in this encounter were: [name] , and [name]  Gynecology Office Visit  Chief Complaint:  Chief Complaint  Patient presents with  . Follow-up    Phone visit -     History of Present Illness: Patientis a 55 y.o. [name] female, who presents for the evaluation of the desire to lose weight. She has gained 7lbs in the past month.  She has not taken phentermine or any stimulant weight loss medication in the past month, has actually cut out caffeine as well to see if this would cause any improvement in her hot flashes and sweating.    Given her smoking and HTN history not a candidate for HRT given increased risk of DVT/PE, and cardiovascular events.  She is currently being managed with Effexor XR 186m.  She still reports  Vasomotor symptoms despite Effexor XR.  She had a TSH checked which was normal 12/14/2017 at initial presentation/evaluation for her vasomotor symptoms.  She has been on gabapentin in the past for orthopedic related issues but did not note significant improvement on this either.  She is currently on losartan and torsemide for her blood pressure and reports systolics usually in the 1998-338range on this regimen.     Review of Systems: 10 point review of systems negative unless otherwise noted in HPI  Past Medical History:  Past Medical History:  Diagnosis Date  .  Arthritis   . COPD (chronic obstructive pulmonary disease) ([place]    denies  . COVID-19 10/23/2019   Asymptomatic  . Environmental and seasonal allergies   . Fibromyalgia   . History of colon polyps 2017  . Hypertension   . Pneumonia    aspiration PNA  . Wound infection    right foot    Past Surgical History:  Past Surgical History:  Procedure Laterality Date  . APPLICATION OF WOUND VAC Right 05/20/2020   Procedure: APPLICATION OF WOUND VAC TO RIGHT FOOT;  Surgeon: AErle Crocker MD;  [place] [facility]  Service: Orthopedics;  Laterality: Right;  . COLONOSCOPY WITH PROPOFOL N/A 09/03/2016   Procedure: COLONOSCOPY WITH PROPOFOL;  Surgeon: KJonathon Bellows MD;  Location: [facility];  Service: Endoscopy;  Laterality: N/A;  . FRACTURE SURGERY     left foot   . INCISION AND DRAINAGE Right 05/20/2020   Procedure: RIGHT FOOT INCISION AND DRAINAGE;  Surgeon: AErle Crocker MD;  [place] [facility]  Service: Orthopedics;  Laterality: Right;  LENGTH OF SURGERY: 1 HOUR  . KNEE CARTILAGE SURGERY  2015   left  . LAPAROSCOPIC SALPINGOOPHERECTOMY    . left hand     1 for ganglion cyst and next yr replace "joints" in left hand  . PLANTAR FASCIA SURGERY     left foot  . SHOULDER INJECTION Right 05/17/2016   Procedure: SHOULDER INJECTION;  Surgeon: JDorna Leitz MD;  [place] [facility]  Service: Orthopedics;  Laterality: Right;  .  SHOULDER SURGERY     x 2 on right shoulder  . TONSILLECTOMY    . TOTAL KNEE ARTHROPLASTY Left 05/17/2016   Procedure: TOTAL KNEE ARTHROPLASTY;  Surgeon: Dorna Leitz, MD;  Location: [place];  Service: Orthopedics;  Laterality: Left;  . TOTAL KNEE ARTHROPLASTY Right 11/23/2019   Procedure: TOTAL KNEE ARTHROPLASTY;  Surgeon: Dorna Leitz, MD;  Location: [facility];  Service: Orthopedics;  Laterality: Right;  . TOTAL SHOULDER ARTHROPLASTY Left 05/09/2015   Procedure: TOTAL SHOULDER ARTHROPLASTY;  Surgeon: Dorna Leitz, MD;  Location: [place];  Service: Orthopedics;  Laterality:  Left;  . TUBAL LIGATION     tubes removed stated patient    Gynecologic History: No LMP recorded. Patient is postmenopausal.  Obstetric History: G0P0000  Family History:  Family History  Problem Relation Age of Onset  . Heart attack Father   . Stroke Father     Social History:  Social History   Socioeconomic History  . Marital status: Single    Spouse name: Not on file  . Number of children: Not on file  . Years of education: Not on file  . Highest education level: Not on file  Occupational History  . Not on file  Tobacco Use  . Smoking status: Current Every Day Smoker    Packs/day: 1.00    Years: 30.00    Pack years: 30.00    Types: Cigarettes  . Smokeless tobacco: Never Used  Vaping Use  . Vaping Use: Never used  Substance and Sexual Activity  . Alcohol use: Not Currently  . Drug use: No  . Sexual activity: Yes    Birth control/protection: None  Other Topics Concern  . Not on file  Social History Narrative  . Not on file   Social Determinants of Health   Financial Resource Strain: Not on file  Food Insecurity: Not on file  Transportation Needs: Not on file  Physical Activity: Not on file  Stress: Not on file  Social Connections: Not on file  Intimate Partner Violence: Not on file    Allergies:  Allergies  Allergen Reactions  . Augmentin [Amoxicillin-Pot Clavulanate] Anaphylaxis    Did it involve swelling of the face/tongue/throat, SOB, or low BP? Yes Did it involve sudden or severe rash/hives, skin peeling, or any reaction on the inside of your mouth or nose? No Did you need to seek medical attention at a hospital or doctor's office? Yes When did it last happen?15 years ago If all above answers are "NO", may proceed with cephalosporin use.   . Bee Venom Hives and Shortness Of Breath    Treats with benadryl  . Diclofenac Sodium Itching and Swelling    Can tolerate Voltaren  Cannot use PENNSAID- topical gel  . Other Hives and Shortness Of  Breath    Reaction to spider bites (treats with benadryl)  . Sulfa Antibiotics Anaphylaxis and Other (See Comments)    Childhood allergic reaction Sulfonamides    Medications: Prior to Admission medications   Medication Sig Start Date End Date Taking? Authorizing Provider  b complex vitamins tablet Take 1 tablet by mouth daily.    [provider]  cholecalciferol (VITAMIN D) 1000 units tablet Take 1,000 Units by mouth daily.     [provider]  diclofenac Sodium (VOLTAREN) 1 % GEL Apply topically. 02/06/21   [provider]  fluticasone (FLONASE) 50 MCG/ACT nasal spray Place 1 spray into both nostrils daily.    [provider]  hydrOXYzine (ATARAX/VISTARIL) 25 MG tablet  Take 25 mg by mouth 2 (two) times daily. 08/12/20   [provider]  ibuprofen (ADVIL) 800 MG tablet Take 800 mg by mouth every 8 (eight) hours as needed for moderate pain.    [provider]  levofloxacin (LEVAQUIN) 500 MG tablet Take 500 mg by mouth daily. 10/13/20   [provider]  losartan (COZAAR) 100 MG tablet Take 100 mg by mouth daily.    [provider]  Multiple Vitamin (MULTIVITAMIN WITH MINERALS) TABS tablet Take 1 tablet by mouth daily. 50 +    [provider]  NARCAN 4 MG/0.1ML LIQD nasal spray kit SMARTSIG:1 Both Nares Daily PRN 05/21/20   [provider]  oxyCODONE-acetaminophen (PERCOCET) 10-325 MG tablet SMARTSIG:1 By Mouth 4-5 Times Daily 08/10/20   [provider]  phentermine (ADIPEX-P) 37.5 MG tablet Take 1 tablet (37.5 mg total) by mouth daily before breakfast. 02/09/21   [name], MD  torsemide (DEMADEX) 10 MG tablet Take 1 tablet (10 mg total) by mouth daily. 09/01/20   Kate Sable, MD  traZODone (DESYREL) 100 MG tablet Take 1 tablet (100 mg total) by mouth at bedtime as needed for sleep. 01/02/21   [name], MD  umeclidinium-vilanterol [facility] ELLIPTA) 62.5-25 MCG/INH AEPB Inhale 1 puff  into the lungs daily. 09/26/20   Hunsucker, Bonna Gains, MD  venlafaxine XR (EFFEXOR-XR) 150 MG 24 hr capsule Take 1 capsule (150 mg total) by mouth daily. 01/02/21   [name], MD  VENTOLIN HFA 108 [phone] Base) MCG/ACT inhaler Inhale 2 puffs into the lungs every 4 (four) hours as needed for wheezing or shortness of breath.  03/19/20   [provider]    Physical Exam Vitals:  Wt Readings from Last 3 Encounters:  02/09/21 221 lb (100.2 kg)  01/01/21 214 lb (97.1 kg)  11/27/20 208 lb (94.3 kg)  There is no height or weight on file to calculate BMI.  No physical exam as this was a remote telephone visit to promote social distancing during the current COVID-19 Pandemic   Assessment: 55 y.o. G0P0000 follow up medical weight loss, vasomotor symptoms, and hyperhydrosis   Plan: Problem List Items Addressed This Visit   None   Visit Diagnoses    Hyperhidrosis    -  Primary   Relevant Orders   Ambulatory referral to Dermatology   Class 3 severe obesity without serious comorbidity with body mass index (BMI) of 40.0 to 44.9 in adult, unspecified obesity type ([place])       Relevant Medications   phentermine (ADIPEX-P) 37.5 MG tablet   Vasomotor symptoms due to menopause          1) 1500 Calorie ADA Diet  2) Patient education given regarding appropriate lifestyle changes for weight loss including: regular physical activity, healthy coping strategies, caloric restriction and healthy eating patterns.  3) Patient will be started on weight loss medication. The risks and benefits and side effects of medication, such as Adipex (Phenteramine) ,  Tenuate (Diethylproprion), Belviq (lorcarsin), Contrave (buproprion/naltrexone), Qsymia (phentermine/topiramate), and Saxenda (liraglutide) is discussed. The pros and cons of suppressing appetite and boosting metabolism is discussed. Risks of tolerence and addiction is discussed for selected agents discussed. Use of medicine will ne short term, such  as 3-4 months at a time followed by a period of time off of the medicine to avoid these risks and side effects for Adipex, Qsymia, and Tenuate discussed. Pt to call with any negative side effects and agrees to keep follow up appts.  4)  Patient to take medication, with the benefits of appetite suppression and metabolism boost d/w pt, along with the side effects and risk factors of long term use that will be avoided with our use of short bursts of therapy. Rx provided.   - has not taken phentermine  5)  Vasomotor symptoms and excessive sweating - not HRT candidate given smoking and HTN - continue Effexor and add clonodine -- hyperhidrosis dermatology  5) Telephone time 25:40  6)  No follow-ups on file.    [name], MD, [facility] 03/10/2021, 2:04 PM

## 2021-03-26 ENCOUNTER — Encounter: Payer: Self-pay | Admitting: Student in an Organized Health Care Education/Training Program

## 2021-03-31 ENCOUNTER — Ambulatory Visit: Payer: BC Managed Care – PPO | Admitting: Student in an Organized Health Care Education/Training Program

## 2021-04-15 ENCOUNTER — Other Ambulatory Visit: Payer: Self-pay | Admitting: Orthopedic Surgery

## 2021-04-15 DIAGNOSIS — Z01811 Encounter for preprocedural respiratory examination: Secondary | ICD-10-CM

## 2021-04-17 ENCOUNTER — Telehealth: Payer: Self-pay | Admitting: Cardiology

## 2021-04-17 NOTE — Telephone Encounter (Signed)
Preoperative cardiac evaluation form incompleted.  Please include details surrounding procedure.  Thank you.  [name]. April Sarsfield NP-C    04/17/2021, 1:46 PM [facility] [address] Office [phone] Fax [phone]

## 2021-04-17 NOTE — Telephone Encounter (Signed)
I have reviewed the [facility] schedule to set up a pre op appt with Dr. [name] or APP. No available appts. I will send a message to the [facility] to see if they can ask providers where they may be able to squeeze pt in for pre op clearance. Procedure is scheduled at this time for 05/08/21.

## 2021-04-17 NOTE — Telephone Encounter (Signed)
Primary Cardiologist:Brian Agbor-Etang, MD  Chart reviewed as part of pre-operative protocol coverage. Because of [name]'s past medical history and time since last visit, he/she will require a follow-up visit in order to better assess preoperative cardiovascular risk.  Pre-op covering staff: - Please schedule appointment and call patient to inform them. - Please contact requesting surgeon's office via preferred method (i.e, phone, fax) to inform them of need for appointment prior to surgery.  If applicable, this message will also be routed to pharmacy pool and/or primary cardiologist for input on holding anticoagulant/antiplatelet agent as requested below so that this information is available at time of patient's appointment.   Deberah Pelton, NP  04/17/2021, 3:11 PM

## 2021-04-17 NOTE — Telephone Encounter (Addendum)
   [facility] Pre-operative Risk Assessment    Patient Name: [name]  DOB: 1966-06-01  MRN: [id number]   HEARTCARE STAFF: - Please ensure there is not already an duplicate clearance open for this procedure. - Under Visit Info/Reason for Call, type in Other and utilize the format Clearance MM/DD/YY or Clearance TBD. Do not use dashes or single digits. - If request is for dental extraction, please clarify the # of teeth to be extracted. - If the patient is currently at the dentist's office, call Pre-Op APP to address. If the patient is not currently in the dentist office, please route to the Pre-Op pool  Request for surgical clearance:  What type of surgery is being performed? L total hip arthroplasty    When is this surgery scheduled? 05-08-21   What type of clearance is required (medical clearance vs. Pharmacy clearance to hold med vs. Both)? both  Are there any medications that need to be held prior to surgery and how long?  Please advise   Practice name and name of physician performing surgery?  [facility]   What is the office phone number? [phone]   7.   What is the office fax number? [phone]  8.   Anesthesia type (None, local, MAC, general) ?  spinal   Clarisse Gouge 04/17/2021, 1:05 PM  _________________________________________________________________   (provider comments below)

## 2021-04-20 NOTE — Telephone Encounter (Signed)
I called the pt to advise she will need an appt for pre op clearance. Pt became very upset and started crying. Pt states Dr. [name] cleared her back in January 2022. I do see a clearance then, though was for shoulder surgery. Pt is now having hip surgery 05/08/21. Pt began crying and raising her voice saying she cannot afford another doctors visit, that she has paid out over $16 K for a number of surgeries she has had this year. Pt said she saw Dr. [name] for swelling and he could not find what was wrong and that she had a number test done for him and everything was fine. She said she does not have a problem with her heart and her PCP referred her to Cardiology, Pulmonology and states that no one can find anything wrong. Pt reluctantly agreed to appt 6/16 @ 3 pm for pre op clearance. Pt used profanity about the whole situation. I assured the pt that I am just following protocol. Pt asked what did Dr. [name] say about all of this. I assured the pt that I send a message to Dr. [name] as well as to our pre op team for any further advice.

## 2021-04-21 NOTE — Telephone Encounter (Signed)
    [name] DOB:  [birth year]  MRN:  [id number]   Primary Cardiologist: Kate Sable, MD  Chart reviewed as part of pre-operative protocol coverage. Given past medical history and time since last visit, based on ACC/AHA guidelines, [name] would be at acceptable risk for the planned procedure without further cardiovascular testing.   She last saw Dr. Garen Lah 09/2020 at which time she was doing well. She has done well since last seen and is able to perform 4 METS or greater without difficulty. Her last echocardiogram showed normal systolic function/ejection fraction, and the patient is without any symptoms or concerns of angina therefore it will be acceptable to proceed with surgery from a cardiac perspective without additional testing per Dr. Mylo Red- [name].   The patient was advised that if she develops new symptoms prior to surgery to contact our office to arrange for a follow-up visit, and she verbalized understanding.  I will route this recommendation to the requesting party via Epic fax function and remove from pre-op pool.  Please call with questions.  Kathyrn Drown, NP 04/21/2021, 11:05 AM

## 2021-04-21 NOTE — Telephone Encounter (Signed)
Dr. [name] sent me a message that he has cleared the pt for her upcoming surgery. I have called the pt and informed her that Dr. [name] cleared her and that I will cancel the 04/23/21 appt that we had made for pre op clearance. Pt is so very grateful for all of our help. I assured the pt that we will send over the clearance notes to the surgeon's office.

## 2021-04-23 ENCOUNTER — Ambulatory Visit: Payer: BC Managed Care – PPO | Admitting: Medical

## 2021-04-24 IMAGING — MR MR FOOT*R* W/O CM
5 series · 32 of 40 positions shown · non-contrast
Comparison: Radiographs dated 02/06/2019

CLINICAL DATA: Sesamoiditis. Pain in the ball of the foot adjacent
to the great toe.

EXAM:
MRI OF THE RIGHT FOREFOOT WITHOUT CONTRAST
TECHNIQUE: Multiplanar, multisequence MR imaging of the right forefoot was
performed. No intravenous contrast was administered.

[Series 4: T1 · coronal · right · 3.0mm · 0.38mm/px · 8 of 40 slices shown (1 of 2)]
[im 1/40]
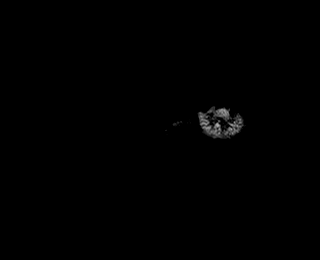
[im 5/40]
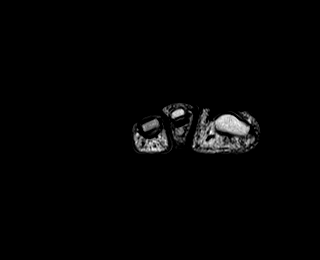
[im 14/40]
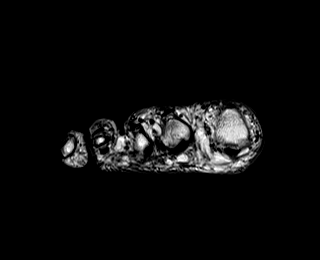
[im 18/40]
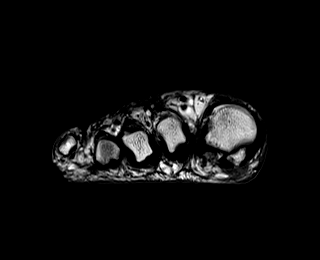
[im 22/40]
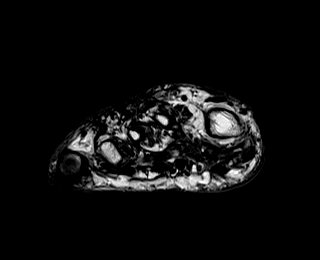
[im 27/40]
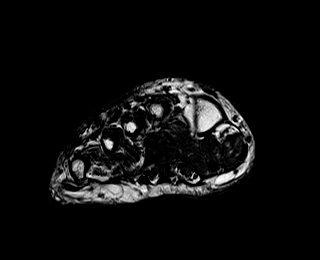
[im 35/40]
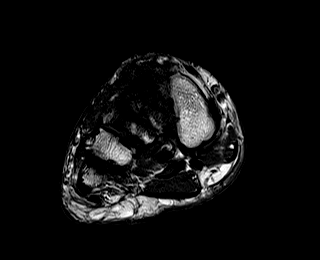
[im 40/40]
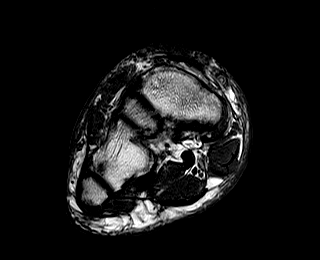

[Series 5: T2 fat-sat · coronal · right · 3.0mm · 0.38mm/px · 11 of 40 slices shown (1 of 2)]
[im 1/40]
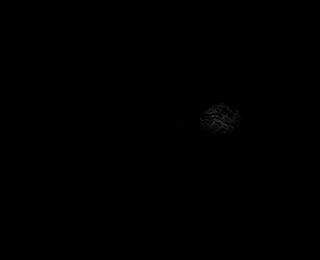
[im 4/40]
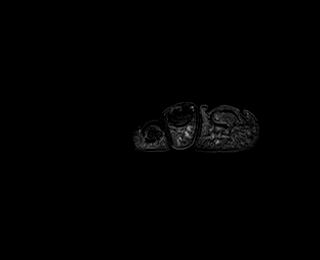
[im 8/40]
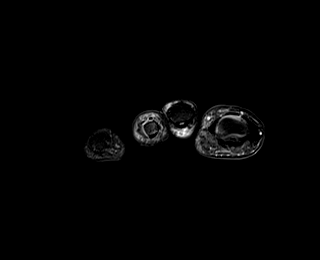
[im 12/40]
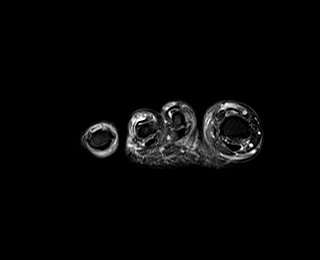
[im 16/40]
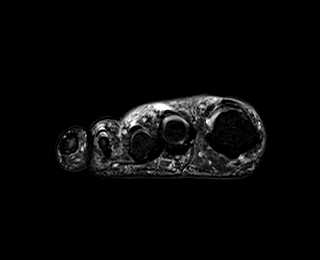
[im 20/40]
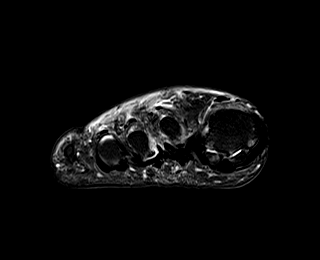
[im 24/40]
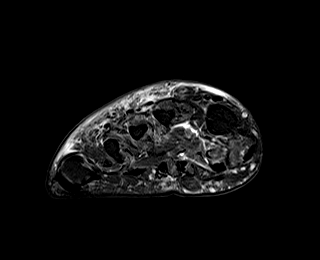
[im 28/40]
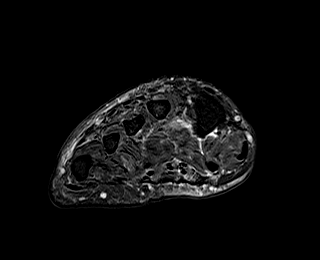
[im 32/40]
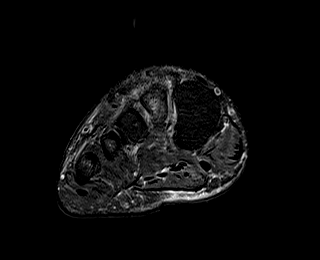
[im 36/40]
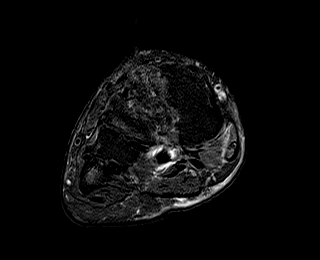
[im 40/40]
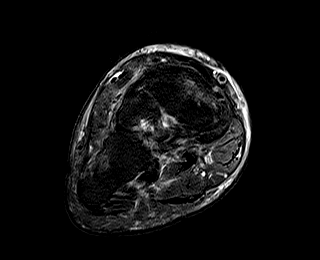

[Series 6: STIR · sagittal · right · 3.0mm · 0.70mm/px · 1 of 25 slices shown]
[im 1/25]
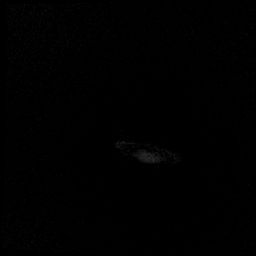

[Series 7: T1 · axial · right · 3.0mm · 0.28mm/px · z∈[-102,-20]mm · 6 of 23 slices shown (2 of 2)]
[im 1/23]
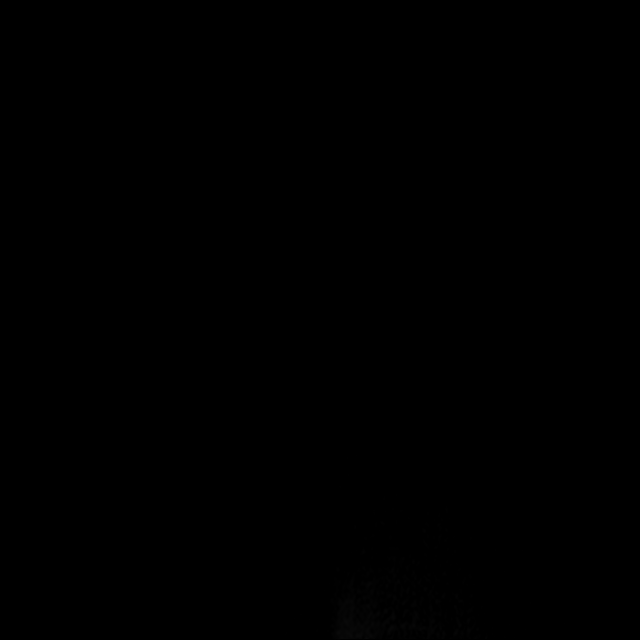
[im 5/23]
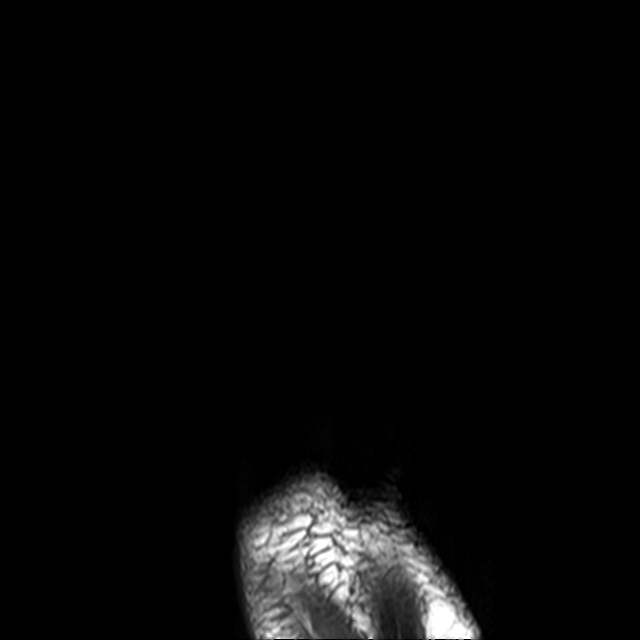
[im 9/23]
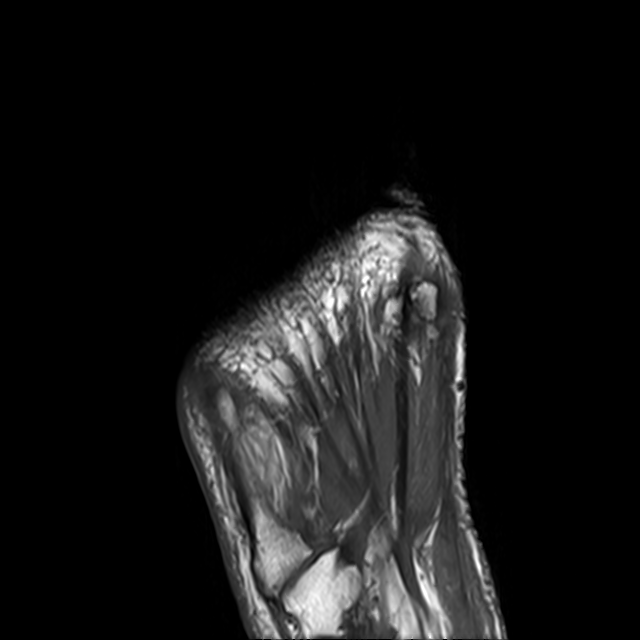
[im 14/23]
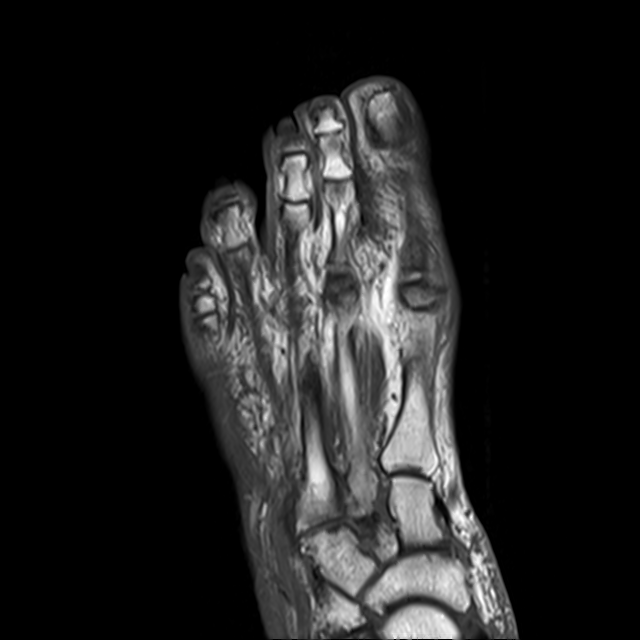
[im 18/23]
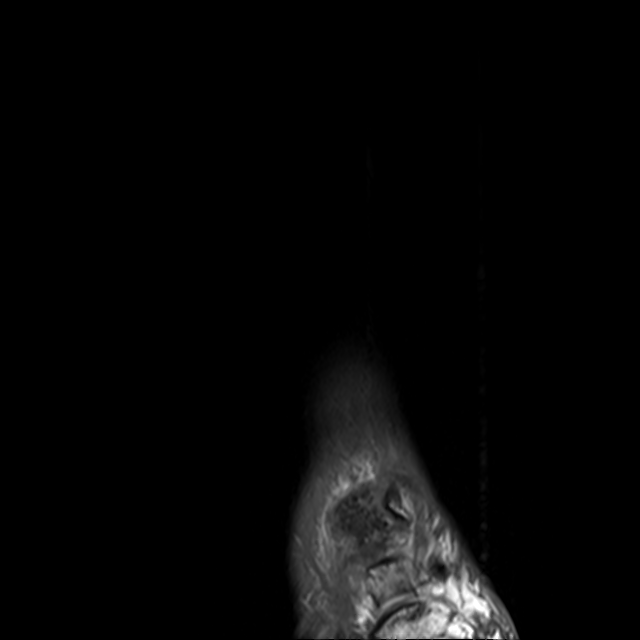
[im 23/23]
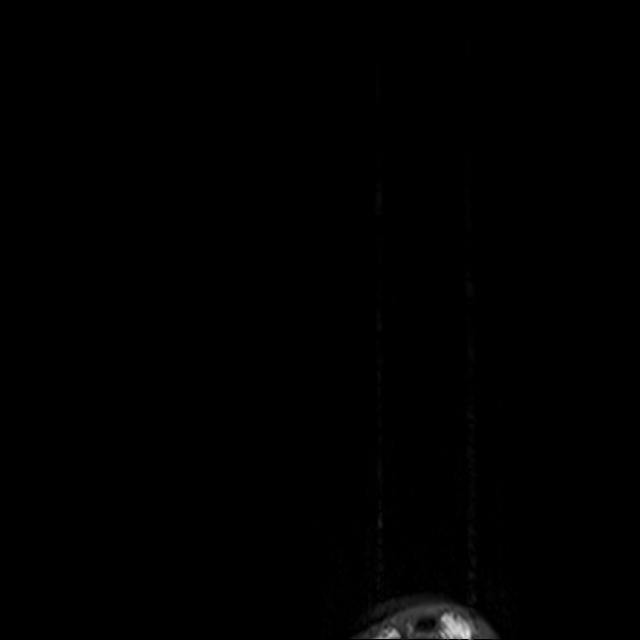

[Series 8: T2 fat-sat · axial · right · 3.0mm · 0.47mm/px · z∈[-102,-20]mm · 6 of 23 slices shown (2 of 2)]
[im 1/23]
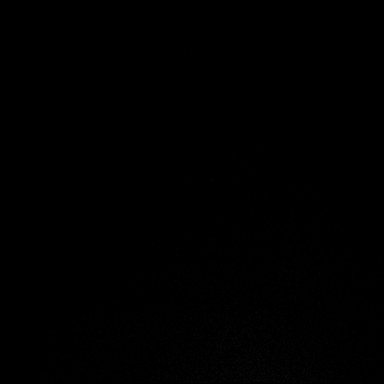
[im 5/23]
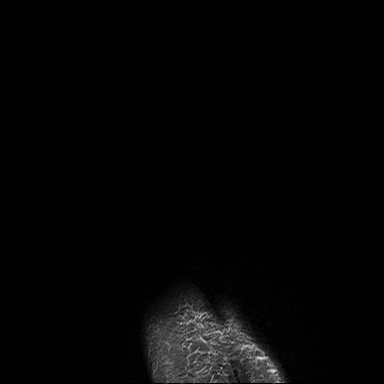
[im 9/23]
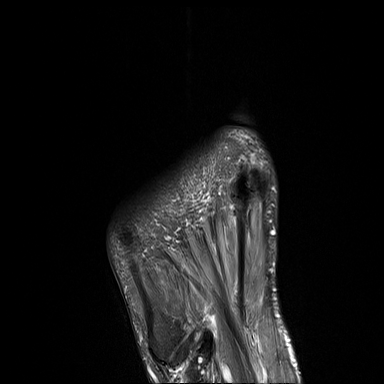
[im 14/23]
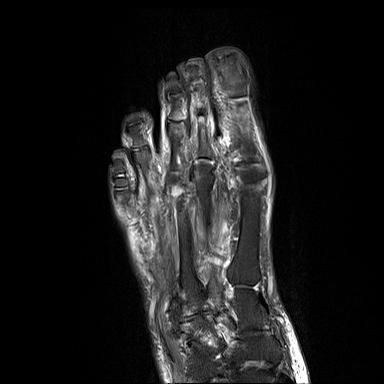
[im 18/23]
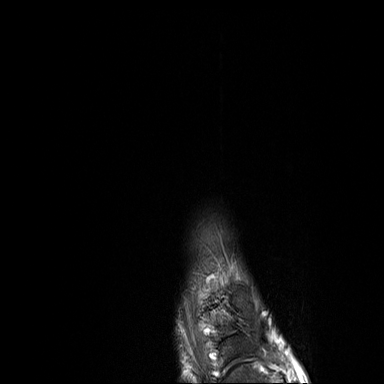
[im 23/23]
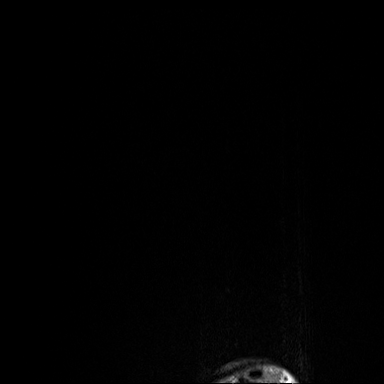

[32 of 40 positions shown; findings below may reference images not displayed]

FINDINGS: Bones/Joint/Cartilage

There is focal slight edema in the lateral sesamoid at the head of
the first metatarsal. There are arthritic changes between the head
of the first metatarsal and the medial and lateral sesamoids. There
is a subcortical cyst in the plantar aspect of the head of the first
metatarsal adjacent to the medial sesamoid. No significant joint
effusion. There are osteophytes at the first MTP joint.

There are severe arthritic changes at the dorsal aspects of the
second tarsal metatarsal joint and to a lesser degree at the third
and fourth tarsal metatarsal joints.

Ligaments

Normal.

Muscles and Tendons

Normal.

Soft tissues

Slight nonspecific edema on the dorsum of the forefoot.
IMPRESSION: 1. Focal edema in the lateral sesamoid at the head of the first
metatarsal consistent with sesamoiditis. Arthritic changes at the
first MTP joint including between the head of the first metatarsal
and both sesamoids.
2. Arthritic changes of the dorsal aspects of the second through
fourth tarsometatarsal joints.
3. No other significant abnormalities.

## 2021-04-24 NOTE — Telephone Encounter (Signed)
Noted  

## 2021-04-27 NOTE — Patient Instructions (Addendum)
DUE TO COVID-19 ONLY ONE VISITOR IS ALLOWED TO COME WITH YOU AND STAY IN THE WAITING ROOM ONLY DURING PRE OP AND PROCEDURE DAY OF SURGERY. THE 1 VISITOR  MAY VISIT WITH YOU AFTER SURGERY IN YOUR PRIVATE ROOM DURING VISITING HOURS ONLY!                [name]  04/27/2021   Your procedure is scheduled on: Friday May 08, 2021   Report to [facility] Main  Entrance   Report to admitting at 0600 AM     Call this number if you have problems the morning of surgery [phone]    Remember: Do not eat food After Midnight. May have clear liquids from midnight till 0515 day of surgery consuming entire Ensure presurgery drink by 0515 then nothing by mouth.   BRUSH YOUR TEETH MORNING OF SURGERY AND RINSE YOUR MOUTH OUT, NO CHEWING GUM CANDY OR MINTS.     Take these medicines the morning of surgery with A SIP OF WATER: Clonidine, may use abulterol inhaler and nebulizer treatment if needed (bring inhaler with you day of procedure), may use flonase nasal spray if needed                               You may not have any metal on your body including hair pins and              piercings  Do not wear jewelry, make-up, lotions, powders or perfumes, deodorant             Do not wear nail polish on your fingernails.  Do not shave  48 hours prior to surgery.             Do not bring valuables to the hospital. [place].  Contacts, dentures or bridgework may not be worn into surgery.  Leave suitcase in the car. After surgery it may be brought to your room.     Patients discharged the day of surgery will not be allowed to drive home. IF YOU ARE HAVING SURGERY AND GOING HOME THE SAME DAY, YOU MUST HAVE AN ADULT TO DRIVE YOU HOME AND BE WITH YOU FOR 24 HOURS. YOU MAY GO HOME BY TAXI OR UBER OR ORTHERWISE, BUT AN ADULT MUST ACCOMPANY YOU HOME AND STAY WITH YOU FOR 24  HOURS.  _____________________________________________________________________                CLEAR LIQUID DIET   Foods Allowed                                                                     Foods Excluded  Coffee and tea, regular and decaf                             liquids that you cannot  Plain Jell-O any favor except red or purple  see through such as: Fruit ices (not with fruit pulp)                                     milk, soups, orange juice  Iced Popsicles                                    All solid food Carbonated beverages, regular and diet                                    Cranberry, grape and apple juices Sports drinks like Gatorade Lightly seasoned clear broth or consume(fat free) Sugar, honey syrup  Sample Menu Breakfast                                Lunch                                     Supper Cranberry juice                    Beef broth                            Chicken broth Jell-O                                     Grape juice                           Apple juice Coffee or tea                        Jell-O                                      Popsicle                                                Coffee or tea                        Coffee or tea  _____________________________________________________________________  Incentive Spirometer  An incentive spirometer is a tool that can help keep your lungs clear and active. This tool measures how well you are filling your lungs with each breath. Taking long deep breaths may help reverse or decrease the chance of developing breathing (pulmonary) problems (especially infection) following: A long period of time when you are unable to move or be active. BEFORE THE PROCEDURE  If the spirometer includes an indicator to show your best effort, your nurse or respiratory therapist will set it to a desired goal. If possible, sit up straight or lean slightly forward.  Try not to slouch. Hold the incentive spirometer in an upright position. INSTRUCTIONS  FOR USE  Sit on the edge of your bed if possible, or sit up as far as you can in bed or on a chair. Hold the incentive spirometer in an upright position. Breathe out normally. Place the mouthpiece in your mouth and seal your lips tightly around it. Breathe in slowly and as deeply as possible, raising the piston or the ball toward the top of the column. Hold your breath for 3-5 seconds or for as long as possible. Allow the piston or ball to fall to the bottom of the column. Remove the mouthpiece from your mouth and breathe out normally. Rest for a few seconds and repeat Steps 1 through 7 at least 10 times every 1-2 hours when you are awake. Take your time and take a few normal breaths between deep breaths. The spirometer may include an indicator to show your best effort. Use the indicator as a goal to work toward during each repetition. After each set of 10 deep breaths, practice coughing to be sure your lungs are clear. If you have an incision (the cut made at the time of surgery), support your incision when coughing by placing a pillow or rolled up towels firmly against it. Once you are able to get out of bed, walk around indoors and cough well. You may stop using the incentive spirometer when instructed by your caregiver.  RISKS AND COMPLICATIONS Take your time so you do not get dizzy or light-headed. If you are in pain, you may need to take or ask for pain medication before doing incentive spirometry. It is harder to take a deep breath if you are having pain. AFTER USE Rest and breathe slowly and easily. It can be helpful to keep track of a log of your progress. Your caregiver can provide you with a simple table to help with this. If you are using the spirometer at home, follow these instructions: [place] IF:  You are having difficultly using the spirometer. You have trouble using the spirometer  as often as instructed. Your pain medication is not giving enough relief while using the spirometer. You develop fever of 100.5 F (38.1 C) or higher. SEEK IMMEDIATE MEDICAL CARE IF:  You cough up bloody sputum that had not been present before. You develop fever of 102 F (38.9 C) or greater. You develop worsening pain at or near the incision site. MAKE SURE YOU:  Understand these instructions. Will watch your condition. Will get help right away if you are not doing well or get worse. Document Released: 03/07/2007 Document Revised: 01/17/2012 Document Reviewed: 05/08/2007 ExitCare Patient Information 2014 [employer].   ________________________________________________________________________ [facility] - Preparing for Surgery Before surgery, you can play an important role.  Because skin is not sterile, your skin needs to be as free of germs as possible.  You can reduce the number of germs on your skin by washing with CHG (chlorahexidine gluconate) soap before surgery.  CHG is an antiseptic cleaner which kills germs and bonds with the skin to continue killing germs even after washing. Please DO NOT use if you have an allergy to CHG or antibacterial soaps.  If your skin becomes reddened/irritated stop using the CHG and inform your nurse when you arrive at Short Stay. Do not shave (including legs and underarms) for at least 48 hours prior to the first CHG shower.  You may shave your face/neck. Please follow these instructions carefully:  1.  Shower with CHG Soap the night before surgery and the  morning of Surgery.  2.  If you choose to wash your hair, wash your hair first as usual with your  normal  shampoo.  3.  After you shampoo, rinse your hair and body thoroughly to remove the  shampoo.                           4.  Use CHG as you would any other liquid soap.  You can apply chg directly  to the skin and wash                       Gently with a scrungie or clean washcloth.  5.  Apply  the CHG Soap to your body ONLY FROM THE NECK DOWN.   Do not use on face/ open                           Wound or open sores. Avoid contact with eyes, ears mouth and genitals (private parts).                       Wash face,  Genitals (private parts) with your normal soap.             6.  Wash thoroughly, paying special attention to the area where your surgery  will be performed.  7.  Thoroughly rinse your body with warm water from the neck down.  8.  DO NOT shower/wash with your normal soap after using and rinsing off  the CHG Soap.                9.  Pat yourself dry with a clean towel.            10.  Wear clean pajamas.            11.  Place clean sheets on your bed the night of your first shower and do not  sleep with pets. Day of Surgery : Do not apply any lotions/deodorants the morning of surgery.  Please wear clean clothes to the hospital/surgery center.  FAILURE TO FOLLOW THESE INSTRUCTIONS MAY RESULT IN THE CANCELLATION OF YOUR SURGERY PATIENT SIGNATURE_________________________________  NURSE SIGNATURE__________________________________  ________________________________________________________________________

## 2021-04-29 ENCOUNTER — Encounter (HOSPITAL_COMMUNITY): Payer: Self-pay

## 2021-04-29 ENCOUNTER — Other Ambulatory Visit: Payer: Self-pay

## 2021-04-29 ENCOUNTER — Encounter (HOSPITAL_COMMUNITY)
Admission: RE | Admit: 2021-04-29 | Discharge: 2021-04-29 | Disposition: A | Discharge status: Home / Self Care | Payer: BC Managed Care – PPO | Source: Ambulatory Visit | Attending: Orthopedic Surgery | Admitting: Orthopedic Surgery

## 2021-04-29 ENCOUNTER — Ambulatory Visit (HOSPITAL_COMMUNITY)
Admission: RE | Admit: 2021-04-29 | Discharge: 2021-04-29 | Disposition: A | Discharge status: Home / Self Care | Payer: BC Managed Care – PPO | Source: Ambulatory Visit | Attending: Orthopedic Surgery | Admitting: Orthopedic Surgery

## 2021-04-29 DIAGNOSIS — Z01811 Encounter for preprocedural respiratory examination: Secondary | ICD-10-CM | POA: Diagnosis present

## 2021-04-29 HISTORY — DX: Unspecified fall, initial encounter: W19.XXXA

## 2021-04-29 HISTORY — DX: Repeated falls: R29.6

## 2021-04-29 LAB — URINALYSIS, ROUTINE W REFLEX MICROSCOPIC
Bilirubin Urine: NEGATIVE
Glucose, UA: NEGATIVE mg/dL
Hgb urine dipstick: NEGATIVE
Ketones, ur: NEGATIVE mg/dL
Leukocytes,Ua: NEGATIVE
Nitrite: NEGATIVE
Protein, ur: NEGATIVE mg/dL
Specific Gravity, Urine: 1.02 (ref 1.005–1.030)
pH: 5 (ref 5.0–8.0)

## 2021-04-29 LAB — COMPREHENSIVE METABOLIC PANEL
ALT: 17 U/L (ref 0–44)
AST: 15 U/L (ref 15–41)
Albumin: 4.2 g/dL (ref 3.5–5.0)
Alkaline Phosphatase: 78 U/L (ref 38–126)
Anion gap: 9 (ref 5–15)
BUN: 14 mg/dL (ref 6–20)
CO2: 29 mmol/L (ref 22–32)
Calcium: 9.6 mg/dL (ref 8.9–10.3)
Chloride: 98 mmol/L (ref 98–111)
Creatinine, Ser: 0.67 mg/dL (ref 0.44–1.00)
GFR, Estimated: >60 mL/min (ref >60)
Glucose, Bld: 97 mg/dL (ref 70–99)
Potassium: 4.6 mmol/L (ref 3.5–5.1)
Sodium: 136 mmol/L (ref 135–145)
Total Bilirubin: 0.4 mg/dL (ref 0.3–1.2)
Total Protein: 7.3 g/dL (ref 6.5–8.1)

## 2021-04-29 LAB — CBC WITH DIFFERENTIAL/PLATELET
Abs Immature Granulocytes: 0.02 10*3/uL (ref 0.00–0.07)
Basophils Absolute: 0 10*3/uL (ref 0.0–0.1)
Basophils Relative: 0 %
Eosinophils Absolute: 0.3 10*3/uL (ref 0.0–0.5)
Eosinophils Relative: 3 %
HCT: 37.7 % (ref 36.0–46.0)
Hemoglobin: 12.4 g/dL (ref 12.0–15.0)
Immature Granulocytes: 0 %
Lymphocytes Relative: 36 %
Lymphs Abs: 3.2 10*3/uL (ref 0.7–4.0)
MCH: 31.8 pg (ref 26.0–34.0)
MCHC: 32.9 g/dL (ref 30.0–36.0)
MCV: 96.7 fL (ref 80.0–100.0)
Monocytes Absolute: 0.8 10*3/uL (ref 0.1–1.0)
Monocytes Relative: 9 %
Neutro Abs: 4.6 10*3/uL (ref 1.7–7.7)
Neutrophils Relative %: 52 %
Platelets: 356 10*3/uL (ref 150–400)
RBC: 3.9 MIL/uL (ref 3.87–5.11)
RDW: 12.3 % (ref 11.5–15.5)
WBC: 8.9 10*3/uL (ref 4.0–10.5)
nRBC: 0 % (ref 0.0–0.2)

## 2021-04-29 LAB — SURGICAL PCR SCREEN
MRSA, PCR: POSITIVE — AB
Staphylococcus aureus: POSITIVE — AB

## 2021-04-29 LAB — PROTIME-INR
INR: 1 (ref 0.8–1.2)
Prothrombin Time: 13.3 seconds (ref 11.4–15.2)

## 2021-04-29 LAB — APTT: aPTT: 32 seconds (ref 24–36)

## 2021-04-29 NOTE — Progress Notes (Signed)
Pts surgical screening from PAT appt 04/29/2021 positive for MRSA and Staph. Results routed to Dr Berenice Primas.

## 2021-04-29 NOTE — Progress Notes (Signed)
PCP - Dr Jimmye Norman Cardiologist - Dr Kate Sable clearance note per epic 04/17/2021  Chest x-ray - preformed PAT visit 04/29/2021 EKG - 09/01/2020  ECHO - per epic 09/17/2020  Blood Thinner Instructions:noted per clearance per Dr Jimmye Norman pt is to stop Ibuprofen3 days prior to surgery and not to resume till 7 days post op (pt aware)  Activity level:  Can go up a flight of stairs and activities of daily living without stopping and without symptoms    Anesthesia review:   Patient denies shortness of breath, fever, cough and chest pain at PAT appointment   Patient verbalized understanding of instructions that were given to them at the PAT appointment. Patient was also instructed that they will need to review over the PAT instructions again at home before surgery.

## 2021-04-30 ENCOUNTER — Encounter (HOSPITAL_COMMUNITY): Payer: BC Managed Care – PPO

## 2021-05-01 ENCOUNTER — Encounter (HOSPITAL_COMMUNITY): Payer: Self-pay | Admitting: Physician Assistant

## 2021-05-08 LAB — TYPE AND SCREEN
ABO/RH(D): O POS
Antibody Screen: NEGATIVE

## 2021-05-20 ENCOUNTER — Ambulatory Visit (INDEPENDENT_AMBULATORY_CARE_PROVIDER_SITE_OTHER): Payer: BC Managed Care – PPO | Admitting: Obstetrics and Gynecology

## 2021-05-20 ENCOUNTER — Encounter: Payer: Self-pay | Admitting: Obstetrics and Gynecology

## 2021-05-20 ENCOUNTER — Other Ambulatory Visit: Payer: Self-pay

## 2021-05-20 VITALS — BP 128/107 | Wt 204.0 lb

## 2021-05-20 DIAGNOSIS — E669 Obesity, unspecified: Secondary | ICD-10-CM

## 2021-05-20 DIAGNOSIS — Z6838 Body mass index (BMI) 38.0-38.9, adult: Secondary | ICD-10-CM | POA: Diagnosis not present

## 2021-05-20 MED ORDER — PHENTERMINE HCL 37.5 MG PO TABS: 37.5000 mg | ORAL_TABLET | Freq: Every day | ORAL | 0 refills | Status: DC

## 2021-05-20 MED ORDER — CYCLOBENZAPRINE HCL 10 MG PO TABS: 10.0000 mg | ORAL_TABLET | Freq: Three times a day (TID) | ORAL | 2 refills | Status: DC | PRN

## 2021-05-20 NOTE — Progress Notes (Signed)
I connected with [name] on 05/20/21 at  8:10 AM EDT by telephone and verified that I am speaking with the correct person using two identifiers.   I discussed the limitations, risks, security and privacy concerns of performing an evaluation and management service by telephone and the availability of in person appointments. I also discussed with the patient that there may be a patient responsible charge related to this service. The patient expressed understanding and agreed to proceed.  The patient was at home I spoke with the patient from my workstation phone The names of people involved in this encounter were: [name] , and [name]   Gynecology Office Visit  Chief Complaint:  Chief Complaint  Patient presents with   Follow-up    Medication Follow up. Doing well.    History of Present Illness: Patientis a 56 y.o. [place] female, who presents for the evaluation of weight gain. She has gained lost 10 pounds primarily over 2 months. The patient states the following issues have contributed to her weight problem: has done lifestyle changes, eating later in the evening.  Has had stressors with insurance on her orthopedic surgery.  The patient has no additional symptoms. The patient specifically denies memory loss, muscle weakness, excessive thirst, and polyuria. Weight related co-morbidities include joint problems. She has tried phentermine interventions in the past with good success.   Review of Systems: 10 point review of systems negative unless otherwise noted in HPI  Past Medical History:  Patient Active Problem List   Diagnosis Date Noted   Primary osteoarthritis of right knee 11/23/2019   Benign neoplasm of cecum    Special screening for malignant neoplasms, colon    Primary osteoarthritis of left knee 05/17/2016   Tendinitis of right rotator cuff 05/17/2016   Osteoarthritis of left shoulder 05/09/2015   TOBACCO ABUSE 06/20/2008    Qualifier: History of   By: Marinell Blight, Dawn       CONSTIPATION 06/18/2008    Qualifier: Diagnosis of  By: Ta MD, Cat       HEART MURMUR, BENIGN 06/18/2008    Qualifier: Diagnosis of  By: Ta MD, Cat       OTHER RESPIRATORY COMPLICATIONS 81/85/6314    Qualifier: Diagnosis of  By: Earley Favor MD, Cat        Past Surgical History:  Past Surgical History:  Procedure Laterality Date   APPLICATION OF WOUND VAC Right 05/20/2020   Procedure: APPLICATION OF WOUND VAC TO RIGHT FOOT;  Surgeon: Erle Crocker, MD;  Location: [facility];  Service: Orthopedics;  Laterality: Right;   COLONOSCOPY WITH PROPOFOL N/A 09/03/2016   Procedure: COLONOSCOPY WITH PROPOFOL;  Surgeon: [name], MD;  Location: [facility];  Service: Endoscopy;  Laterality: N/A;   FRACTURE SURGERY     left foot    INCISION AND DRAINAGE Right 05/20/2020   Procedure: RIGHT FOOT INCISION AND DRAINAGE;  Surgeon: Erle Crocker, MD;  Location: [place];  Service: Orthopedics;  Laterality: Right;  LENGTH OF SURGERY: 1 HOUR   KNEE CARTILAGE SURGERY  2015   left   LAPAROSCOPIC SALPINGOOPHERECTOMY     left hand     1 for ganglion cyst and next yr replace "joints" in left hand   PLANTAR FASCIA SURGERY     left foot   SHOULDER INJECTION Right 05/17/2016   Procedure: SHOULDER INJECTION;  Surgeon: Dorna Leitz, MD;  Location: [place];  Service: Orthopedics;  Laterality: Right;   SHOULDER SURGERY  x 2 on right shoulder   TONSILLECTOMY     TOTAL KNEE ARTHROPLASTY Left 05/17/2016   Procedure: TOTAL KNEE ARTHROPLASTY;  Surgeon: Dorna Leitz, MD;  Location: [place];  Service: Orthopedics;  Laterality: Left;   TOTAL KNEE ARTHROPLASTY Right 11/23/2019   Procedure: TOTAL KNEE ARTHROPLASTY;  Surgeon: Dorna Leitz, MD;  Location: [facility];  Service: Orthopedics;  Laterality: Right;   TOTAL SHOULDER ARTHROPLASTY Left 05/09/2015   Procedure: TOTAL SHOULDER ARTHROPLASTY;  Surgeon: Dorna Leitz, MD;  Location: [place];  Service: Orthopedics;  Laterality: Left;    TUBAL LIGATION     tubes removed stated patient    Gynecologic History: No LMP recorded. Patient is postmenopausal.  Obstetric History: G0P0000  Family History:  Family History  Problem Relation Age of Onset   Heart attack Father    Stroke Father     Social History:  Social History   Socioeconomic History   Marital status: Single    Spouse name: Not on file   Number of children: Not on file   Years of education: Not on file   Highest education level: Not on file  Occupational History   Not on file  Tobacco Use   Smoking status: Former    Packs/day: 1.00    Years: 30.00    Pack years: 30.00    Types: Cigarettes   Smokeless tobacco: Never  Vaping Use   Vaping Use: Never used  Substance and Sexual Activity   Alcohol use: Not Currently   Drug use: No   Sexual activity: Yes    Birth control/protection: None  Other Topics Concern   Not on file  Social History Narrative   Not on file   Social Determinants of Health   Financial Resource Strain: Not on file  Food Insecurity: Not on file  Transportation Needs: Not on file  Physical Activity: Not on file  Stress: Not on file  Social Connections: Not on file  Intimate Partner Violence: Not on file    Allergies:  Allergies  Allergen Reactions   Augmentin [Amoxicillin-Pot Clavulanate] Anaphylaxis    Did it involve swelling of the face/tongue/throat, SOB, or low BP? Yes Did it involve sudden or severe rash/hives, skin peeling, or any reaction on the inside of your mouth or nose? No Did you need to seek medical attention at a hospital or doctor's office? Yes When did it last happen? 15 years ago      If all above answers are "NO", may proceed with cephalosporin use.    Bee Venom Anaphylaxis, Hives and Shortness Of Breath    Treats with benadryl   Diclofenac Sodium Itching and Swelling    Can tolerate Voltaren  Cannot use PENNSAID- topical gel   Other Anaphylaxis, Hives and Shortness Of Breath    Reaction to  spider bites (treats with benadryl)    Medications: Prior to Admission medications   Medication Sig Start Date End Date Taking? Authorizing Provider  albuterol (PROVENTIL) (2.5 MG/3ML) 0.083% nebulizer solution Inhale 3 mLs into the lungs every 4 (four) hours as needed for shortness of breath. 11/01/20  Yes [provider]  b complex vitamins tablet Take 1 tablet by mouth daily.   Yes [provider]  chlorpheniramine-HYDROcodone (TUSSIONEX) 10-8 MG/5ML SUER Take 5 mLs by mouth every 12 (twelve) hours as needed for cough.   Yes [provider]  Cholecalciferol (DIALYVITE VITAMIN D 5000) 125 MCG (5000 UT) capsule Take 5,000 Units by mouth daily.   Yes [provider]  cloNIDine (CATAPRES) 0.1 MG tablet Take 1 tablet (0.1 mg total) by mouth 2 (two) times daily. 03/10/21  Yes April Mood, MD  diclofenac Sodium (VOLTAREN) 1 % GEL Apply 4 g topically 3 (three) times daily as needed (pain). 02/06/21  Yes [provider]  fluticasone (FLONASE) 50 MCG/ACT nasal spray Place 2 sprays into both nostrils daily as needed for allergies.   Yes [provider]  hydrochlorothiazide (HYDRODIURIL) 25 MG tablet Take 25 mg by mouth daily.   Yes [provider]  ibuprofen (ADVIL) 800 MG tablet Take 800 mg by mouth every 8 (eight) hours as needed for moderate pain.   Yes [provider]  losartan (COZAAR) 100 MG tablet Take 100 mg by mouth daily.   Yes [provider]  Multiple Vitamin (MULTIVITAMIN WITH MINERALS) TABS tablet Take 1 tablet by mouth daily.   Yes [provider]  NARCAN 4 MG/0.1ML LIQD nasal spray kit Place 1 spray into the nose as needed (opioid overdose). 05/21/20  Yes [provider]  nicotine (NICODERM CQ - DOSED IN MG/24 HR) 7 mg/24hr patch Place 7 mg onto the skin daily.   Yes [provider]  oxyCODONE-acetaminophen (PERCOCET) 10-325 MG tablet Take 1 tablet by mouth every 4 (four) hours.  08/10/20  Yes [provider]  phentermine (ADIPEX-P) 37.5 MG tablet Take 1 tablet (37.5 mg total) by mouth daily before breakfast. 03/10/21  Yes April Mood, MD  torsemide (DEMADEX) 10 MG tablet Take 1 tablet (10 mg total) by mouth daily. 09/01/20  Yes Kate Sable, MD  traZODone (DESYREL) 50 MG tablet Take 50 mg by mouth at bedtime as needed for sleep. Take with 100 mg to equal 150 mg at bedtime as needed for sleep   Yes [provider]  venlafaxine XR (EFFEXOR-XR) 150 MG 24 hr capsule Take 1 capsule (150 mg total) by mouth daily. 01/02/21  Yes April Mood, MD  VENTOLIN HFA 108 [phone] Base) MCG/ACT inhaler Inhale 2 puffs into the lungs every 4 (four) hours as needed for wheezing or shortness of breath.  03/19/20  Yes [provider]    Physical Exam Blood pressure (!) 128/107, weight 204 lb (92.5 kg). No LMP recorded. Patient is postmenopausal. Body mass index is 38.55 kg/m.  No physical exam as this was a remote telephone visit to promote social distancing during the current COVID-19 Pandemic   Assessment: 55 y.o. G0P0000 presenting for discussion of weight loss management options  Plan: Problem List Items Addressed This Visit   None Visit Diagnoses     Class 2 obesity without serious comorbidity with body mass index (BMI) of 38.0 to 38.9 in adult, unspecified obesity type    -  Primary   Relevant Medications   phentermine (ADIPEX-P) 37.5 MG tablet      Obesity  1) 1500 Calorie ADA Diet  2) Patient education given regarding appropriate lifestyle changes for weight loss including: regular physical activity, healthy coping strategies, caloric restriction and healthy eating patterns.  3) Encouraged weekly weight monitorig to track progress and sample 1 week food diary  4) Telephone: 13:46mn  5) Return in about 4 weeks (around 06/17/2021) for medication follow up.  Vasomotor Symptoms  1) Continue Effexor and continue clonidine.  Reports  improvement with addition clonidine.     AMalachy Mood MD, FJefferson HeightsOB/GYN, C[facility]hy[facility] 05/20/2021, 8:16 AM

## 2021-05-22 ENCOUNTER — Ambulatory Visit (HOSPITAL_COMMUNITY)
Admission: RE | Admit: 2021-05-22 | Payer: BC Managed Care – PPO | Source: Home / Self Care | Admitting: Orthopedic Surgery

## 2021-05-22 ENCOUNTER — Encounter (HOSPITAL_COMMUNITY): Admission: RE | Payer: Self-pay | Source: Home / Self Care

## 2021-05-22 SURGERY — ARTHROPLASTY, HIP, TOTAL, ANTERIOR APPROACH
Anesthesia: Spinal | Site: Hip | Laterality: Left

## 2021-05-25 ENCOUNTER — Telehealth: Payer: Self-pay

## 2021-05-25 MED ORDER — PHENTERMINE HCL 37.5 MG PO TABS: 37.5000 mg | ORAL_TABLET | Freq: Every day | ORAL | 0 refills | Status: DC

## 2021-05-25 NOTE — Telephone Encounter (Signed)
I was unable to get rx to go thru electronically; called verbal order in to [facility].

## 2021-05-25 NOTE — Telephone Encounter (Signed)
Pt calling; has a problem c medication that was rx'd on the 13th.  [phone]  Pt states she has received phentermine KVK from [facility]; they no longer get phentermine AUR which works very well for the pt whereas the [place] she can't tell any difference with it.  Can rx of the phentermine AUR be sent to [facility]?  She has called them and they have it in stock.  They will call her when it's ready to p/u.  Pt is facing hip surgery and needs to get some weight off.

## 2021-06-06 ENCOUNTER — Other Ambulatory Visit: Payer: Self-pay | Admitting: Obstetrics and Gynecology

## 2021-07-10 ENCOUNTER — Other Ambulatory Visit: Payer: Self-pay

## 2021-07-10 ENCOUNTER — Ambulatory Visit (INDEPENDENT_AMBULATORY_CARE_PROVIDER_SITE_OTHER): Payer: BC Managed Care – PPO | Admitting: Obstetrics and Gynecology

## 2021-07-10 ENCOUNTER — Encounter: Payer: Self-pay | Admitting: Obstetrics and Gynecology

## 2021-07-10 ENCOUNTER — Telehealth: Payer: Self-pay

## 2021-07-10 VITALS — Wt 202.0 lb

## 2021-07-10 DIAGNOSIS — E669 Obesity, unspecified: Secondary | ICD-10-CM | POA: Diagnosis not present

## 2021-07-10 DIAGNOSIS — Z6838 Body mass index (BMI) 38.0-38.9, adult: Secondary | ICD-10-CM | POA: Diagnosis not present

## 2021-07-10 MED ORDER — CYCLOBENZAPRINE HCL 10 MG PO TABS: 10.0000 mg | ORAL_TABLET | Freq: Three times a day (TID) | ORAL | 2 refills | Status: DC | PRN

## 2021-07-10 MED ORDER — TRAZODONE HCL 100 MG PO TABS: 200.0000 mg | ORAL_TABLET | Freq: Every evening | ORAL | 3 refills | Status: DC | PRN

## 2021-07-10 MED ORDER — PHENTERMINE HCL 37.5 MG PO TABS: 37.5000 mg | ORAL_TABLET | Freq: Every day | ORAL | 0 refills | Status: DC

## 2021-07-10 NOTE — Progress Notes (Signed)
I connected with [name] on 07/10/21 at 10:10 AM EDT by telephone and verified that I am speaking with the correct person using two identifiers.   I discussed the limitations, risks, security and privacy concerns of performing an evaluation and management service by telephone and the availability of in person appointments. I also discussed with the patient that there may be a patient responsible charge related to this service. The patient expressed understanding and agreed to proceed.  The patient was at home I spoke with the patient from my workstation phone The names of people involved in this encounter were: [name] , and [name]   Gynecology Office Visit  Chief Complaint:  Chief Complaint  Patient presents with   Follow-up    Phone visit - requesting phentermine to [facility] drug, flexeril refills.    History of Present Illness: Patientis a 55 y.o. [name], who presents for the evaluation of the desire to lose weight. She has lost 2 pounds 1 months. The patient states the following symptoms since starting her weight loss therapy: appetite suppression, energy, and weight loss.  The patient also reports no other ill effects. The patient specifically denies heart palpitations, anxiety, and insomnia.    Review of Systems: 10 point review of systems negative unless otherwise noted in HPI  Past Medical History:  Past Medical History:  Diagnosis Date   Arthritis    COPD (chronic obstructive pulmonary disease) ([place])    denies   COVID-19 10/23/2019   Asymptomatic   Environmental and seasonal allergies    Falls    Fibromyalgia    History of colon polyps 2017   Hypertension    Pneumonia    aspiration PNA   Wound infection    right foot    Past Surgical History:  Past Surgical History:  Procedure Laterality Date   APPLICATION OF WOUND VAC Right 05/20/2020   Procedure: APPLICATION OF WOUND VAC TO RIGHT FOOT;  Surgeon: Erle Crocker,  MD;  Location: [place];  Service: Orthopedics;  Laterality: Right;   COLONOSCOPY WITH PROPOFOL N/A 09/03/2016   Procedure: COLONOSCOPY WITH PROPOFOL;  Surgeon: [name], MD;  Location: [facility];  Service: Endoscopy;  Laterality: N/A;   FRACTURE SURGERY     left foot    INCISION AND DRAINAGE Right 05/20/2020   Procedure: RIGHT FOOT INCISION AND DRAINAGE;  Surgeon: Erle Crocker, MD;  Location: [place];  Service: Orthopedics;  Laterality: Right;  LENGTH OF SURGERY: 1 HOUR   KNEE CARTILAGE SURGERY  2015   left   LAPAROSCOPIC SALPINGOOPHERECTOMY     left hand     1 for ganglion cyst and next yr replace "joints" in left hand   PLANTAR FASCIA SURGERY     left foot   SHOULDER INJECTION Right 05/17/2016   Procedure: SHOULDER INJECTION;  Surgeon: Dorna Leitz, MD;  Location: [place];  Service: Orthopedics;  Laterality: Right;   SHOULDER SURGERY     x 2 on right shoulder   TONSILLECTOMY     TOTAL KNEE ARTHROPLASTY Left 05/17/2016   Procedure: TOTAL KNEE ARTHROPLASTY;  Surgeon: Dorna Leitz, MD;  Location: [place];  Service: Orthopedics;  Laterality: Left;   TOTAL KNEE ARTHROPLASTY Right 11/23/2019   Procedure: TOTAL KNEE ARTHROPLASTY;  Surgeon: Dorna Leitz, MD;  Location: [facility];  Service: Orthopedics;  Laterality: Right;   TOTAL SHOULDER ARTHROPLASTY Left 05/09/2015   Procedure: TOTAL SHOULDER ARTHROPLASTY;  Surgeon: Dorna Leitz, MD;  Location: [place];  Service: Orthopedics;  Laterality: Left;   TUBAL LIGATION     tubes removed stated patient    Gynecologic History: No LMP recorded. Patient is postmenopausal.  Obstetric History: G0P0000  Family History:  Family History  Problem Relation Age of Onset   Heart attack Father    Stroke Father     Social History:  Social History   Socioeconomic History   Marital status: Single    Spouse name: Not on file   Number of children: Not on file   Years of education: Not on file   Highest education level: Not on file  Occupational History    Not on file  Tobacco Use   Smoking status: Former    Packs/day: 1.00    Years: 30.00    Pack years: 30.00    Types: Cigarettes   Smokeless tobacco: Never  Vaping Use   Vaping Use: Never used  Substance and Sexual Activity   Alcohol use: Not Currently   Drug use: No   Sexual activity: Yes    Birth control/protection: None  Other Topics Concern   Not on file  Social History Narrative   Not on file   Social Determinants of Health   Financial Resource Strain: Not on file  Food Insecurity: Not on file  Transportation Needs: Not on file  Physical Activity: Not on file  Stress: Not on file  Social Connections: Not on file  Intimate Partner Violence: Not on file    Allergies:  Allergies  Allergen Reactions   Augmentin [Amoxicillin-Pot Clavulanate] Anaphylaxis    Did it involve swelling of the face/tongue/throat, SOB, or low BP? Yes Did it involve sudden or severe rash/hives, skin peeling, or any reaction on the inside of your mouth or nose? No Did you need to seek medical attention at a hospital or doctor's office? Yes When did it last happen? 15 years ago      If all above answers are "NO", may proceed with cephalosporin use.    Bee Venom Anaphylaxis, Hives and Shortness Of Breath    Treats with benadryl   Diclofenac Sodium Itching and Swelling    Can tolerate Voltaren  Cannot use PENNSAID- topical gel   Other Anaphylaxis, Hives and Shortness Of Breath    Reaction to spider bites (treats with benadryl)    Medications: Prior to Admission medications   Medication Sig Start Date End Date Taking? Authorizing Provider  albuterol (PROVENTIL) (2.5 MG/3ML) 0.083% nebulizer solution Inhale 3 mLs into the lungs every 4 (four) hours as needed for shortness of breath. 11/01/20   [provider]  b complex vitamins tablet Take 1 tablet by mouth daily.    [provider]  chlorpheniramine-HYDROcodone (TUSSIONEX) 10-8 MG/5ML SUER Take 5 mLs by mouth every 12  (twelve) hours as needed for cough.    [provider]  Cholecalciferol (DIALYVITE VITAMIN D 5000) 125 MCG (5000 UT) capsule Take 5,000 Units by mouth daily.    [provider]  cloNIDine (CATAPRES) 0.1 MG tablet Take 1 tablet (0.1 mg total) by mouth 2 (two) times daily. 03/10/21   [name], MD  cyclobenzaprine (FLEXERIL) 10 MG tablet Take 1 tablet (10 mg total) by mouth 3 (three) times daily as needed for muscle spasms. 05/20/21   [name], MD  diclofenac Sodium (VOLTAREN) 1 % GEL Apply 4 g topically 3 (three) times daily as needed (pain). 02/06/21   [provider]  fluticasone (FLONASE) 50 MCG/ACT nasal spray Place 2 sprays into both nostrils  daily as needed for allergies.    [provider]  hydrochlorothiazide (HYDRODIURIL) 25 MG tablet Take 25 mg by mouth daily.    [provider]  ibuprofen (ADVIL) 800 MG tablet Take 800 mg by mouth every 8 (eight) hours as needed for moderate pain.    [provider]  losartan (COZAAR) 100 MG tablet Take 100 mg by mouth daily.    [provider]  Multiple Vitamin (MULTIVITAMIN WITH MINERALS) TABS tablet Take 1 tablet by mouth daily.    [provider]  NARCAN 4 MG/0.1ML LIQD nasal spray kit Place 1 spray into the nose as needed (opioid overdose). 05/21/20   [provider]  nicotine (NICODERM CQ - DOSED IN MG/24 HR) 7 mg/24hr patch Place 7 mg onto the skin daily.    [provider]  oxyCODONE-acetaminophen (PERCOCET) 10-325 MG tablet Take 1 tablet by mouth every 4 (four) hours. 08/10/20   [provider]  phentermine (ADIPEX-P) 37.5 MG tablet Take 1 tablet (37.5 mg total) by mouth daily before breakfast. 05/25/21   Gae Dry, MD  torsemide (DEMADEX) 10 MG tablet Take 1 tablet (10 mg total) by mouth daily. 09/01/20   Kate Sable, MD  traZODone (DESYREL) 100 MG tablet TAKE 1 TABLET BY MOUTH AT BEDTIME AS NEEDED FOR SLEEP 06/07/21   [name], MD  venlafaxine XR (EFFEXOR-XR) 150 MG 24 hr capsule Take 1 capsule (150 mg total) by mouth daily. 01/02/21   [name], MD  VENTOLIN HFA 108 [phone] Base) MCG/ACT inhaler Inhale 2 puffs into the lungs every 4 (four) hours as needed for wheezing or shortness of breath.  03/19/20   [provider]    Physical Exam Weight 202 lb (91.6 kg). Wt Readings from Last 3 Encounters:  07/10/21 202 lb (91.6 kg)  05/20/21 204 lb (92.5 kg)  04/29/21 213 lb 6.4 oz (96.8 kg)  Body mass index is 38.17 kg/m.  No physical exam as this was a remote telephone visit to promote social distancing during the current COVID-19 Pandemic   Assessment: 55 y.o. G0P0000 medication follow up    Plan: Problem List Items Addressed This Visit   None Visit Diagnoses     Class 2 obesity without serious comorbidity with body mass index (BMI) of 38.0 to 38.9 in adult, unspecified obesity type    -  Primary   Relevant Medications   phentermine (ADIPEX-P) 37.5 MG tablet       1) 1500 Calorie ADA Diet  2) Patient education given regarding appropriate lifestyle changes for weight loss including: regular physical activity, healthy coping strategies, caloric restriction and healthy eating patterns.  3) Patient to take medication, with the benefits of appetite suppression and metabolism boost d/w pt, along with the side effects and risk factors of long term use that will be avoided with our use of short bursts of therapy. Rx provided.    4) Telephone 11:1mn  5) Return in about 4 weeks (around 08/07/2021) for follow up weight loss.    AMalachy Mood MD, [facility], [facility] 07/10/2021, 10:47 AM

## 2021-08-10 ENCOUNTER — Telehealth: Payer: Self-pay

## 2021-08-10 ENCOUNTER — Telehealth: Payer: Self-pay | Admitting: Cardiology

## 2021-08-10 NOTE — Telephone Encounter (Signed)
Pt calling; has appt on the 11th that was moved from the 4th d/t provider being in surgery.  Needs refill of phentermine 37.5mg ; will run out Sunday.  [phone]

## 2021-08-10 NOTE — Telephone Encounter (Signed)
We can not refill phentermine without a visit it is a scheduled drug

## 2021-08-10 NOTE — Telephone Encounter (Signed)
Pt needs ortho FMLA for knee and foot surgery, as well as upcoming hip surgery.   Advised that we would need her to sign a release form for disability paperwork; however, if the issue she is seeking coverage for is not cardiac in nature, she should reach out to the specialty that will be able to provide documentation that she needs disability.  She stated that she does not have any cardiac issues and needs ortho disability coverage.  She will call our office if anything changes but does not feel she needs disability from our office.  She expressed her appreciation for the call.

## 2021-08-10 NOTE — Telephone Encounter (Signed)
  Pt would like to ask if she needs to sign a release form for her disability paper work

## 2021-08-10 NOTE — Telephone Encounter (Signed)
Pt aware.

## 2021-08-11 ENCOUNTER — Ambulatory Visit: Payer: BC Managed Care – PPO | Admitting: Obstetrics and Gynecology

## 2021-08-20 ENCOUNTER — Other Ambulatory Visit: Payer: Self-pay

## 2021-08-20 ENCOUNTER — Encounter: Payer: Self-pay | Admitting: Obstetrics and Gynecology

## 2021-08-20 ENCOUNTER — Ambulatory Visit (INDEPENDENT_AMBULATORY_CARE_PROVIDER_SITE_OTHER): Payer: BC Managed Care – PPO | Admitting: Obstetrics and Gynecology

## 2021-08-20 VITALS — Ht 61.0 in | Wt 205.0 lb

## 2021-08-20 DIAGNOSIS — E669 Obesity, unspecified: Secondary | ICD-10-CM | POA: Diagnosis not present

## 2021-08-20 DIAGNOSIS — L680 Hirsutism: Secondary | ICD-10-CM | POA: Diagnosis not present

## 2021-08-20 DIAGNOSIS — Z6838 Body mass index (BMI) 38.0-38.9, adult: Secondary | ICD-10-CM | POA: Diagnosis not present

## 2021-08-20 MED ORDER — PHENTERMINE HCL 37.5 MG PO TABS: 37.5000 mg | ORAL_TABLET | Freq: Every day | ORAL | 0 refills | Status: DC

## 2021-08-20 MED ORDER — VANIQA 13.9 % EX CREA: TOPICAL_CREAM | CUTANEOUS | 3 refills | Status: DC

## 2021-08-20 NOTE — Progress Notes (Signed)
I connected with [name] on 08/20/21 at  3:10 PM EDT by telephone and verified that I am speaking with the correct person using two identifiers.   I discussed the limitations, risks, security and privacy concerns of performing an evaluation and management service by telephone and the availability of in person appointments. I also discussed with the patient that there may be a patient responsible charge related to this service. The patient expressed understanding and agreed to proceed.  The patient was at home I spoke with the patient from my workstation phone The names of people involved in this encounter were: [name] , and [name]   Gynecology Office Visit  Chief Complaint:  Chief Complaint  Patient presents with   Follow-up    Phone visit - weight loss, no concerns.    History of Present Illness: Patientis a 55 y.o. [name] female, who presents for the evaluation of the desire to lose weight. She has gained 3lbs pounds 1 months. The patient states the following symptoms since starting her weight loss therapy: appetite suppression, energy, and weight loss.  The patient also reports no other ill effects. The patient specifically denies heart palpitations, anxiety, and insomnia. Has been off phentermine for past 9 days.   Review of Systems: 10 point review of systems negative unless otherwise noted in HPI  Past Medical History:  Past Medical History:  Diagnosis Date   Arthritis    COPD (chronic obstructive pulmonary disease) ([place])    denies   COVID-19 10/23/2019   Asymptomatic   Environmental and seasonal allergies    Falls    Fibromyalgia    History of colon polyps 2017   Hypertension    Pneumonia    aspiration PNA   Wound infection    right foot    Past Surgical History:  Past Surgical History:  Procedure Laterality Date   APPLICATION OF WOUND VAC Right 05/20/2020   Procedure: APPLICATION OF WOUND VAC TO RIGHT FOOT;  Surgeon: Erle Crocker, MD;  Location: [place];  Service: Orthopedics;  Laterality: Right;   COLONOSCOPY WITH PROPOFOL N/A 09/03/2016   Procedure: COLONOSCOPY WITH PROPOFOL;  Surgeon: [name], MD;  Location: [facility];  Service: Endoscopy;  Laterality: N/A;   FRACTURE SURGERY     left foot    INCISION AND DRAINAGE Right 05/20/2020   Procedure: RIGHT FOOT INCISION AND DRAINAGE;  Surgeon: Erle Crocker, MD;  Location: [place];  Service: Orthopedics;  Laterality: Right;  LENGTH OF SURGERY: 1 HOUR   KNEE CARTILAGE SURGERY  2015   left   LAPAROSCOPIC SALPINGOOPHERECTOMY     left hand     1 for ganglion cyst and next yr replace "joints" in left hand   PLANTAR FASCIA SURGERY     left foot   SHOULDER INJECTION Right 05/17/2016   Procedure: SHOULDER INJECTION;  Surgeon: Dorna Leitz, MD;  Location: [place];  Service: Orthopedics;  Laterality: Right;   SHOULDER SURGERY     x 2 on right shoulder   TONSILLECTOMY     TOTAL KNEE ARTHROPLASTY Left 05/17/2016   Procedure: TOTAL KNEE ARTHROPLASTY;  Surgeon: Dorna Leitz, MD;  Location: [place];  Service: Orthopedics;  Laterality: Left;   TOTAL KNEE ARTHROPLASTY Right 11/23/2019   Procedure: TOTAL KNEE ARTHROPLASTY;  Surgeon: Dorna Leitz, MD;  Location: [facility];  Service: Orthopedics;  Laterality: Right;   TOTAL SHOULDER ARTHROPLASTY Left 05/09/2015   Procedure: TOTAL SHOULDER ARTHROPLASTY;  Surgeon: Dorna Leitz, MD;  Location: [place];  Service: Orthopedics;  Laterality: Left;   TUBAL LIGATION     tubes removed stated patient    Gynecologic History: No LMP recorded. Patient is postmenopausal.  Obstetric History: G0P0000  Family History:  Family History  Problem Relation Age of Onset   Heart attack Father    Stroke Father     Social History:  Social History   Socioeconomic History   Marital status: Single    Spouse name: Not on file   Number of children: Not on file   Years of education: Not on file   Highest education level: Not on file   Occupational History   Not on file  Tobacco Use   Smoking status: Former    Packs/day: 1.00    Years: 30.00    Pack years: 30.00    Types: Cigarettes   Smokeless tobacco: Never  Vaping Use   Vaping Use: Never used  Substance and Sexual Activity   Alcohol use: Not Currently   Drug use: No   Sexual activity: Yes    Birth control/protection: None  Other Topics Concern   Not on file  Social History Narrative   Not on file   Social Determinants of Health   Financial Resource Strain: Not on file  Food Insecurity: Not on file  Transportation Needs: Not on file  Physical Activity: Not on file  Stress: Not on file  Social Connections: Not on file  Intimate Partner Violence: Not on file    Allergies:  Allergies  Allergen Reactions   Augmentin [Amoxicillin-Pot Clavulanate] Anaphylaxis    Did it involve swelling of the face/tongue/throat, SOB, or low BP? Yes Did it involve sudden or severe rash/hives, skin peeling, or any reaction on the inside of your mouth or nose? No Did you need to seek medical attention at a hospital or doctor's office? Yes When did it last happen? 15 years ago      If all above answers are "NO", may proceed with cephalosporin use.    Bee Venom Anaphylaxis, Hives and Shortness Of Breath    Treats with benadryl   Diclofenac Sodium Itching and Swelling    Can tolerate Voltaren  Cannot use PENNSAID- topical gel   Other Anaphylaxis, Hives and Shortness Of Breath    Reaction to spider bites (treats with benadryl)    Medications: Prior to Admission medications   Medication Sig Start Date End Date Taking? Authorizing Provider  albuterol (PROVENTIL) (2.5 MG/3ML) 0.083% nebulizer solution Inhale 3 mLs into the lungs every 4 (four) hours as needed for shortness of breath. 11/01/20  Yes [provider]  b complex vitamins tablet Take 1 tablet by mouth daily.   Yes [provider]  chlorpheniramine-HYDROcodone (TUSSIONEX) 10-8 MG/5ML SUER Take  5 mLs by mouth every 12 (twelve) hours as needed for cough.   Yes [provider]  Cholecalciferol (DIALYVITE VITAMIN D 5000) 125 MCG (5000 UT) capsule Take 5,000 Units by mouth daily.   Yes [provider]  cyclobenzaprine (FLEXERIL) 10 MG tablet Take 1 tablet (10 mg total) by mouth 3 (three) times daily as needed for muscle spasms. 07/10/21  Yes [name], MD  diclofenac Sodium (VOLTAREN) 1 % GEL Apply 4 g topically 3 (three) times daily as needed (pain). 02/06/21  Yes [provider]  fluticasone (FLONASE) 50 MCG/ACT nasal spray Place 2 sprays into both nostrils daily as needed for allergies.   Yes [provider]  hydrochlorothiazide (HYDRODIURIL) 25 MG tablet Take 25 mg  by mouth daily.   Yes [provider]  ibuprofen (ADVIL) 800 MG tablet Take 800 mg by mouth every 8 (eight) hours as needed for moderate pain.   Yes [provider]  losartan (COZAAR) 100 MG tablet Take 100 mg by mouth daily.   Yes [provider]  Multiple Vitamin (MULTIVITAMIN WITH MINERALS) TABS tablet Take 1 tablet by mouth daily.   Yes [provider]  oxyCODONE-acetaminophen (PERCOCET) 10-325 MG tablet Take 1 tablet by mouth every 4 (four) hours. 08/10/20  Yes [provider]  phentermine (ADIPEX-P) 37.5 MG tablet Take 1 tablet (37.5 mg total) by mouth daily before breakfast. 07/10/21  Yes [name], MD  traZODone (DESYREL) 100 MG tablet Take 2 tablets (200 mg total) by mouth at bedtime as needed. for sleep 07/10/21  Yes [name], MD  venlafaxine XR (EFFEXOR-XR) 150 MG 24 hr capsule Take 1 capsule (150 mg total) by mouth daily. 01/02/21  Yes [name], MD  cloNIDine (CATAPRES) 0.1 MG tablet Take 1 tablet (0.1 mg total) by mouth 2 (two) times daily. Patient not taking: Reported on 08/20/2021 03/10/21   [name], MD  [facility] 4 MG/0.1ML LIQD nasal spray kit Place 1 spray into the nose as needed (opioid overdose).  05/21/20   [provider]  nicotine (NICODERM CQ - DOSED IN MG/24 HR) 7 mg/24hr patch Place 7 mg onto the skin daily.    [provider]  torsemide (DEMADEX) 10 MG tablet Take 1 tablet (10 mg total) by mouth daily. 09/01/20   Kate Sable, MD  VENTOLIN HFA 108 (90 Base) MCG/ACT inhaler Inhale 2 puffs into the lungs every 4 (four) hours as needed for wheezing or shortness of breath.  03/19/20   [provider]    Physical Exam Height 5' 1"  (1.549 m), weight 205 lb (93 kg). Wt Readings from Last 3 Encounters:  08/20/21 205 lb (93 kg)  07/10/21 202 lb (91.6 kg)  05/20/21 204 lb (92.5 kg)  Body mass index is 38.73 kg/m.  No physical exam as this was a remote telephone visit to promote social distancing during the current COVID-19 Pandemic  Assessment: 55 y.o. G0P0000 weight loss follow up  Plan: Problem List Items Addressed This Visit   None Visit Diagnoses     Class 2 obesity without serious comorbidity with body mass index (BMI) of 38.0 to 38.9 in adult, unspecified obesity type    -  Primary   Relevant Medications   phentermine (ADIPEX-P) 37.5 MG tablet   Hirsutism           1) 1500 Calorie ADA Diet  2) Patient education given regarding appropriate lifestyle changes for weight loss including: regular physical activity, healthy coping strategies, caloric restriction and healthy eating patterns.  3) Patient will be started on weight loss medication. The risks and benefits and side effects of medication, such as Adipex (Phenteramine) ,  Tenuate (Diethylproprion), Contrave (buproprion/naltrexone), Qsymia (phentermine/topiramate), and Saxenda (liraglutide) is discussed. The pros and cons of suppressing appetite and boosting metabolism is discussed. Risks of tolerence and addiction is discussed for selected agents discussed. Use of medicine will ne short term, such as 3-4 months at a time followed by a period of time off of the medicine to avoid these  risks and side effects for Adipex, Qsymia, and Tenuate discussed. Pt to call with any negative side effects and agrees to keep follow up appts.  4) Patient to take medication, with the benefits of appetite suppression and metabolism boost  d/w pt, along with the side effects and risk factors of long term use that will be avoided with our use of short bursts of therapy. Rx provided.    5) Telephone Time: 14:20mn  6) Hirsutism trial of vaniqa  7)  Return in about 4 weeks (around 09/17/2021) for medication follow up.    AMalachy Mood MD, [facility], [facility] 08/20/2021, 3:25 PM

## 2021-09-21 ENCOUNTER — Ambulatory Visit (INDEPENDENT_AMBULATORY_CARE_PROVIDER_SITE_OTHER): Payer: BC Managed Care – PPO | Admitting: Obstetrics and Gynecology

## 2021-09-21 ENCOUNTER — Other Ambulatory Visit: Payer: Self-pay

## 2021-09-21 DIAGNOSIS — J0110 Acute frontal sinusitis, unspecified: Secondary | ICD-10-CM | POA: Diagnosis not present

## 2021-09-21 DIAGNOSIS — Z6841 Body Mass Index (BMI) 40.0 and over, adult: Secondary | ICD-10-CM | POA: Diagnosis not present

## 2021-09-21 MED ORDER — CYCLOBENZAPRINE HCL 10 MG PO TABS: 10.0000 mg | ORAL_TABLET | Freq: Three times a day (TID) | ORAL | 2 refills | Status: DC | PRN

## 2021-09-21 MED ORDER — PHENTERMINE HCL 37.5 MG PO TABS: 37.5000 mg | ORAL_TABLET | Freq: Every day | ORAL | 0 refills | Status: DC

## 2021-09-21 MED ORDER — AZITHROMYCIN 250 MG PO TABS: ORAL_TABLET | ORAL | 0 refills | Status: AC

## 2021-09-21 NOTE — Progress Notes (Signed)
I connected with [name] on 09/21/21 at 10:10 AM EST by telephone and verified that I am speaking with the correct person using two identifiers.   I discussed the limitations, risks, security and privacy concerns of performing an evaluation and management service by telephone and the availability of in person appointments. I also discussed with the patient that there may be a patient responsible charge related to this service. The patient expressed understanding and agreed to proceed.  The patient was at home I spoke with the patient from my workstation phone The names of people involved in this encounter were: [name] , and [name]   Gynecology Office Visit  Chief Complaint:  Chief Complaint  Patient presents with   Follow-up    Phone visit - weight loss, no concerns.    History of Present Illness: Patientis a 55 y.o. [name], who presents for the evaluation of the desire to lose weight. She has gained 7 pounds 1 month.  The patient states the following symptoms since starting her weight loss therapy: appetite suppression, energy, and weight loss.  The patient also reports no other ill effects. The patient specifically denies heart palpitations, anxiety, and insomnia.   Also reports sinusitis symptoms.  Has been present for 3-4 months.   Not able to be in to see ENT until December.  Has a deviated septum issues in the past.  Previously had to use levaquin to clear   Review of Systems: 10 point review of systems negative unless otherwise noted in HPI  Past Medical History:  Past Medical History:  Diagnosis Date   Arthritis    COPD (chronic obstructive pulmonary disease) ([place])    denies   COVID-19 10/23/2019   Asymptomatic   Environmental and seasonal allergies    Falls    Fibromyalgia    History of colon polyps 2017   Hypertension    Pneumonia    aspiration PNA   Wound infection    right foot    Past Surgical History:  Past Surgical  History:  Procedure Laterality Date   APPLICATION OF WOUND VAC Right 05/20/2020   Procedure: APPLICATION OF WOUND VAC TO RIGHT FOOT;  Surgeon: Erle Crocker, MD;  Location: [place];  Service: Orthopedics;  Laterality: Right;   COLONOSCOPY WITH PROPOFOL N/A 09/03/2016   Procedure: COLONOSCOPY WITH PROPOFOL;  Surgeon: [name], MD;  Location: [facility];  Service: Endoscopy;  Laterality: N/A;   FRACTURE SURGERY     left foot    INCISION AND DRAINAGE Right 05/20/2020   Procedure: RIGHT FOOT INCISION AND DRAINAGE;  Surgeon: Erle Crocker, MD;  Location: [place];  Service: Orthopedics;  Laterality: Right;  LENGTH OF SURGERY: 1 HOUR   KNEE CARTILAGE SURGERY  2015   left   LAPAROSCOPIC SALPINGOOPHERECTOMY     left hand     1 for ganglion cyst and next yr replace "joints" in left hand   PLANTAR FASCIA SURGERY     left foot   SHOULDER INJECTION Right 05/17/2016   Procedure: SHOULDER INJECTION;  Surgeon: Dorna Leitz, MD;  Location: [place];  Service: Orthopedics;  Laterality: Right;   SHOULDER SURGERY     x 2 on right shoulder   TONSILLECTOMY     TOTAL KNEE ARTHROPLASTY Left 05/17/2016   Procedure: TOTAL KNEE ARTHROPLASTY;  Surgeon: Dorna Leitz, MD;  Location: [place];  Service: Orthopedics;  Laterality: Left;   TOTAL KNEE ARTHROPLASTY Right 11/23/2019   Procedure: TOTAL KNEE  ARTHROPLASTY;  Surgeon: Dorna Leitz, MD;  Location[facility];  Service: Orthopedics;  Laterality: Right;   TOTAL SHOULDER ARTHROPLASTY Left 05/09/2015   Procedure: TOTAL SHOULDER ARTHROPLASTY;  Surgeon: Dorna Leitz, MD;  Location: [place];  Service: Orthopedics;  Laterality: Left;   TUBAL LIGATION     tubes removed stated patient    Gynecologic History: No LMP recorded. Patient is postmenopausal.  Obstetric History: G0P0000  Family History:  Family History  Problem Relation Age of Onset   Heart attack Father    Stroke Father     Social History:  Social History   Socioeconomic History   Marital status:  Single    Spouse name: Not on file   Number of children: Not on file   Years of education: Not on file   Highest education level: Not on file  Occupational History   Not on file  Tobacco Use   Smoking status: Former    Packs/day: 1.00    Years: 30.00    Pack years: 30.00    Types: Cigarettes   Smokeless tobacco: Never  Vaping Use   Vaping Use: Never used  Substance and Sexual Activity   Alcohol use: Not Currently   Drug use: No   Sexual activity: Yes    Birth control/protection: None  Other Topics Concern   Not on file  Social History Narrative   Not on file   Social Determinants of Health   Financial Resource Strain: Not on file  Food Insecurity: Not on file  Transportation Needs: Not on file  Physical Activity: Not on file  Stress: Not on file  Social Connections: Not on file  Intimate Partner Violence: Not on file    Allergies:  Allergies  Allergen Reactions   Augmentin [Amoxicillin-Pot Clavulanate] Anaphylaxis    Did it involve swelling of the face/tongue/throat, SOB, or low BP? Yes Did it involve sudden or severe rash/hives, skin peeling, or any reaction on the inside of your mouth or nose? No Did you need to seek medical attention at a hospital or doctor's office? Yes When did it last happen? 15 years ago      If all above answers are "NO", may proceed with cephalosporin use.    Bee Venom Anaphylaxis, Hives and Shortness Of Breath    Treats with benadryl   Diclofenac Sodium Itching and Swelling    Can tolerate Voltaren  Cannot use PENNSAID- topical gel   Other Anaphylaxis, Hives and Shortness Of Breath    Reaction to spider bites (treats with benadryl)    Medications: Prior to Admission medications   Medication Sig Start Date End Date Taking? Authorizing Provider  albuterol (PROVENTIL) (2.5 MG/3ML) 0.083% nebulizer solution Inhale 3 mLs into the lungs every 4 (four) hours as needed for shortness of breath. 11/01/20   [provider]  b  complex vitamins tablet Take 1 tablet by mouth daily.    [provider]  chlorpheniramine-HYDROcodone (TUSSIONEX) 10-8 MG/5ML SUER Take 5 mLs by mouth every 12 (twelve) hours as needed for cough.    [provider]  Cholecalciferol (DIALYVITE VITAMIN D 5000) 125 MCG (5000 UT) capsule Take 5,000 Units by mouth daily.    [provider]  cyclobenzaprine (FLEXERIL) 10 MG tablet Take 1 tablet (10 mg total) by mouth 3 (three) times daily as needed for muscle spasms. 07/10/21   [name], MD  diclofenac Sodium (VOLTAREN) 1 % GEL Apply 4 g topically 3 (three) times daily as needed (pain). 02/06/21   [provider]  fluticasone (FLONASE) 50 MCG/ACT nasal spray Place 2 sprays into both nostrils daily as needed for allergies.    [provider]  hydrochlorothiazide (HYDRODIURIL) 25 MG tablet Take 25 mg by mouth daily.    [provider]  ibuprofen (ADVIL) 800 MG tablet Take 800 mg by mouth every 8 (eight) hours as needed for moderate pain.    [provider]  losartan (COZAAR) 100 MG tablet Take 100 mg by mouth daily.    [provider]  Multiple Vitamin (MULTIVITAMIN WITH MINERALS) TABS tablet Take 1 tablet by mouth daily.    [provider]  NARCAN 4 MG/0.1ML LIQD nasal spray kit Place 1 spray into the nose as needed (opioid overdose). 05/21/20   [provider]  nicotine (NICODERM CQ - DOSED IN MG/24 HR) 7 mg/24hr patch Place 7 mg onto the skin daily.    [provider]  oxyCODONE-acetaminophen (PERCOCET) 10-325 MG tablet Take 1 tablet by mouth every 4 (four) hours. 08/10/20   [provider]  phentermine (ADIPEX-P) 37.5 MG tablet Take 1 tablet (37.5 mg total) by mouth daily before breakfast. 08/20/21   [name], MD  torsemide (DEMADEX) 10 MG tablet Take 1 tablet (10 mg total) by mouth daily. 09/01/20   Kate Sable, MD  traZODone (DESYREL) 100 MG tablet Take 2 tablets (200 mg total)  by mouth at bedtime as needed. for sleep 07/10/21   [name], MD  VENTOLIN HFA 108 [phone] Base) MCG/ACT inhaler Inhale 2 puffs into the lungs every 4 (four) hours as needed for wheezing or shortness of breath.  03/19/20   [provider]    Physical Exam Height 5' 1"  (1.549 m), weight 212 lb (96.2 kg). Wt Readings from Last 3 Encounters:  09/21/21 212 lb (96.2 kg)  08/20/21 205 lb (93 kg)  07/10/21 202 lb (91.6 kg)  Body mass index is 40.06 kg/m.  No physical exam as this was a remote telephone visit to promote social distancing during the current COVID-19 Pandemic  Assessment: 55 y.o. G0P0000 medication follow up medical weight loss and sinusitis   Plan: Problem List Items Addressed This Visit   None Visit Diagnoses     Class 3 severe obesity without serious comorbidity with body mass index (BMI) of 40.0 to 44.9 in adult, unspecified obesity type ([place])    -  Primary   Relevant Medications   phentermine (ADIPEX-P) 37.5 MG tablet   Acute non-recurrent frontal sinusitis       Relevant Medications   azithromycin (ZITHROMAX) 250 MG tablet       1) 1500 Calorie ADA Diet  2) Patient education given regarding appropriate lifestyle changes for weight loss including: regular physical activity, healthy coping strategies, caloric restriction and healthy eating patterns.  3) Patient will be started on weight loss medication. The risks and benefits and side effects of medication, such as Adipex (Phenteramine) ,  Tenuate (Diethylproprion), Belviq (lorcarsin), Contrave (buproprion/naltrexone), Qsymia (phentermine/topiramate), and Saxenda (liraglutide) is discussed. The pros and cons of suppressing appetite and boosting metabolism is discussed. Risks of tolerence and addiction is discussed for selected agents discussed. Use of medicine will ne short term, such as 3-4 months at a time followed by a period of time off of the medicine to avoid these risks and side effects for Adipex,  Qsymia, and Tenuate discussed. Pt to call with any negative side effects and agrees to keep follow up appts.  4) Patient to take medication, with the benefits of appetite  suppression and metabolism boost d/w pt, along with the side effects and risk factors of long term use that will be avoided with our use of short bursts of therapy. Rx provided.    5) Sinusitis Rx for azithromycin.  Given upcoming orthopedic surgery discussed issues with Levaquin and tendon rupture.    6) Telephone Time: 15:42mn  7) Return in about 4 weeks (around 10/19/2021) for medication follow up 4 week phone.    AMalachy Mood MD, [facility], [facility] 09/21/2021, 10:14 AM

## 2021-09-30 ENCOUNTER — Telehealth: Payer: Self-pay

## 2021-09-30 NOTE — Telephone Encounter (Signed)
Pt calling; z-pk ended a few days ago; it was starting to clear stuff up; do you think she needs another round of the same thing?  [phone] pt states she is better but not completely.

## 2021-10-06 NOTE — Telephone Encounter (Signed)
Patient following up on previous message. [phone]

## 2021-10-09 NOTE — Telephone Encounter (Signed)
So if she has not improved after the Z-pack which I sent as a favor at the time of our last phone visit then she probably needs to see her primary care or urgent care

## 2021-10-09 NOTE — Telephone Encounter (Signed)
Patient aware. She states that all her PCP's she's had the past few years have left. One retired and the other two quit. States she has an appointment next week. Advised can go to Urgent Care if needed.

## 2021-10-21 ENCOUNTER — Ambulatory Visit (INDEPENDENT_AMBULATORY_CARE_PROVIDER_SITE_OTHER): Payer: BC Managed Care – PPO | Admitting: Obstetrics and Gynecology

## 2021-10-21 ENCOUNTER — Other Ambulatory Visit: Payer: Self-pay

## 2021-10-21 ENCOUNTER — Encounter: Payer: Self-pay | Admitting: Obstetrics and Gynecology

## 2021-10-21 VITALS — Ht 61.0 in | Wt 210.0 lb

## 2021-10-21 DIAGNOSIS — Z6839 Body mass index (BMI) 39.0-39.9, adult: Secondary | ICD-10-CM | POA: Diagnosis not present

## 2021-10-21 MED ORDER — PHENTERMINE HCL 37.5 MG PO TABS: 37.5000 mg | ORAL_TABLET | Freq: Every day | ORAL | 0 refills | Status: DC

## 2021-10-21 NOTE — Progress Notes (Signed)
I connected with [name] on 10/21/21 at 10:10 AM EST by telephone and verified that I am speaking with the correct person using two identifiers.   I discussed the limitations, risks, security and privacy concerns of performing an evaluation and management service by telephone and the availability of in person appointments. I also discussed with the patient that there may be a patient responsible charge related to this service. The patient expressed understanding and agreed to proceed.  The patient was at home I spoke with the patient from my workstation phone The names of people involved in this encounter were: [name] , and [name]   Gynecology Office Visit  Chief Complaint:  Chief Complaint  Patient presents with   Follow-up    Phone visit - weight loss, no concerns.    History of Present Illness: Patientis a 55 y.o. [place] female, who presents for the evaluation of the desire to lose weight. She has lost 2 pounds 1 months. The patient states the following symptoms since starting her weight loss therapy: appetite suppression, energy, and weight loss.  The patient also reports no other ill effects. The patient specifically denies heart palpitations, anxiety, and insomnia.    Review of Systems: 10 point review of systems negative unless otherwise noted in HPI  Past Medical History:  Past Medical History:  Diagnosis Date   Arthritis    COPD (chronic obstructive pulmonary disease) ([place])    denies   COVID-19 10/23/2019   Asymptomatic   Environmental and seasonal allergies    Falls    Fibromyalgia    History of colon polyps 2017   Hypertension    Pneumonia    aspiration PNA   Wound infection    right foot    Past Surgical History:  Past Surgical History:  Procedure Laterality Date   APPLICATION OF WOUND VAC Right 05/20/2020   Procedure: APPLICATION OF WOUND VAC TO RIGHT FOOT;  Surgeon: Erle Crocker, MD;  Location: [place];  Service:  Orthopedics;  Laterality: Right;   COLONOSCOPY WITH PROPOFOL N/A 09/03/2016   Procedure: COLONOSCOPY WITH PROPOFOL;  Surgeon: [name], MD;  Location: [facility];  Service: Endoscopy;  Laterality: N/A;   FRACTURE SURGERY     left foot    INCISION AND DRAINAGE Right 05/20/2020   Procedure: RIGHT FOOT INCISION AND DRAINAGE;  Surgeon: Erle Crocker, MD;  Location: [place];  Service: Orthopedics;  Laterality: Right;  LENGTH OF SURGERY: 1 HOUR   KNEE CARTILAGE SURGERY  2015   left   LAPAROSCOPIC SALPINGOOPHERECTOMY     left hand     1 for ganglion cyst and next yr replace "joints" in left hand   PLANTAR FASCIA SURGERY     left foot   SHOULDER INJECTION Right 05/17/2016   Procedure: SHOULDER INJECTION;  Surgeon: Dorna Leitz, MD;  Location: [place];  Service: Orthopedics;  Laterality: Right;   SHOULDER SURGERY     x 2 on right shoulder   TONSILLECTOMY     TOTAL KNEE ARTHROPLASTY Left 05/17/2016   Procedure: TOTAL KNEE ARTHROPLASTY;  Surgeon: Dorna Leitz, MD;  Location: [place];  Service: Orthopedics;  Laterality: Left;   TOTAL KNEE ARTHROPLASTY Right 11/23/2019   Procedure: TOTAL KNEE ARTHROPLASTY;  Surgeon: Dorna Leitz, MD;  [facility];  Service: Orthopedics;  Laterality: Right;   TOTAL SHOULDER ARTHROPLASTY Left 05/09/2015   Procedure: TOTAL SHOULDER ARTHROPLASTY;  Surgeon: Dorna Leitz, MD;  Location: [place];  Service: Orthopedics;  Laterality: Left;   TUBAL LIGATION     tubes removed stated patient    Gynecologic History: No LMP recorded. Patient is postmenopausal.  Obstetric History: G0P0000  Family History:  Family History  Problem Relation Age of Onset   Heart attack Father    Stroke Father     Social History:  Social History   Socioeconomic History   Marital status: Single    Spouse name: Not on file   Number of children: Not on file   Years of education: Not on file   Highest education level: Not on file  Occupational History   Not on file  Tobacco Use    Smoking status: Former    Packs/day: 1.00    Years: 30.00    Pack years: 30.00    Types: Cigarettes   Smokeless tobacco: Never  Vaping Use   Vaping Use: Never used  Substance and Sexual Activity   Alcohol use: Not Currently   Drug use: No   Sexual activity: Yes    Birth control/protection: None  Other Topics Concern   Not on file  Social History Narrative   Not on file   Social Determinants of Health   Financial Resource Strain: Not on file  Food Insecurity: Not on file  Transportation Needs: Not on file  Physical Activity: Not on file  Stress: Not on file  Social Connections: Not on file  Intimate Partner Violence: Not on file    Allergies:  Allergies  Allergen Reactions   Augmentin [Amoxicillin-Pot Clavulanate] Anaphylaxis    Did it involve swelling of the face/tongue/throat, SOB, or low BP? Yes Did it involve sudden or severe rash/hives, skin peeling, or any reaction on the inside of your mouth or nose? No Did you need to seek medical attention at a hospital or doctor's office? Yes When did it last happen? 15 years ago      If all above answers are NO, may proceed with cephalosporin use.    Bee Venom Anaphylaxis, Hives and Shortness Of Breath    Treats with benadryl   Diclofenac Sodium Itching and Swelling    Can tolerate Voltaren  Cannot use PENNSAID- topical gel   Other Anaphylaxis, Hives and Shortness Of Breath    Reaction to spider bites (treats with benadryl)    Medications: Prior to Admission medications   Medication Sig Start Date End Date Taking? Authorizing Provider  albuterol (PROVENTIL) (2.5 MG/3ML) 0.083% nebulizer solution Inhale 3 mLs into the lungs every 4 (four) hours as needed for shortness of breath. 11/01/20   [provider]  b complex vitamins tablet Take 1 tablet by mouth daily.    [provider]  chlorpheniramine-HYDROcodone (TUSSIONEX) 10-8 MG/5ML SUER Take 5 mLs by mouth every 12 (twelve) hours as needed for cough.     [provider]  Cholecalciferol (DIALYVITE VITAMIN D 5000) 125 MCG (5000 UT) capsule Take 5,000 Units by mouth daily.    [provider]  cyclobenzaprine (FLEXERIL) 10 MG tablet Take 1 tablet (10 mg total) by mouth 3 (three) times daily as needed for muscle spasms. 09/21/21   [name], MD  diclofenac Sodium (VOLTAREN) 1 % GEL Apply 4 g topically 3 (three) times daily as needed (pain). 02/06/21   [provider]  fluticasone (FLONASE) 50 MCG/ACT nasal spray Place 2 sprays into both nostrils daily as needed for allergies.    [provider]  hydrochlorothiazide (HYDRODIURIL) 25 MG tablet Take 25 mg by mouth daily.    [provider]  ibuprofen (ADVIL) 800 MG tablet Take 800 mg by mouth every 8 (eight) hours as needed for moderate pain.    [provider]  losartan (COZAAR) 100 MG tablet Take 100 mg by mouth daily.    [provider]  Multiple Vitamin (MULTIVITAMIN WITH MINERALS) TABS tablet Take 1 tablet by mouth daily.    [provider]  NARCAN 4 MG/0.1ML LIQD nasal spray kit Place 1 spray into the nose as needed (opioid overdose). 05/21/20   [provider]  nicotine (NICODERM CQ - DOSED IN MG/24 HR) 7 mg/24hr patch Place 7 mg onto the skin daily.    [provider]  oxyCODONE-acetaminophen (PERCOCET) 10-325 MG tablet Take 1 tablet by mouth every 4 (four) hours. 08/10/20   [provider]  phentermine (ADIPEX-P) 37.5 MG tablet Take 1 tablet (37.5 mg total) by mouth daily before breakfast. 09/21/21   [name], MD  torsemide (DEMADEX) 10 MG tablet Take 1 tablet (10 mg total) by mouth daily. 09/01/20   Kate Sable, MD  traZODone (DESYREL) 100 MG tablet Take 2 tablets (200 mg total) by mouth at bedtime as needed. for sleep 07/10/21   [name], MD  VENTOLIN HFA 108 [phone] Base) MCG/ACT inhaler Inhale 2 puffs into the lungs every 4 (four) hours as needed for wheezing or shortness  of breath.  03/19/20   [provider]    Physical Exam Height _0  (1.549 m), weight 210 lb (95.3 kg). Wt Readings from Last 3 Encounters:  10/21/21 210 lb (95.3 kg)  09/21/21 212 lb (96.2 kg)  08/20/21 205 lb (93 kg)  Body mass index is 39.68 kg/m.  No physical exam as this was a remote telephone visit to promote social distancing during the current COVID-19 Pandemic   Assessment: 55 y.o. G0P0000 medication follow up obesity   Plan: Problem List Items Addressed This Visit   None Visit Diagnoses     Class 2 severe obesity with serious comorbidity and body mass index (BMI) of 39.0 to 39.9 in adult, unspecified obesity type ([place])    -  Primary   Relevant Medications   phentermine (ADIPEX-P) 37.5 MG tablet       1) 1500 Calorie ADA Diet  2) Patient education given regarding appropriate lifestyle changes for weight loss including: regular physical activity, healthy coping strategies, caloric restriction and healthy eating patterns.  3) Patient will be started on weight loss medication. The risks and benefits and side effects of medication, such as Adipex (Phenteramine) ,  Tenuate (Diethylproprion), Belviq (lorcarsin), Contrave (buproprion/naltrexone), Qsymia (phentermine/topiramate), and Saxenda (liraglutide) is discussed. The pros and cons of suppressing appetite and boosting metabolism is discussed. Risks of tolerence and addiction is discussed for selected agents discussed. Use of medicine will ne short term, such as 3-4 months at a time followed by a period of time off of the medicine to avoid these risks and side effects for Adipex, Qsymia, and Tenuate discussed. Pt to call with any negative side effects and agrees to keep follow up appts.  4) Patient to take medication, with the benefits of appetite suppression and metabolism boost d/w pt, along with the side effects and risk factors of long term use that will be avoided with our use of short bursts of therapy. Rx  provided.    5) Telephone: 11:73mn  6) Return in about 4 weeks (around 11/18/2021) for Medication follow up phone.    AMalachy Mood MD, [facility]ardon[facility], [facility] 10/21/2021,  3:03 PM

## 2021-11-04 ENCOUNTER — Other Ambulatory Visit: Payer: Self-pay | Admitting: Orthopedic Surgery

## 2021-11-05 ENCOUNTER — Telehealth: Payer: Self-pay | Admitting: Cardiology

## 2021-11-05 NOTE — Telephone Encounter (Signed)
Primary Cardiologist:Brian Agbor-Etang, MD  Chart reviewed as part of pre-operative protocol coverage. Because of Shekelia K Kempfer's past medical history and time since last visit, he/she will require a follow-up visit in order to better assess preoperative cardiovascular risk.  Pre-op covering staff: - Please schedule appointment and call patient to inform them. - Please contact requesting surgeon's office via preferred method (i.e, phone, fax) to inform them of need for appointment prior to surgery.  -Additionally, this request is for pharmacy clearance only.The patient is not taking any medications that would need to be held from a cardiac perspective. Please have surgeon's office provide more details regarding the request including risk stratification and type of anesthesia to be used if they would like formal cardiac clearance.   If applicable, this message will also be routed to pharmacy pool and/or primary cardiologist for input on holding anticoagulant/antiplatelet agent as requested below so that this information is available at time of patient's appointment.   Lenna Sciara, NP  11/05/2021, 2:54 PM

## 2021-11-05 NOTE — Telephone Encounter (Signed)
° °  Pre-operative Risk Assessment    Patient Name: [name]  DOB: [birth year] MRN: [id number]      Request for Surgical Clearance    Procedure:   Right Total Hip Arthroplasty  Date of Surgery:  Clearance TBD                                 Surgeon:  Dr Lowella Petties Surgeon's Group or Practice Name:  [facility]  Phone number:  [phone] Fax number:  [phone]   Type of Clearance Requested:   - Pharmacy:  Hold not listed  please advise if needed   Type of Anesthesia:  Not Indicated   Additional requests/questions:    SignedAce Gins   11/05/2021, 2:46 PM

## 2021-11-05 NOTE — Telephone Encounter (Signed)
Left message for the pt to call the office and schedule an appt for pre op clearance with Dr. [name] or APP. I will send FYI to requesting office the pt will need an appt.

## 2021-11-10 NOTE — Progress Notes (Signed)
Cardiology Office Note    Date:  11/11/2021   ID:  [name], DOB Oct 20, 1966, MRN [id number]  PCP:  Pcp, No  Cardiologist:  Kate Sable, MD  Electrophysiologist:  None   Chief Complaint: Preoperative cardiac risk stratification  History of Present Illness:   [name] is a 56 y.o. female with history of mild mitral regurgitation, DM2, COVID, COPD with ongoing tobacco use for 30+ years, fibromyalgia, HTN, obesity, venous insufficiency, and varicose veins who presents for preoperative cardiac risk stratification for total hip arthroplasty.  She was initially evaluated in our office on 09/01/2020 for evaluation of lower extremity edema that had not improved with low-dose Lasix.  At that time, furosemide was discontinued and she was initiated on torsemide.  Echo on 09/17/2020 demonstrated an EF of 60 to 65%, no regional wall motion abnormalities, indeterminate LV diastolic function parameters, normal RV systolic function and ventricular cavity size, mildly elevated PASP, mild mitral regurgitation, mild aortic valve sclerosis without evidence of stenosis, and an estimated right atrial pressure of 3 mmHg.  Lower extremity venous reflux imaging in 09/2020 demonstrated venous insufficiency.  She was last seen in the office on 09/25/2020 and was without complaints.  Her swelling was significantly improved following the initiation of torsemide.  She is scheduled to undergo a total hip arthroplasty on 11/20/2021.  She comes in doing well from a cardiac perspective.  She is without symptoms of angina or decompensation.  No dizziness, presyncope, or syncope.  She has not had any further lower extremity swelling and is now managed with HCTZ through her PCP's office.  She remains very active at baseline without cardiac limitation.   Revised Cardiac Risk Index: She is low risk for noncardiac procedure with an estimated rate of 0.9 % of adverse cardiac event in the perioperative  timeframe  [facility] Activity Status Index: > 4 METs   Labs independently reviewed: 04/2021 - potassium 4.6, BUN 14, serum creatinine 0.67, albumin 4.2, AST/ALT normal, Hgb 12.4, PLT 356 12/2017 - A1c 5.9, TSH normal  Past Medical History:  Diagnosis Date   Arthritis    COPD (chronic obstructive pulmonary disease) ([place])    denies   COVID-19 10/23/2019   Asymptomatic   Environmental and seasonal allergies    Falls    Fibromyalgia    History of colon polyps 2017   Hypertension    Pneumonia    aspiration PNA   Wound infection    right foot    Past Surgical History:  Procedure Laterality Date   APPLICATION OF WOUND VAC Right 05/20/2020   Procedure: APPLICATION OF WOUND VAC TO RIGHT FOOT;  Surgeon: Erle Crocker, MD;  Location: [facility];  Service: Orthopedics;  Laterality: Right;   COLONOSCOPY WITH PROPOFOL N/A 09/03/2016   Procedure: COLONOSCOPY WITH PROPOFOL;  Surgeon: [name], MD;  Location: [facility];  Service: Endoscopy;  Laterality: N/A;   FRACTURE SURGERY     left foot    INCISION AND DRAINAGE Right 05/20/2020   Procedure: RIGHT FOOT INCISION AND DRAINAGE;  Surgeon: Erle Crocker, MD;  Location: [place];  Service: Orthopedics;  Laterality: Right;  LENGTH OF SURGERY: 1 HOUR   KNEE CARTILAGE SURGERY  2015   left   LAPAROSCOPIC SALPINGOOPHERECTOMY     left hand     1 for ganglion cyst and next yr replace "joints" in left hand   PLANTAR FASCIA SURGERY     left foot   SHOULDER INJECTION Right 05/17/2016   Procedure: SHOULDER  INJECTION;  Surgeon: Dorna Leitz, MD;  Location: [place];  Service: Orthopedics;  Laterality: Right;   SHOULDER SURGERY     x 2 on right shoulder   TONSILLECTOMY     TOTAL KNEE ARTHROPLASTY Left 05/17/2016   Procedure: TOTAL KNEE ARTHROPLASTY;  Surgeon: Dorna Leitz, MD;  Location: [place];  Service: Orthopedics;  Laterality: Left;   TOTAL KNEE ARTHROPLASTY Right 11/23/2019   Procedure: TOTAL KNEE ARTHROPLASTY;  Surgeon: Dorna Leitz, MD;   Location: [facility];  Service: Orthopedics;  Laterality: Right;   TOTAL SHOULDER ARTHROPLASTY Left 05/09/2015   Procedure: TOTAL SHOULDER ARTHROPLASTY;  Surgeon: Dorna Leitz, MD;  Location: [place];  Service: Orthopedics;  Laterality: Left;   TUBAL LIGATION     tubes removed stated patient    Current Medications: Current Meds  Medication Sig   acetaminophen (TYLENOL) 500 MG tablet Take 1,000 mg by mouth every 8 (eight) hours as needed for moderate pain.   albuterol (PROVENTIL) (2.5 MG/3ML) 0.083% nebulizer solution Inhale 3 mLs into the lungs every 4 (four) hours as needed for shortness of breath.   chlorpheniramine-HYDROcodone (TUSSIONEX) 10-8 MG/5ML SUER Take 5 mLs by mouth every 12 (twelve) hours as needed for cough.   clindamycin (CLEOCIN) 150 MG capsule Take 600 mg by mouth See admin instructions. 1 hour before dental procedure   cyclobenzaprine (FLEXERIL) 10 MG tablet Take 1 tablet (10 mg total) by mouth 3 (three) times daily as needed for muscle spasms.   diclofenac Sodium (VOLTAREN) 1 % GEL Apply 4 g topically 3 (three) times daily as needed (pain).   fluticasone (FLONASE) 50 MCG/ACT nasal spray Place 2 sprays into both nostrils daily as needed for allergies.   hydrochlorothiazide (HYDRODIURIL) 25 MG tablet Take 25 mg by mouth daily.   ibuprofen (ADVIL) 800 MG tablet Take 800 mg by mouth 2 (two) times daily as needed for moderate pain.   losartan (COZAAR) 100 MG tablet Take 100 mg by mouth daily.   NARCAN 4 MG/0.1ML LIQD nasal spray kit Place 1 spray into the nose as needed (opioid overdose).   nicotine (NICODERM CQ - DOSED IN MG/24 HOURS) 14 mg/24hr patch Place 14 mg onto the skin daily.   oxyCODONE-acetaminophen (PERCOCET) 10-325 MG tablet Take 1 tablet by mouth every 4 (four) hours as needed for pain.   sodium chloride (OCEAN) 0.65 % nasal spray Place 1 spray into the nose as needed for congestion.   traZODone (DESYREL) 100 MG tablet Take 2 tablets (200 mg total) by mouth at bedtime as  needed. for sleep   VENTOLIN HFA 108 (90 Base) MCG/ACT inhaler Inhale 2 puffs into the lungs every 4 (four) hours as needed for wheezing or shortness of breath.     Allergies:   Augmentin [amoxicillin-pot clavulanate], Bee venom, Diclofenac sodium, and Other   Social History   Socioeconomic History   Marital status: Single    Spouse name: Not on file   Number of children: Not on file   Years of education: Not on file   Highest education level: Not on file  Occupational History   Not on file  Tobacco Use   Smoking status: Former    Packs/day: 1.00    Years: 30.00    Pack years: 30.00    Types: Cigarettes   Smokeless tobacco: Never  Vaping Use   Vaping Use: Never used  Substance and Sexual Activity   Alcohol use: Yes    Comment: occassional glass of wine for special occassions   Drug use:  No   Sexual activity: Yes    Birth control/protection: None  Other Topics Concern   Not on file  Social History Narrative   Not on file   Social Determinants of Health   Financial Resource Strain: Not on file  Food Insecurity: Not on file  Transportation Needs: Not on file  Physical Activity: Not on file  Stress: Not on file  Social Connections: Not on file     Family History:  The patient's family history includes Crohn's disease in her mother; Heart attack in her father; Hypertension in her brother and brother; Stroke in her father.  ROS:   Review of Systems  Constitutional:  Negative for chills, diaphoresis, fever, malaise/fatigue and weight loss.  HENT:  Negative for congestion.   Eyes:  Negative for discharge and redness.  Respiratory:  Negative for cough, sputum production, shortness of breath and wheezing.   Cardiovascular:  Negative for chest pain, palpitations, orthopnea, claudication, leg swelling and PND.  Gastrointestinal:  Negative for abdominal pain, heartburn, nausea and vomiting.  Musculoskeletal:  Positive for joint pain. Negative for falls and myalgias.   Skin:  Negative for rash.  Neurological:  Negative for dizziness, tingling, tremors, sensory change, speech change, focal weakness, loss of consciousness and weakness.  Endo/Heme/Allergies:  Does not bruise/bleed easily.  Psychiatric/Behavioral:  Negative for substance abuse. The patient is not nervous/anxious.   All other systems reviewed and are negative.   EKGs/Labs/Other Studies Reviewed:    Studies reviewed were summarized above. The additional studies were reviewed today:  2D echo 09/17/2020: 1. Left ventricular ejection fraction, by estimation, is 60 to 65%. The  left ventricle has normal function. The left ventricle has no regional  wall motion abnormalities. Left ventricular diastolic parameters are  indeterminate.   2. Right ventricular systolic function is normal. The right ventricular  size is normal. There is mildly elevated pulmonary artery systolic  pressure.   3. The mitral valve is normal in structure. Mild mitral valve  regurgitation. No evidence of mitral stenosis.   4. The aortic valve is tricuspid. Aortic valve regurgitation is not  visualized. Mild aortic valve sclerosis is present, with no evidence of  aortic valve stenosis.   5. The inferior vena cava is normal in size with greater than 50%  respiratory variability, suggesting right atrial pressure of 3 mmHg.  EKG:  EKG is ordered today.  The EKG ordered today demonstrates sinus tachycardia, 102 bpm, normal axis, no acute ST-T changes  Recent Labs: 04/29/2021: ALT 17; BUN 14; Creatinine, Ser 0.67; Hemoglobin 12.4; Platelets 356; Potassium 4.6; Sodium 136  Recent Lipid Panel No results found for: CHOL, TRIG, HDL, CHOLHDL, VLDL, LDLCALC, LDLDIRECT  PHYSICAL EXAM:    VS:  BP 130/80 (BP Location: Left Arm, Patient Position: Sitting, Cuff Size: Large)    Pulse (!) 102    Ht 5' 1"  (1.549 m)    Wt 211 lb (95.7 kg)    SpO2 97%    BMI 39.87 kg/m   BMI: Body mass index is 39.87 kg/m.  Physical Exam Vitals  reviewed.  Constitutional:      Appearance: She is well-developed.  HENT:     Head: Normocephalic and atraumatic.  Eyes:     General:        Right eye: No discharge.        Left eye: No discharge.  Neck:     Vascular: No JVD.  Cardiovascular:     Rate and Rhythm: Normal rate and regular rhythm.  Pulses:          Posterior tibial pulses are 2+ on the right side and 2+ on the left side.     Heart sounds: Normal heart sounds, S1 normal and S2 normal. Heart sounds not distant. No midsystolic click and no opening snap. No murmur heard.   No friction rub.  Pulmonary:     Effort: Pulmonary effort is normal. No respiratory distress.     Breath sounds: Normal breath sounds. No decreased breath sounds, wheezing or rales.  Chest:     Chest wall: No tenderness.  Abdominal:     General: There is no distension.     Palpations: Abdomen is soft.     Tenderness: There is no abdominal tenderness.  Musculoskeletal:     Cervical back: Normal range of motion.     Right lower leg: No edema.     Left lower leg: No edema.  Skin:    General: Skin is warm and dry.     Nails: There is no clubbing.  Neurological:     Mental Status: She is alert and oriented to person, place, and time.  Psychiatric:        Speech: Speech normal.        Behavior: Behavior normal.        Thought Content: Thought content normal.        Judgment: Judgment normal.    Wt Readings from Last 3 Encounters:  11/11/21 211 lb (95.7 kg)  10/21/21 210 lb (95.3 kg)  09/21/21 212 lb (96.2 kg)     ASSESSMENT & PLAN:   Preoperative cardiac risk stratification.  She is scheduled to undergo a total hip arthroplasty on 11/20/2021.  Per Revised Cardiac Risk Index, she is low risk for noncardiac surgery with an estimated rate of 0.9 % for adverse cardiac event in the perioperative timeframe.  Per [facility] Activity Status Index, she can achieve > 4 METs without cardiac limitation.  She may proceed with noncardiac surgery at an overall  low risk without any further cardiac testing or intervention.  Bilateral lower extremity edema: Resolved.  She remains on HCTZ which is managed by her PCP.  HTN: Blood pressure is reasonably controlled in the office today.  She remains on HCTZ and losartan.  Managed by her PCP.  Obesity: Her weight is down 18 pounds today when compared to her last clinic visit.  Continued weight loss is encouraged through heart of the diet and regular exercise, following her hip arthroplasty.    Disposition: F/u with Dr. Garen Lah or an APP in 12 months, sooner if needed.   Medication Adjustments/Labs and Tests Ordered: Current medicines are reviewed at length with the patient today.  Concerns regarding medicines are outlined above. Medication changes, Labs and Tests ordered today are summarized above and listed in the Patient Instructions accessible in Encounters.   Signed, Christell Faith, PA-C 11/11/2021 1:11 PM     [address] [place], [place] [id number] [phone]

## 2021-11-10 NOTE — Telephone Encounter (Signed)
Pt is scheduled to see Christell Faith, PA-C, 11/11/21, and clearance will be addressed at that time.  Will route back to the requesting surgeon's office to make them aware.

## 2021-11-11 ENCOUNTER — Encounter: Payer: Self-pay | Admitting: Physician Assistant

## 2021-11-11 ENCOUNTER — Ambulatory Visit (INDEPENDENT_AMBULATORY_CARE_PROVIDER_SITE_OTHER): Payer: BC Managed Care – PPO | Admitting: Physician Assistant

## 2021-11-11 ENCOUNTER — Other Ambulatory Visit: Payer: Self-pay

## 2021-11-11 VITALS — BP 130/80 | HR 102 | Ht 61.0 in | Wt 211.0 lb

## 2021-11-11 DIAGNOSIS — Z01810 Encounter for preprocedural cardiovascular examination: Secondary | ICD-10-CM

## 2021-11-11 DIAGNOSIS — I10 Essential (primary) hypertension: Secondary | ICD-10-CM | POA: Diagnosis not present

## 2021-11-11 DIAGNOSIS — E669 Obesity, unspecified: Secondary | ICD-10-CM

## 2021-11-11 DIAGNOSIS — Z6839 Body mass index (BMI) 39.0-39.9, adult: Secondary | ICD-10-CM

## 2021-11-11 DIAGNOSIS — R600 Localized edema: Secondary | ICD-10-CM

## 2021-11-11 NOTE — Patient Instructions (Addendum)
DUE TO COVID-19 ONLY ONE VISITOR IS ALLOWED TO COME WITH YOU AND STAY IN THE WAITING ROOM ONLY DURING PRE OP AND PROCEDURE.   **NO VISITORS ARE ALLOWED IN THE SHORT STAY AREA OR RECOVERY ROOM!!**       Your procedure is scheduled on: 11/20/21   Report to [facility] Main Entrance    Report to admitting at 9:35 AM   Call this number if you have problems the morning of surgery [phone]   Do not eat food :After Midnight.   May have clear liquids until 9:20 AM day of surgery  CLEAR LIQUID DIET  Foods Allowed                                                                     Foods Excluded  Water, Black Coffee and tea (no milk or creamer)           liquids that you cannot  Plain Jell-O in any flavor  (No red)                                    see through such as: Fruit ices (not with fruit pulp)                                            milk, soups, orange juice              Iced Popsicles (No red)                                                All solid food                                   Apple juices Sports drinks like Gatorade (No red) Lightly seasoned clear broth or consume(fat free) Sugar     The day of surgery:  Drink ONE (1) Pre-Surgery Clear Ensure by 9:20 am the morning of surgery. Drink in one sitting. Do not sip.  This drink was given to you during your hospital  pre-op appointment visit. Nothing else to drink after completing the  Pre-Surgery Clear Ensure.          If you have questions, please contact your surgeons office.     Oral Hygiene is also important to reduce your risk of infection.                                    Remember - BRUSH YOUR TEETH THE MORNING OF SURGERY WITH YOUR REGULAR TOOTHPASTE   Stop taking all vitamins and supplements 7 days before surgery   Take these medicines the morning of surgery with A SIP OF WATER: Tylenol, inhalers, Percocet  You may not have any metal on your body including hair  pins, jewelry, and body piercing             Do not wear make-up, lotions, powders, perfumes, or deodorant  Do not wear nail polish including gel and S&S, artificial/acrylic nails, or any other type of covering on natural nails including finger and toenails. If you have artificial nails, gel coating, etc. that needs to be removed by a nail salon please have this removed prior to surgery or surgery may need to be canceled/ delayed if the surgeon/ anesthesia feels like they are unable to be safely monitored.   Do not shave  48 hours prior to surgery.    Do not bring valuables to the hospital. [place].    Patients discharged on the day of surgery will not be allowed to drive home.  We recommend you have a responsible adult to stay with you for the first 24 hours after anesthesia.   Special Instructions: Bring a copy of your healthcare power of attorney and living will documents         the day of surgery if you haven't scanned them before.              Please read over the following fact sheets you were given: IF YOU HAVE QUESTIONS ABOUT YOUR PRE-OP INSTRUCTIONS PLEASE CALL [facility] - Preparing for Surgery Before surgery, you can play an important role.  Because skin is not sterile, your skin needs to be as free of germs as possible.  You can reduce the number of germs on your skin by washing with CHG (chlorahexidine gluconate) soap before surgery.  CHG is an antiseptic cleaner which kills germs and bonds with the skin to continue killing germs even after washing. Please DO NOT use if you have an allergy to CHG or antibacterial soaps.  If your skin becomes reddened/irritated stop using the CHG and inform your nurse when you arrive at Short Stay. Do not shave (including legs and underarms) for at least 48 hours prior to the first CHG shower.  You may shave your face/neck.  Please follow these instructions carefully:  1.   Shower with CHG Soap the night before surgery and the  morning of surgery.  2.  If you choose to wash your hair, wash your hair first as usual with your normal  shampoo.  3.  After you shampoo, rinse your hair and body thoroughly to remove the shampoo.                             4.  Use CHG as you would any other liquid soap.  You can apply chg directly to the skin and wash.  Gently with a scrungie or clean washcloth.  5.  Apply the CHG Soap to your body ONLY FROM THE NECK DOWN.   Do   not use on face/ open                           Wound or open sores. Avoid contact with eyes, ears mouth and   genitals (private parts).                       Wash face,  Genitals (private parts) with your normal soap.             6.  Wash thoroughly, paying special attention to the area where your    surgery  will be performed.  7.  Thoroughly rinse your body with warm water from the neck down.  8.  DO NOT shower/wash with your normal soap after using and rinsing off the CHG Soap.                9.  Pat yourself dry with a clean towel.            10.  Wear clean pajamas.            11.  Place clean sheets on your bed the night of your first shower and do not  sleep with pets. Day of Surgery : Do not apply any lotions/deodorants the morning of surgery.  Please wear clean clothes to the hospital/surgery center.  FAILURE TO FOLLOW THESE INSTRUCTIONS MAY RESULT IN THE CANCELLATION OF YOUR SURGERY  PATIENT SIGNATURE_________________________________  NURSE SIGNATURE__________________________________  ________________________________________________________________________   [name]  An incentive spirometer is a tool that can help keep your lungs clear and active. This tool measures how well you are filling your lungs with each breath. Taking long deep breaths may help reverse or decrease the chance of developing breathing (pulmonary) problems (especially infection) following: A long period of time  when you are unable to move or be active. BEFORE THE PROCEDURE  If the spirometer includes an indicator to show your best effort, your nurse or respiratory therapist will set it to a desired goal. If possible, sit up straight or lean slightly forward. Try not to slouch. Hold the incentive spirometer in an upright position. INSTRUCTIONS FOR USE  Sit on the edge of your bed if possible, or sit up as far as you can in bed or on a chair. Hold the incentive spirometer in an upright position. Breathe out normally. Place the mouthpiece in your mouth and seal your lips tightly around it. Breathe in slowly and as deeply as possible, raising the piston or the ball toward the top of the column. Hold your breath for 3-5 seconds or for as long as possible. Allow the piston or ball to fall to the bottom of the column. Remove the mouthpiece from your mouth and breathe out normally. Rest for a few seconds and repeat Steps 1 through 7 at least 10 times every 1-2 hours when you are awake. Take your time and take a few normal breaths between deep breaths. The spirometer may include an indicator to show your best effort. Use the indicator as a goal to work toward during each repetition. After each set of 10 deep breaths, practice coughing to be sure your lungs are clear. If you have an incision (the cut made at the time of surgery), support your incision when coughing by placing a pillow or rolled up towels firmly against it. Once you are able to get out of bed, walk around indoors and cough well. You may stop using the incentive spirometer when instructed by your caregiver.  RISKS AND COMPLICATIONS Take your time so you do not get dizzy or light-headed. If you are in pain, you may need to take or ask for pain medication before doing incentive spirometry. It is harder to take a deep breath if you are having pain. AFTER USE Rest and breathe slowly and easily. It can be helpful to keep track of  a log of your  progress. Your caregiver can provide you with a simple table to help with this. If you are using the spirometer at home, follow these instructions: Ball IF:  You are having difficultly using the spirometer. You have trouble using the spirometer as often as instructed. Your pain medication is not giving enough relief while using the spirometer. You develop fever of 100.5 F (38.1 C) or higher. SEEK IMMEDIATE MEDICAL CARE IF:  You cough up bloody sputum that had not been present before. You develop fever of 102 F (38.9 C) or greater. You develop worsening pain at or near the incision site. MAKE SURE YOU:  Understand these instructions. Will watch your condition. Will get help right away if you are not doing well or get worse. Document Released: 03/07/2007 Document Revised: 01/17/2012 Document Reviewed: 05/08/2007 Exit[employer] Patient Information 2014 Exit[employer], [place].   ________________________________________________________________________  WHAT IS A BLOOD TRANSFUSION? Blood Transfusion Information  A transfusion is the replacement of blood or some of its parts. Blood is made up of multiple cells which provide different functions. Red blood cells carry oxygen and are used for blood loss replacement. White blood cells fight against infection. Platelets control bleeding. Plasma helps clot blood. Other blood products are available for specialized needs, such as hemophilia or other clotting disorders. BEFORE THE TRANSFUSION  Who gives blood for transfusions?  Healthy volunteers who are fully evaluated to make sure their blood is safe. This is blood bank blood. Transfusion therapy is the safest it has ever been in the practice of medicine. Before blood is taken from a donor, a complete history is taken to make sure that person has no history of diseases nor engages in risky social behavior (examples are intravenous drug use or sexual activity with multiple partners). The donor's  travel history is screened to minimize risk of transmitting infections, such as malaria. The donated blood is tested for signs of infectious diseases, such as HIV and hepatitis. The blood is then tested to be sure it is compatible with you in order to minimize the chance of a transfusion reaction. If you or a relative donates blood, this is often done in anticipation of surgery and is not appropriate for emergency situations. It takes many days to process the donated blood. RISKS AND COMPLICATIONS Although transfusion therapy is very safe and saves many lives, the main dangers of transfusion include:  Getting an infectious disease. Developing a transfusion reaction. This is an allergic reaction to something in the blood you were given. Every precaution is taken to prevent this. The decision to have a blood transfusion has been considered carefully by your caregiver before blood is given. Blood is not given unless the benefits outweigh the risks. AFTER THE TRANSFUSION Right after receiving a blood transfusion, you will usually feel much better and more energetic. This is especially true if your red blood cells have gotten low (anemic). The transfusion raises the level of the red blood cells which carry oxygen, and this usually causes an energy increase. The nurse administering the transfusion will monitor you carefully for complications. HOME CARE INSTRUCTIONS  No special instructions are needed after a transfusion. You may find your energy is better. Speak with your caregiver about any limitations on activity for underlying diseases you may have. SEEK MEDICAL CARE IF:  Your condition is not improving after your transfusion. You develop redness or irritation at the intravenous (IV) site. SEEK IMMEDIATE MEDICAL CARE IF:  Any of the following symptoms occur  over the next 12 hours: Shaking chills. You have a temperature by mouth above 102 F (38.9 C), not controlled by medicine. Chest, back, or muscle  pain. People around you feel you are not acting correctly or are confused. Shortness of breath or difficulty breathing. Dizziness and fainting. You get a rash or develop hives. You have a decrease in urine output. Your urine turns a dark color or changes to pink, red, or brown. Any of the following symptoms occur over the next 10 days: You have a temperature by mouth above 102 F (38.9 C), not controlled by medicine. Shortness of breath. Weakness after normal activity. The white part of the eye turns yellow (jaundice). You have a decrease in the amount of urine or are urinating less often. Your urine turns a dark color or changes to pink, red, or brown. Document Released: 10/22/2000 Document Revised: 01/17/2012 Document Reviewed: 06/10/2008 [facility] Patient Information 2014 [place], [place].  _______________________________________________________________________

## 2021-11-11 NOTE — Progress Notes (Addendum)
COVID swab appointment: n/a  COVID Vaccine Completed: yes x3 Date COVID Vaccine completed: Has received booster: COVID vaccine manufacturer: [employer]   Date of COVID positive in last 90 days: no  PCP - working on getting one Cardiologist - Kate Sable, MD  Cardiac clearance 11/11/21 by Christell Faith in Epic  Chest x-ray - 04/29/21 Epic EKG - 11/11/21 Epic Stress Test - n/a ECHO - 09/17/20 Epic Cardiac Cath - n/a Pacemaker/ICD device last checked: n/a Spinal Cord Stimulator: n/a  Sleep Study - yes, negative CPAP -   Fasting Blood Sugar - n/a Checks Blood Sugar _____ times a day  Blood Thinner Instructions: n/a Aspirin Instructions: Last Dose:  Last dose of Phentermine 11/08/21  Activity level: Can go up a flight of stairs and perform activities of daily living without stopping and without symptoms of chest pain or shortness of breath.     Anesthesia review: COPD, HTN, mitral valve prolapse  Patient denies shortness of breath, fever, cough and chest pain at PAT appointment   Patient verbalized understanding of instructions that were given to them at the PAT appointment. Patient was also instructed that they will need to review over the PAT instructions again at home before surgery.

## 2021-11-11 NOTE — Patient Instructions (Signed)
Medication Instructions:  - Your physician recommends that you continue on your current medications as directed. Please refer to the Current Medication list given to you today.  *If you need a refill on your cardiac medications before your next appointment, please call your pharmacy*   Lab Work: - none ordered  If you have labs (blood work) drawn today and your tests are completely normal, you will receive your results only by: [place] (if you have MyChart) OR A paper copy in the mail If you have any lab test that is abnormal or we need to change your treatment, we will call you to review the results.   Testing/Procedures: - none ordered   Follow-Up: At [facility], you and your health needs are our priority.  As part of our continuing mission to provide you with exceptional heart care, we have created designated Provider Care Teams.  These Care Teams include your primary Cardiologist (physician) and Advanced Practice Providers (APPs -  Physician Assistants and Nurse Practitioners) who all work together to provide you with the care you need, when you need it.  We recommend signing up for the patient portal called "MyChart".  Sign up information is provided on this After Visit Summary.  MyChart is used to connect with patients for Virtual Visits (Telemedicine).  Patients are able to view lab/test results, encounter notes, upcoming appointments, etc.  Non-urgent messages can be sent to your provider as well.   To learn more about what you can do with MyChart, go to [email or web address].    Your next appointment:   1 year(s)  The format for your next appointment:   In Person  Provider:   You may see Kate Sable, MD or one of the following Advanced Practice Providers on your designated Care Team:   Murray Hodgkins, NP Christell Faith, PA-C Cadence Kathlen Mody, [place]    Other Instructions N/a

## 2021-11-12 ENCOUNTER — Encounter (HOSPITAL_COMMUNITY): Payer: Self-pay

## 2021-11-12 ENCOUNTER — Telehealth: Payer: Self-pay | Admitting: Pulmonary Disease

## 2021-11-12 ENCOUNTER — Encounter (HOSPITAL_COMMUNITY)
Admission: RE | Admit: 2021-11-12 | Discharge: 2021-11-12 | Disposition: A | Discharge status: Home / Self Care | Payer: BC Managed Care – PPO | Source: Ambulatory Visit | Attending: Orthopedic Surgery | Admitting: Orthopedic Surgery

## 2021-11-12 VITALS — BP 144/78 | HR 97 | Temp 98.3°F | Resp 20 | Ht 61.0 in | Wt 208.2 lb

## 2021-11-12 DIAGNOSIS — I2510 Atherosclerotic heart disease of native coronary artery without angina pectoris: Secondary | ICD-10-CM | POA: Insufficient documentation

## 2021-11-12 DIAGNOSIS — Z01812 Encounter for preprocedural laboratory examination: Secondary | ICD-10-CM | POA: Insufficient documentation

## 2021-11-12 DIAGNOSIS — Z01818 Encounter for other preprocedural examination: Secondary | ICD-10-CM

## 2021-11-12 LAB — CBC
HCT: 39.5 % (ref 36.0–46.0)
Hemoglobin: 13 g/dL (ref 12.0–15.0)
MCH: 31.4 pg (ref 26.0–34.0)
MCHC: 32.9 g/dL (ref 30.0–36.0)
MCV: 95.4 fL (ref 80.0–100.0)
Platelets: 361 10*3/uL (ref 150–400)
RBC: 4.14 MIL/uL (ref 3.87–5.11)
RDW: 12 % (ref 11.5–15.5)
WBC: 9.9 10*3/uL (ref 4.0–10.5)
nRBC: 0 % (ref 0.0–0.2)

## 2021-11-12 LAB — BASIC METABOLIC PANEL
Anion gap: 5 (ref 5–15)
BUN: 18 mg/dL (ref 6–20)
CO2: 29 mmol/L (ref 22–32)
Calcium: 9.1 mg/dL (ref 8.9–10.3)
Chloride: 103 mmol/L (ref 98–111)
Creatinine, Ser: 1.13 mg/dL — ABNORMAL HIGH (ref 0.44–1.00)
GFR, Estimated: 57 mL/min — ABNORMAL LOW (ref >60)
Glucose, Bld: 126 mg/dL — ABNORMAL HIGH (ref 70–99)
Potassium: 4.2 mmol/L (ref 3.5–5.1)
Sodium: 137 mmol/L (ref 135–145)

## 2021-11-12 LAB — SURGICAL PCR SCREEN
MRSA, PCR: POSITIVE — AB
Staphylococcus aureus: POSITIVE — AB

## 2021-11-12 NOTE — Telephone Encounter (Signed)
Will address at [place] 11/13/21 - thanks!

## 2021-11-12 NOTE — Telephone Encounter (Signed)
Fax received from Dr. Dorna Leitz with [facility] to perform a Right Total hip arthroplasty on patient.  Patient needs surgery clearance. Patient was seen on 09/26/2020 and is scheduled for OV on 11/13/2021 at 2:45 with Dr. Silas Flood. Office protocol is a risk assessment can be sent to surgeon if patient has been seen in 60 days or less.   Sending to Dr. Silas Flood for risk assessment

## 2021-11-13 ENCOUNTER — Other Ambulatory Visit: Payer: Self-pay

## 2021-11-13 ENCOUNTER — Encounter: Payer: Self-pay | Admitting: Pulmonary Disease

## 2021-11-13 ENCOUNTER — Ambulatory Visit: Payer: BC Managed Care – PPO | Admitting: Pulmonary Disease

## 2021-11-13 VITALS — BP 120/70 | HR 111 | Temp 98.1°F | Ht 61.0 in | Wt 208.0 lb

## 2021-11-13 DIAGNOSIS — R0609 Other forms of dyspnea: Secondary | ICD-10-CM | POA: Diagnosis not present

## 2021-11-13 DIAGNOSIS — Z01818 Encounter for other preprocedural examination: Secondary | ICD-10-CM | POA: Diagnosis not present

## 2021-11-13 NOTE — Progress Notes (Signed)
_0  ID: [name], female    DOB: [birth year], 56 y.o.   MRN: [id number]  Chief Complaint  Patient presents with   surgical clearance    Surgical clearance for a left hip replacement scheduled for next friday    Referring provider: No ref. provider found  HPI:   56 year old whom we are seeing in follow-up dyspnea on exertion as well as preoperative evaluation.  Most recent cardiology note reviewed.  She is doing well.  Shortness of breath a bit better with Anoro.  Also endorses weight loss which is helped.  She is congratulated on this.  She is here today for preoperative evaluation of hip replacement.  HPI initial visit: Patient states breathing is at baseline.  Denies significant dyspnea.  No real exertional limitation.  She is used Spiriva DPI in the past unsure if it helped much.  Currently prescribed Spiriva Respimat but a lot of sensation of the chest in the back of her throat.  Not using really.  Told by her PCP that she had COPD and this prompted a referral.  She denies any history spirometry or breathing test.  She was told she is done in the past but she does not recall this.  None to review in the EMR at this time.  Last x-ray in EMR 05/07/2016 reviewed and interpreted as clear lungs, mild hyperinflation on lateral view with increased lucency between heart and anterior chest.  Reviewed TTE 09/17/2020 which was essentially normal, no clear cause for dyspnea.  PMH: tobacco abuse, HTN Surgical history: left foot surgery, knee surgery Family history: CAD, stroke in father Social history: Lives in [place], current every day smoker, ~30 pack year       Questionaires / Pulmonary Flowsheets:   ACT:  No flowsheet data found.  MMRC: No flowsheet data found.  Epworth:  No flowsheet data found.  Tests:   FENO:  No results found for: NITRICOXIDE  PFT: No flowsheet data found.  WALK:  No flowsheet data found.  Imaging: Personally reviewed and as  per EMR and discussion in this note No results found.   Lab Results: Personally reviewed, notably eos 400 07/2020 CBC    Component Value Date/Time   WBC 9.9 11/12/2021 1332   RBC 4.14 11/12/2021 1332   HGB 13.0 11/12/2021 1332   HGB 14.6 09/20/2012 1134   HCT 39.5 11/12/2021 1332   HCT 44.0 09/20/2012 1134   PLT 361 11/12/2021 1332   PLT 377 09/20/2012 1134   MCV 95.4 11/12/2021 1332   MCV 94 09/20/2012 1134   MCH 31.4 11/12/2021 1332   MCHC 32.9 11/12/2021 1332   RDW 12.0 11/12/2021 1332   RDW 13.0 09/20/2012 1134   LYMPHSABS 3.2 04/29/2021 0846   MONOABS 0.8 04/29/2021 0846   EOSABS 0.3 04/29/2021 0846   BASOSABS 0.0 04/29/2021 0846    BMET    Component Value Date/Time   NA 137 11/12/2021 1332   NA 139 09/20/2012 1134   K 4.2 11/12/2021 1332   K 4.4 09/20/2012 1134   CL 103 11/12/2021 1332   CL 103 09/20/2012 1134   CO2 29 11/12/2021 1332   CO2 28 09/20/2012 1134   GLUCOSE 126 (H) 11/12/2021 1332   GLUCOSE 87 09/20/2012 1134   BUN 18 11/12/2021 1332   BUN 17 09/20/2012 1134   CREATININE 1.13 (H) 11/12/2021 1332   CREATININE 0.72 09/20/2012 1134   CALCIUM 9.1 11/12/2021 1332   CALCIUM 8.8 09/20/2012 1134   GFRNONAA 57 (L)  11/12/2021 1332   GFRNONAA >60 09/20/2012 1134   GFRAA >60 07/21/2020 1147   GFRAA >60 09/20/2012 1134    BNP No results found for: BNP  ProBNP No results found for: PROBNP  Specialty Problems       Pulmonary Problems   OTHER RESPIRATORY COMPLICATIONS    Qualifier: Diagnosis of  By: Ta MD, Cat         Allergies  Allergen Reactions   Augmentin [Amoxicillin-Pot Clavulanate] Anaphylaxis    Did it involve swelling of the face/tongue/throat, SOB, or low BP? Yes Did it involve sudden or severe rash/hives, skin peeling, or any reaction on the inside of your mouth or nose? No Did you need to seek medical attention at a hospital or doctor's office? Yes When did it last happen? 15 years ago      If all above answers are NO, may  proceed with cephalosporin use.    Bee Venom Anaphylaxis, Hives and Shortness Of Breath    Treats with benadryl   Diclofenac Sodium Itching and Swelling    Can tolerate Voltaren  Cannot use PENNSAID- topical gel   Other Anaphylaxis, Hives and Shortness Of Breath    Reaction to spider bites (treats with benadryl)    Immunization History  Administered Date(s) Administered   Hepb-cpg 04/06/2018, 06/20/2018   Tdap 06/09/2011   Zoster Recombinat (Shingrix) 06/20/2018, 10/20/2018    Past Medical History:  Diagnosis Date   Arthritis    COPD (chronic obstructive pulmonary disease) ([place])    denies   COVID-19 10/23/2019   Asymptomatic   Environmental and seasonal allergies    Falls    Fibromyalgia    History of colon polyps 2017   Hypertension    Pneumonia    aspiration PNA   Wound infection    right foot    Tobacco History: Social History   Tobacco Use  Smoking Status Former   Packs/day: 1.00   Years: 30.00   Pack years: 30.00   Types: Cigarettes   Quit date: 11/03/2021   Years since quitting: 0.0  Smokeless Tobacco Never   Counseling given: Not Answered   Continue to not smoke  Outpatient Encounter Medications as of 11/13/2021  Medication Sig   acetaminophen (TYLENOL) 500 MG tablet Take 1,000 mg by mouth every 8 (eight) hours as needed for moderate pain.   albuterol (PROVENTIL) (2.5 MG/3ML) 0.083% nebulizer solution Inhale 3 mLs into the lungs every 4 (four) hours as needed for shortness of breath.   b complex vitamins tablet Take 1 tablet by mouth daily.   chlorpheniramine-HYDROcodone (TUSSIONEX) 10-8 MG/5ML SUER Take 5 mLs by mouth every 12 (twelve) hours as needed for cough.   Cholecalciferol (DIALYVITE VITAMIN D 5000) 125 MCG (5000 UT) capsule Take 5,000 Units by mouth daily.   clindamycin (CLEOCIN) 150 MG capsule Take 600 mg by mouth See admin instructions. 1 hour before dental procedure   cyclobenzaprine (FLEXERIL) 10 MG tablet Take 1 tablet (10 mg total) by  mouth 3 (three) times daily as needed for muscle spasms.   diclofenac Sodium (VOLTAREN) 1 % GEL Apply 4 g topically 3 (three) times daily as needed (pain).   fluticasone (FLONASE) 50 MCG/ACT nasal spray Place 2 sprays into both nostrils daily as needed for allergies.   hydrochlorothiazide (HYDRODIURIL) 25 MG tablet Take 25 mg by mouth daily.   ibuprofen (ADVIL) 800 MG tablet Take 800 mg by mouth 2 (two) times daily as needed for moderate pain.   losartan (COZAAR) 100 MG tablet  Take 100 mg by mouth daily.   Multiple Vitamin (MULTIVITAMIN WITH MINERALS) TABS tablet Take 1 tablet by mouth daily.   NARCAN 4 MG/0.1ML LIQD nasal spray kit Place 1 spray into the nose as needed (opioid overdose).   nicotine (NICODERM CQ - DOSED IN MG/24 HOURS) 14 mg/24hr patch Place 14 mg onto the skin daily.   oxyCODONE-acetaminophen (PERCOCET) 10-325 MG tablet Take 1 tablet by mouth every 4 (four) hours as needed for pain.   phentermine (ADIPEX-P) 37.5 MG tablet Take 1 tablet (37.5 mg total) by mouth daily before breakfast.   sodium chloride (OCEAN) 0.65 % nasal spray Place 1 spray into the nose as needed for congestion.   traZODone (DESYREL) 100 MG tablet Take 2 tablets (200 mg total) by mouth at bedtime as needed. for sleep   VENTOLIN HFA 108 (90 Base) MCG/ACT inhaler Inhale 2 puffs into the lungs every 4 (four) hours as needed for wheezing or shortness of breath.    vitamin E 180 MG (400 UNITS) capsule Take 400 Units by mouth daily.   No facility-administered encounter medications on file as of 11/13/2021.     Review of Systems  Review of Systems  N/a Physical Exam  BP 120/70 (BP Location: Left Arm, Patient Position: Sitting, Cuff Size: Normal)    Pulse (!) 111    Temp 98.1 F (36.7 C) (Oral)    Ht _0  (1.549 m)    Wt 208 lb (94.3 kg)    SpO2 99%    BMI 39.30 kg/m   Wt Readings from Last 5 Encounters:  11/13/21 208 lb (94.3 kg)  11/12/21 208 lb 3.2 oz (94.4 kg)  11/11/21 211 lb (95.7 kg)  10/21/21 210  lb (95.3 kg)  09/21/21 212 lb (96.2 kg)    BMI Readings from Last 5 Encounters:  11/13/21 39.30 kg/m  11/12/21 39.34 kg/m  11/11/21 39.87 kg/m  10/21/21 39.68 kg/m  09/21/21 40.06 kg/m     Physical Exam General: Well-appearing, no acute distress Eyes: EOMI, no icterus Neck: Supple, no JVP appreciated Respiratory: Distant breath sounds, clear to auscultation bilaterally Cardiovascular: Regular rhythm, no murmurs MSK: No synovitis, no joint effusion Neuro: Normal gait, no weakness Psych: Normal mood, full affect  Assessment & Plan:   Tobacco Abuse: Recently quit smoking cigarettes.  Congratulated.   Dyspnea on exertion: Mild symptoms of DOE. Can not tolerate Respimat spiriva. Will trial Anoro.  Mild improvement in symptoms with this as well as weight loss.  Preoperative evaluation: Pulmonary medicine does not provide operative clearance but rather preoperative restratification. Based on the ARISCAT model and assuming less than 3-hour time of surgery patient is low or 1.6% risk of postoperative pulmonary complication.  There are no modifiable risk factors that can be identified were addressed to decrease this risk.  Please see below for recommendations. --Provide bronchodilators nebulized prior to surgery --Recommend checking blood gas during surgery to evaluate for CO2 retention --If retaining CO2, recommending extubation to BiPAP and postoperative blood gas in recovery to evaluate for CO2 retention --Provide bronchodilators nebulized postoperatively --If able, use regional anesthesia and avoid general anesthesia, avoid neuromuscular blockade as able  Return in about 1 year (around 11/13/2022).   Lanier Clam, MD 11/13/2021

## 2021-11-13 NOTE — Patient Instructions (Addendum)
Nice to see you again  No changes to medications  Let me know if you need Anoro refills  No reason to delay your surgery - we will send a note to your surgeon and their office  Return to clinic in 1 year or sooner as needed

## 2021-11-16 NOTE — Telephone Encounter (Signed)
OV notes and clearance forms have been faxed back to [facility] . Nothing further needed at this time.

## 2021-11-19 ENCOUNTER — Other Ambulatory Visit: Payer: Self-pay

## 2021-11-19 ENCOUNTER — Ambulatory Visit (INDEPENDENT_AMBULATORY_CARE_PROVIDER_SITE_OTHER): Payer: BC Managed Care – PPO | Admitting: Obstetrics and Gynecology

## 2021-11-19 ENCOUNTER — Encounter: Payer: Self-pay | Admitting: Obstetrics and Gynecology

## 2021-11-19 VITALS — Ht 61.0 in | Wt 210.0 lb

## 2021-11-19 DIAGNOSIS — Z6839 Body mass index (BMI) 39.0-39.9, adult: Secondary | ICD-10-CM

## 2021-11-19 DIAGNOSIS — E669 Obesity, unspecified: Secondary | ICD-10-CM | POA: Diagnosis not present

## 2021-11-19 MED ORDER — CYCLOBENZAPRINE HCL 10 MG PO TABS: 10.0000 mg | ORAL_TABLET | Freq: Three times a day (TID) | ORAL | 2 refills | Status: DC | PRN

## 2021-11-19 MED ORDER — TRAZODONE HCL 100 MG PO TABS: 200.0000 mg | ORAL_TABLET | Freq: Every evening | ORAL | 3 refills | Status: DC | PRN

## 2021-11-19 MED ORDER — PHENTERMINE HCL 37.5 MG PO TABS: 37.5000 mg | ORAL_TABLET | Freq: Every day | ORAL | 0 refills | Status: DC

## 2021-11-19 NOTE — Progress Notes (Signed)
I connected with Genoveva Ill Halle on 11/19/21 at 10:55 AM EST by telephone and verified that I am speaking with the correct person using two identifiers.   I discussed the limitations, risks, security and privacy concerns of performing an evaluation and management service by telephone and the availability of in person appointments. I also discussed with the patient that there may be a patient responsible charge related to this service. The patient expressed understanding and agreed to proceed.  The patient was at home I spoke with the patient from my workstation phone The names of people involved in this encounter were: [name] , and [name]  Gynecology Office Visit  Chief Complaint:  Chief Complaint  Patient presents with   Follow-up    Phone visit - weight loss, no concerns.    History of Present Illness: Patientis a 56 y.o. [place] female, who presents for the evaluation of the desire to lose weight. She has gained 2 pounds 1 months. The patient states the following symptoms since starting her weight loss therapy: appetite suppression, energy, and weight loss.  The patient also reports no other ill effects. The patient specifically denies heart palpitations, anxiety, and insomnia.    Review of Systems: 10 point review of systems negative unless otherwise noted in HPI  Past Medical History:  Past Medical History:  Diagnosis Date   Arthritis    COPD (chronic obstructive pulmonary disease) ([place])    denies   COVID-19 10/23/2019   Asymptomatic   Environmental and seasonal allergies    Falls    Fibromyalgia    History of colon polyps 2017   Hypertension    Pneumonia    aspiration PNA   Wound infection    right foot    Past Surgical History:  Past Surgical History:  Procedure Laterality Date   APPLICATION OF WOUND VAC Right 05/20/2020   Procedure: APPLICATION OF WOUND VAC TO RIGHT FOOT;  Surgeon: Erle Crocker, MD;  Location: [facility];  Service:  Orthopedics;  Laterality: Right;   COLONOSCOPY WITH PROPOFOL N/A 09/03/2016   Procedure: COLONOSCOPY WITH PROPOFOL;  Surgeon: [name], MD;  Location: [facility];  Service: Endoscopy;  Laterality: N/A;   FRACTURE SURGERY     left foot    INCISION AND DRAINAGE Right 05/20/2020   Procedure: RIGHT FOOT INCISION AND DRAINAGE;  Surgeon: Erle Crocker, MD;  Location: [facility];  Service: Orthopedics;  Laterality: Right;  LENGTH OF SURGERY: 1 HOUR   KNEE CARTILAGE SURGERY  2015   left   LAPAROSCOPIC SALPINGOOPHERECTOMY     left hand     1 for ganglion cyst and next yr replace "joints" in left hand   PLANTAR FASCIA SURGERY     left foot   SHOULDER INJECTION Right 05/17/2016   Procedure: SHOULDER INJECTION;  Surgeon: Dorna Leitz, MD;  Location: [facility];  Service: Orthopedics;  Laterality: Right;   SHOULDER SURGERY     x 2 on right shoulder   TONSILLECTOMY     TOTAL KNEE ARTHROPLASTY Left 05/17/2016   Procedure: TOTAL KNEE ARTHROPLASTY;  Surgeon: Dorna Leitz, MD;  Location: [facility];  Service: Orthopedics;  Laterality: Left;   TOTAL KNEE ARTHROPLASTY Right 11/23/2019   Procedure: TOTAL KNEE ARTHROPLASTY;  Surgeon: Dorna Leitz, MD;  Location: [facility];  Service: Orthopedics;  Laterality: Right;   TOTAL SHOULDER ARTHROPLASTY Left 05/09/2015   Procedure: TOTAL SHOULDER ARTHROPLASTY;  Surgeon: Dorna Leitz, MD;  Location: [facility];  Service: Orthopedics;  Laterality:  Left;   TUBAL LIGATION     tubes removed stated patient    Gynecologic History: No LMP recorded. Patient is postmenopausal.  Obstetric History: G0P0000  Family History:  Family History  Problem Relation Age of Onset   Crohn's disease Mother    Heart attack Father    Stroke Father    Hypertension Brother    Hypertension Brother     Social History:  Social History   Socioeconomic History   Marital status: Single    Spouse name: Not on file   Number of children: Not on file   Years of education: Not on file   Highest education  level: Not on file  Occupational History   Not on file  Tobacco Use   Smoking status: Former    Packs/day: 1.00    Years: 30.00    Pack years: 30.00    Types: Cigarettes    Quit date: 11/03/2021    Years since quitting: 0.0   Smokeless tobacco: Never  Vaping Use   Vaping Use: Never used  Substance and Sexual Activity   Alcohol use: Yes    Comment: occassional   Drug use: No   Sexual activity: Yes    Birth control/protection: None  Other Topics Concern   Not on file  Social History Narrative   Not on file   Social Determinants of Health   Financial Resource Strain: Not on file  Food Insecurity: Not on file  Transportation Needs: Not on file  Physical Activity: Not on file  Stress: Not on file  Social Connections: Not on file  Intimate Partner Violence: Not on file    Allergies:  Allergies  Allergen Reactions   Augmentin [Amoxicillin-Pot Clavulanate] Anaphylaxis    Did it involve swelling of the face/tongue/throat, SOB, or low BP? Yes Did it involve sudden or severe rash/hives, skin peeling, or any reaction on the inside of your mouth or nose? No Did you need to seek medical attention at a hospital or doctor's office? Yes When did it last happen? 15 years ago      If all above answers are NO, may proceed with cephalosporin use.    Bee Venom Anaphylaxis, Hives and Shortness Of Breath    Treats with benadryl   Diclofenac Sodium Itching and Swelling    Can tolerate Voltaren  Cannot use PENNSAID- topical gel   Other Anaphylaxis, Hives and Shortness Of Breath    Reaction to spider bites (treats with benadryl)    Medications: Prior to Admission medications   Medication Sig Start Date End Date Taking? Authorizing Provider  acetaminophen (TYLENOL) 500 MG tablet Take 1,000 mg by mouth every 8 (eight) hours as needed for moderate pain.    [provider]  albuterol (PROVENTIL) (2.5 MG/3ML) 0.083% nebulizer solution Inhale 3 mLs into the lungs every 4 (four)  hours as needed for shortness of breath. 11/01/20   [provider]  b complex vitamins tablet Take 1 tablet by mouth daily.    [provider]  chlorpheniramine-HYDROcodone (TUSSIONEX) 10-8 MG/5ML SUER Take 5 mLs by mouth every 12 (twelve) hours as needed for cough.    [provider]  Cholecalciferol (DIALYVITE VITAMIN D 5000) 125 MCG (5000 UT) capsule Take 5,000 Units by mouth daily.    [provider]  clindamycin (CLEOCIN) 150 MG capsule Take 600 mg by mouth See admin instructions. 1 hour before dental procedure 08/18/21   [provider]  cyclobenzaprine (FLEXERIL) 10 MG tablet Take 1 tablet (10  mg total) by mouth 3 (three) times daily as needed for muscle spasms. 09/21/21   [name], MD  diclofenac Sodium (VOLTAREN) 1 % GEL Apply 4 g topically 3 (three) times daily as needed (pain). 02/06/21   [provider]  fluticasone (FLONASE) 50 MCG/ACT nasal spray Place 2 sprays into both nostrils daily as needed for allergies.    [provider]  hydrochlorothiazide (HYDRODIURIL) 25 MG tablet Take 25 mg by mouth daily.    [provider]  ibuprofen (ADVIL) 800 MG tablet Take 800 mg by mouth 2 (two) times daily as needed for moderate pain.    [provider]  losartan (COZAAR) 100 MG tablet Take 100 mg by mouth daily.    [provider]  Multiple Vitamin (MULTIVITAMIN WITH MINERALS) TABS tablet Take 1 tablet by mouth daily.    [provider]  NARCAN 4 MG/0.1ML LIQD nasal spray kit Place 1 spray into the nose as needed (opioid overdose). 05/21/20   [provider]  nicotine (NICODERM CQ - DOSED IN MG/24 HOURS) 14 mg/24hr patch Place 14 mg onto the skin daily.    [provider]  oxyCODONE-acetaminophen (PERCOCET) 10-325 MG tablet Take 1 tablet by mouth every 4 (four) hours as needed for pain. 08/10/20   [provider]  phentermine (ADIPEX-P) 37.5 MG tablet Take 1 tablet  (37.5 mg total) by mouth daily before breakfast. 10/21/21   [name], MD  sodium chloride (OCEAN) 0.65 % nasal spray Place 1 spray into the nose as needed for congestion.    [provider]  traZODone (DESYREL) 100 MG tablet Take 2 tablets (200 mg total) by mouth at bedtime as needed. for sleep 07/10/21   [name], MD  VENTOLIN HFA 108 [phone] Base) MCG/ACT inhaler Inhale 2 puffs into the lungs every 4 (four) hours as needed for wheezing or shortness of breath.  03/19/20   [provider]  vitamin E 180 MG (400 UNITS) capsule Take 400 Units by mouth daily.    [provider]    Physical Exam Height 5' 1"  (1.549 m), weight 210 lb (95.3 kg). Wt Readings from Last 3 Encounters:  11/19/21 210 lb (95.3 kg)  11/13/21 208 lb (94.3 kg)  11/12/21 208 lb 3.2 oz (94.4 kg)  Body mass index is 39.68 kg/m.  No physical exam as this was a remote telephone visit to promote social distancing during the current COVID-19 Pandemic  Assessment: 56 y.o. G0P0000 medication follow up weight loss   Plan: Problem List Items Addressed This Visit   None Visit Diagnoses     Class 2 obesity without serious comorbidity with body mass index (BMI) of 39.0 to 39.9 in adult, unspecified obesity type    -  Primary   Relevant Medications   phentermine (ADIPEX-P) 37.5 MG tablet       1) 1500 Calorie ADA Diet  2) Patient education given regarding appropriate lifestyle changes for weight loss including: regular physical activity, healthy coping strategies, caloric restriction and healthy eating patterns.  3) Patient will be started on weight loss medication. The risks and benefits and side effects of medication, such as Adipex (Phenteramine) ,  Tenuate (Diethylproprion), Contrave (buproprion/naltrexone), Qsymia (phentermine/topiramate), and Saxenda (liraglutide) is discussed. The pros and cons of suppressing appetite and boosting metabolism is discussed. Risks of tolerence and  addiction is discussed for selected agents discussed. Use of medicine will ne short term, such as 3-4 months at a time followed by a period of time off  of the medicine to avoid these risks and side effects for Adipex, Qsymia, and Tenuate discussed. Pt to call with any negative side effects and agrees to keep follow up appts.  4) Patient to take medication, with the benefits of appetite suppression and metabolism boost d/w pt, along with the side effects and risk factors of long term use that will be avoided with our use of short bursts of therapy. Rx provided.   - stop 1 week preop and restart 1 week postop -  having hip surgery tomorrow  5) Telephone time 10:5mn  6) Return if symptoms worsen or fail to improve.    AMalachy Mood MD, [facility], [facility] 11/19/2021, 10:49 AM

## 2021-11-20 ENCOUNTER — Ambulatory Visit (HOSPITAL_COMMUNITY): Payer: BC Managed Care – PPO | Admitting: Anesthesiology

## 2021-11-20 ENCOUNTER — Encounter (HOSPITAL_COMMUNITY)
Admission: RE | Disposition: A | Discharge status: Home / Self Care | Payer: Self-pay | Source: Home / Self Care | Attending: Orthopedic Surgery

## 2021-11-20 ENCOUNTER — Observation Stay (HOSPITAL_COMMUNITY)
Admission: RE | Admit: 2021-11-20 | Discharge: 2021-11-21 | Disposition: A | Discharge status: Home / Self Care | Payer: BC Managed Care – PPO | Attending: Orthopedic Surgery | Admitting: Orthopedic Surgery

## 2021-11-20 ENCOUNTER — Ambulatory Visit (HOSPITAL_COMMUNITY): Payer: BC Managed Care – PPO

## 2021-11-20 ENCOUNTER — Encounter (HOSPITAL_COMMUNITY): Payer: Self-pay | Admitting: Orthopedic Surgery

## 2021-11-20 DIAGNOSIS — Z96653 Presence of artificial knee joint, bilateral: Secondary | ICD-10-CM | POA: Insufficient documentation

## 2021-11-20 DIAGNOSIS — Z01811 Encounter for preprocedural respiratory examination: Secondary | ICD-10-CM

## 2021-11-20 DIAGNOSIS — J449 Chronic obstructive pulmonary disease, unspecified: Secondary | ICD-10-CM | POA: Insufficient documentation

## 2021-11-20 DIAGNOSIS — I10 Essential (primary) hypertension: Secondary | ICD-10-CM | POA: Diagnosis not present

## 2021-11-20 DIAGNOSIS — M1612 Unilateral primary osteoarthritis, left hip: Principal | ICD-10-CM | POA: Diagnosis present

## 2021-11-20 DIAGNOSIS — Z8616 Personal history of COVID-19: Secondary | ICD-10-CM | POA: Diagnosis not present

## 2021-11-20 DIAGNOSIS — Z96612 Presence of left artificial shoulder joint: Secondary | ICD-10-CM | POA: Insufficient documentation

## 2021-11-20 DIAGNOSIS — Z87891 Personal history of nicotine dependence: Secondary | ICD-10-CM | POA: Diagnosis not present

## 2021-11-20 DIAGNOSIS — Z419 Encounter for procedure for purposes other than remedying health state, unspecified: Secondary | ICD-10-CM

## 2021-11-20 HISTORY — PX: TOTAL HIP ARTHROPLASTY: SHX124

## 2021-11-20 LAB — TYPE AND SCREEN
ABO/RH(D): O POS
Antibody Screen: NEGATIVE

## 2021-11-20 SURGERY — ARTHROPLASTY, HIP, TOTAL, ANTERIOR APPROACH
Anesthesia: Spinal | Site: Hip | Laterality: Left

## 2021-11-20 MED ORDER — ALBUTEROL SULFATE (2.5 MG/3ML) 0.083% IN NEBU: 3.0000 mL | INHALATION_SOLUTION | RESPIRATORY_TRACT | Status: DC | PRN

## 2021-11-20 MED ORDER — OXYCODONE HCL 5 MG PO TABS
5.0000 mg | ORAL_TABLET | ORAL | Status: DC | PRN
  Administered 2021-11-20 – 2021-11-21 (×3): 10 mg via ORAL
  Filled 2021-11-20 (×3): qty 2

## 2021-11-20 MED ORDER — DIPHENHYDRAMINE HCL 12.5 MG/5ML PO ELIX: 12.5000 mg | ORAL_SOLUTION | ORAL | Status: DC | PRN

## 2021-11-20 MED ORDER — HYDROMORPHONE HCL 1 MG/ML IJ SOLN
0.2500 mg | INTRAMUSCULAR | Status: DC | PRN
  Administered 2021-11-20 (×2): 0.5 mg via INTRAVENOUS

## 2021-11-20 MED ORDER — FENTANYL CITRATE (PF) 250 MCG/5ML IJ SOLN
INTRAMUSCULAR | Status: DC | PRN
  Administered 2021-11-20 (×2): 50 ug via INTRAVENOUS

## 2021-11-20 MED ORDER — ONDANSETRON HCL 4 MG/2ML IJ SOLN: 4.0000 mg | Freq: Four times a day (QID) | INTRAMUSCULAR | Status: DC | PRN

## 2021-11-20 MED ORDER — BUPIVACAINE IN DEXTROSE 0.75-8.25 % IT SOLN
INTRATHECAL | Status: DC | PRN
  Administered 2021-11-20: 1.7 mL via INTRATHECAL

## 2021-11-20 MED ORDER — METHOCARBAMOL 500 MG PO TABS
500.0000 mg | ORAL_TABLET | Freq: Four times a day (QID) | ORAL | Status: DC | PRN
  Administered 2021-11-20 – 2021-11-21 (×2): 500 mg via ORAL
  Filled 2021-11-20 (×2): qty 1

## 2021-11-20 MED ORDER — NALOXONE HCL 4 MG/0.1ML NA LIQD: 1.0000 | NASAL | Status: DC | PRN

## 2021-11-20 MED ORDER — OXYCODONE HCL 5 MG PO TABS
ORAL_TABLET | ORAL | Status: AC
  Filled 2021-11-20: qty 1

## 2021-11-20 MED ORDER — BUPIVACAINE LIPOSOME 1.3 % IJ SUSP
INTRAMUSCULAR | Status: AC
  Filled 2021-11-20: qty 10

## 2021-11-20 MED ORDER — EPHEDRINE SULFATE-NACL 50-0.9 MG/10ML-% IV SOSY
PREFILLED_SYRINGE | INTRAVENOUS | Status: DC | PRN
  Administered 2021-11-20 (×2): 5 mg via INTRAVENOUS

## 2021-11-20 MED ORDER — OXYCODONE-ACETAMINOPHEN 10-325 MG PO TABS: 1.0000 | ORAL_TABLET | ORAL | Status: DC | PRN

## 2021-11-20 MED ORDER — LOSARTAN POTASSIUM 50 MG PO TABS
100.0000 mg | ORAL_TABLET | Freq: Every day | ORAL | Status: DC
  Administered 2021-11-21: 100 mg via ORAL
  Filled 2021-11-20: qty 2

## 2021-11-20 MED ORDER — HYDROMORPHONE HCL 1 MG/ML IJ SOLN: 1.0000 mg | INTRAMUSCULAR | Status: DC | PRN

## 2021-11-20 MED ORDER — BUPIVACAINE LIPOSOME 1.3 % IJ SUSP: 20.0000 mL | Freq: Once | INTRAMUSCULAR | Status: DC

## 2021-11-20 MED ORDER — ALUM & MAG HYDROXIDE-SIMETH 200-200-20 MG/5ML PO SUSP: 30.0000 mL | ORAL | Status: DC | PRN

## 2021-11-20 MED ORDER — ASPIRIN EC 325 MG PO TBEC: 325.0000 mg | DELAYED_RELEASE_TABLET | Freq: Two times a day (BID) | ORAL | 0 refills | Status: DC

## 2021-11-20 MED ORDER — ACETAMINOPHEN 325 MG PO TABS: 325.0000 mg | ORAL_TABLET | Freq: Four times a day (QID) | ORAL | Status: DC | PRN

## 2021-11-20 MED ORDER — HYDROCHLOROTHIAZIDE 25 MG PO TABS
25.0000 mg | ORAL_TABLET | Freq: Every day | ORAL | Status: DC
  Administered 2021-11-21: 25 mg via ORAL
  Filled 2021-11-20: qty 1

## 2021-11-20 MED ORDER — OXYCODONE-ACETAMINOPHEN 5-325 MG PO TABS
1.0000 | ORAL_TABLET | ORAL | Status: DC | PRN
  Administered 2021-11-20 – 2021-11-21 (×4): 2 via ORAL
  Filled 2021-11-20 (×4): qty 2

## 2021-11-20 MED ORDER — LACTATED RINGERS IV SOLN: INTRAVENOUS | Status: DC

## 2021-11-20 MED ORDER — ALBUTEROL SULFATE HFA 108 (90 BASE) MCG/ACT IN AERS: 2.0000 | INHALATION_SPRAY | RESPIRATORY_TRACT | Status: DC | PRN

## 2021-11-20 MED ORDER — FENTANYL CITRATE (PF) 100 MCG/2ML IJ SOLN
INTRAMUSCULAR | Status: AC
  Filled 2021-11-20: qty 2

## 2021-11-20 MED ORDER — HYDROMORPHONE HCL 2 MG PO TABS: 2.0000 mg | ORAL_TABLET | Freq: Four times a day (QID) | ORAL | 0 refills | Status: DC | PRN

## 2021-11-20 MED ORDER — OXYCODONE HCL 5 MG PO TABS
5.0000 mg | ORAL_TABLET | Freq: Once | ORAL | Status: AC | PRN
  Administered 2021-11-20: 5 mg via ORAL

## 2021-11-20 MED ORDER — MENTHOL 3 MG MT LOZG: 1.0000 | LOZENGE | OROMUCOSAL | Status: DC | PRN

## 2021-11-20 MED ORDER — DOCUSATE SODIUM 100 MG PO CAPS: 100.0000 mg | ORAL_CAPSULE | Freq: Two times a day (BID) | ORAL | 0 refills | Status: DC

## 2021-11-20 MED ORDER — DOCUSATE SODIUM 100 MG PO CAPS
100.0000 mg | ORAL_CAPSULE | Freq: Two times a day (BID) | ORAL | Status: DC
  Administered 2021-11-20 – 2021-11-21 (×2): 100 mg via ORAL
  Filled 2021-11-20 (×2): qty 1

## 2021-11-20 MED ORDER — METHOCARBAMOL 500 MG IVPB - SIMPLE MED
500.0000 mg | Freq: Four times a day (QID) | INTRAVENOUS | Status: DC | PRN
  Filled 2021-11-20: qty 50

## 2021-11-20 MED ORDER — PHENYLEPHRINE 40 MCG/ML (10ML) SYRINGE FOR IV PUSH (FOR BLOOD PRESSURE SUPPORT)
PREFILLED_SYRINGE | INTRAVENOUS | Status: DC | PRN
  Administered 2021-11-20 (×5): 80 ug via INTRAVENOUS

## 2021-11-20 MED ORDER — MIDAZOLAM HCL 5 MG/5ML IJ SOLN
INTRAMUSCULAR | Status: DC | PRN
  Administered 2021-11-20: 2 mg via INTRAVENOUS

## 2021-11-20 MED ORDER — CYCLOBENZAPRINE HCL 10 MG PO TABS
10.0000 mg | ORAL_TABLET | Freq: Three times a day (TID) | ORAL | Status: DC | PRN
  Administered 2021-11-20 – 2021-11-21 (×3): 10 mg via ORAL
  Filled 2021-11-20 (×3): qty 1

## 2021-11-20 MED ORDER — BUPIVACAINE LIPOSOME 1.3 % IJ SUSP
INTRAMUSCULAR | Status: DC | PRN
  Administered 2021-11-20: 10 mL

## 2021-11-20 MED ORDER — PHENYLEPHRINE HCL-NACL 20-0.9 MG/250ML-% IV SOLN
INTRAVENOUS | Status: DC | PRN
  Administered 2021-11-20: 50 ug/min via INTRAVENOUS

## 2021-11-20 MED ORDER — METHOCARBAMOL 500 MG IVPB - SIMPLE MED
INTRAVENOUS | Status: AC
  Administered 2021-11-20: 500 mg via INTRAVENOUS
  Filled 2021-11-20: qty 50

## 2021-11-20 MED ORDER — PHENYLEPHRINE HCL (PRESSORS) 10 MG/ML IV SOLN
INTRAVENOUS | Status: AC
  Filled 2021-11-20: qty 1

## 2021-11-20 MED ORDER — ONDANSETRON HCL 4 MG PO TABS
4.0000 mg | ORAL_TABLET | Freq: Four times a day (QID) | ORAL | Status: DC | PRN
  Filled 2021-11-20: qty 1

## 2021-11-20 MED ORDER — SODIUM CHLORIDE 0.9 % IV SOLN: INTRAVENOUS | Status: DC

## 2021-11-20 MED ORDER — MIDAZOLAM HCL 2 MG/2ML IJ SOLN
INTRAMUSCULAR | Status: AC
  Filled 2021-11-20: qty 2

## 2021-11-20 MED ORDER — BUPIVACAINE-EPINEPHRINE 0.25% -1:200000 IJ SOLN
INTRAMUSCULAR | Status: DC | PRN
  Administered 2021-11-20: 30 mL

## 2021-11-20 MED ORDER — WATER FOR IRRIGATION, STERILE IR SOLN
Status: DC | PRN
  Administered 2021-11-20: 1000 mL

## 2021-11-20 MED ORDER — DEXAMETHASONE SODIUM PHOSPHATE 10 MG/ML IJ SOLN
10.0000 mg | Freq: Two times a day (BID) | INTRAMUSCULAR | Status: AC
  Administered 2021-11-20 – 2021-11-21 (×2): 10 mg via INTRAVENOUS
  Filled 2021-11-20 (×2): qty 1

## 2021-11-20 MED ORDER — TRANEXAMIC ACID-NACL 1000-0.7 MG/100ML-% IV SOLN
1000.0000 mg | INTRAVENOUS | Status: AC
  Administered 2021-11-20: 1000 mg via INTRAVENOUS
  Filled 2021-11-20: qty 100

## 2021-11-20 MED ORDER — POVIDONE-IODINE 10 % EX SWAB
2.0000 "application " | Freq: Once | CUTANEOUS | Status: AC
  Administered 2021-11-20: 2 via TOPICAL

## 2021-11-20 MED ORDER — SODIUM CHLORIDE 0.9 % IR SOLN
Status: DC | PRN
  Administered 2021-11-20: 1000 mL

## 2021-11-20 MED ORDER — ASPIRIN EC 325 MG PO TBEC
325.0000 mg | DELAYED_RELEASE_TABLET | Freq: Two times a day (BID) | ORAL | Status: DC
  Administered 2021-11-21: 325 mg via ORAL
  Filled 2021-11-20: qty 1

## 2021-11-20 MED ORDER — PROPOFOL 10 MG/ML IV BOLUS
INTRAVENOUS | Status: DC | PRN
  Administered 2021-11-20 (×2): 10 mg via INTRAVENOUS
  Administered 2021-11-20 (×2): 20 mg via INTRAVENOUS
  Administered 2021-11-20: 30 mg via INTRAVENOUS

## 2021-11-20 MED ORDER — VANCOMYCIN HCL IN DEXTROSE 1-5 GM/200ML-% IV SOLN
1000.0000 mg | INTRAVENOUS | Status: AC
  Administered 2021-11-20: 1000 mg via INTRAVENOUS
  Filled 2021-11-20: qty 200

## 2021-11-20 MED ORDER — OXYCODONE HCL 5 MG/5ML PO SOLN: 5.0000 mg | Freq: Once | ORAL | Status: AC | PRN

## 2021-11-20 MED ORDER — HYDROMORPHONE HCL 1 MG/ML IJ SOLN
INTRAMUSCULAR | Status: AC
  Administered 2021-11-20: 1 mg via INTRAVENOUS
  Filled 2021-11-20: qty 2

## 2021-11-20 MED ORDER — TRAZODONE HCL 100 MG PO TABS
200.0000 mg | ORAL_TABLET | Freq: Every day | ORAL | Status: DC
  Administered 2021-11-20: 200 mg via ORAL
  Filled 2021-11-20: qty 2

## 2021-11-20 MED ORDER — PHENOL 1.4 % MT LIQD: 1.0000 | OROMUCOSAL | Status: DC | PRN

## 2021-11-20 MED ORDER — PROPOFOL 500 MG/50ML IV EMUL
INTRAVENOUS | Status: DC | PRN
  Administered 2021-11-20: 100 ug/kg/min via INTRAVENOUS

## 2021-11-20 MED ORDER — BISACODYL 5 MG PO TBEC: 5.0000 mg | DELAYED_RELEASE_TABLET | Freq: Every day | ORAL | Status: DC | PRN

## 2021-11-20 MED ORDER — POLYETHYLENE GLYCOL 3350 17 G PO PACK: 17.0000 g | PACK | Freq: Every day | ORAL | Status: DC | PRN

## 2021-11-20 MED ORDER — TRANEXAMIC ACID-NACL 1000-0.7 MG/100ML-% IV SOLN
1000.0000 mg | Freq: Once | INTRAVENOUS | Status: AC
  Administered 2021-11-20: 1000 mg via INTRAVENOUS
  Filled 2021-11-20: qty 100

## 2021-11-20 MED ORDER — HYDROMORPHONE HCL 1 MG/ML IJ SOLN: 0.5000 mg | INTRAMUSCULAR | Status: DC | PRN

## 2021-11-20 MED ORDER — VANCOMYCIN HCL IN DEXTROSE 1-5 GM/200ML-% IV SOLN
1000.0000 mg | Freq: Two times a day (BID) | INTRAVENOUS | Status: AC
  Administered 2021-11-21: 1000 mg via INTRAVENOUS
  Filled 2021-11-20: qty 200

## 2021-11-20 SURGICAL SUPPLY — 48 items
APL SKNCLS STERI-STRIP NONHPOA (GAUZE/BANDAGES/DRESSINGS)
BAG COUNTER SPONGE SURGICOUNT (BAG) IMPLANT
BAG SPEC THK2 15X12 ZIP CLS (MISCELLANEOUS)
BAG ZIPLOCK 12X15 (MISCELLANEOUS) IMPLANT
BALL HIP CERAMIC (Hips) IMPLANT
BENZOIN TINCTURE PRP APPL 2/3 (GAUZE/BANDAGES/DRESSINGS) IMPLANT
BLADE SAW SGTL 18X1.27X75 (BLADE) ×2 IMPLANT
BLADE SURG SZ10 CARB STEEL (BLADE) ×2 IMPLANT
COVER PERINEAL POST (MISCELLANEOUS) ×2 IMPLANT
COVER SURGICAL LIGHT HANDLE (MISCELLANEOUS) ×2 IMPLANT
CUP GRIPTON 48MM 100 HIP (Hips) ×1 IMPLANT
DECANTER SPIKE VIAL GLASS SM (MISCELLANEOUS) ×2 IMPLANT
DRAPE FOOT SWITCH (DRAPES) ×2 IMPLANT
DRAPE STERI IOBAN 125X83 (DRAPES) ×2 IMPLANT
DRAPE U-SHAPE 47X51 STRL (DRAPES) ×4 IMPLANT
DRSG AQUACEL AG ADV 3.5X10 (GAUZE/BANDAGES/DRESSINGS) ×1 IMPLANT
DURAPREP 26ML APPLICATOR (WOUND CARE) ×2 IMPLANT
ELECT BLADE TIP CTD 4 INCH (ELECTRODE) ×2 IMPLANT
ELECT REM PT RETURN 15FT ADLT (MISCELLANEOUS) ×2 IMPLANT
ELIMINATOR HOLE APEX DEPUY (Hips) ×1 IMPLANT
GAUZE XEROFORM 1X8 LF (GAUZE/BANDAGES/DRESSINGS) IMPLANT
GLOVE SRG 8 PF TXTR STRL LF DI (GLOVE) ×2 IMPLANT
GLOVE SURG NEOP MICRO LF SZ7.5 (GLOVE) ×4 IMPLANT
GLOVE SURG UNDER POLY LF SZ8 (GLOVE) ×4
GOWN STRL REUS W/TWL XL LVL3 (GOWN DISPOSABLE) ×4 IMPLANT
HIP BALL CERAMIC (Hips) ×2 IMPLANT
HOLDER FOLEY CATH W/STRAP (MISCELLANEOUS) ×2 IMPLANT
HOOD PEEL AWAY FLYTE STAYCOOL (MISCELLANEOUS) ×4 IMPLANT
KIT TURNOVER KIT A (KITS) IMPLANT
LINER ACET 32X48 (Liner) ×1 IMPLANT
NDL SAFETY ECLIPSE 18X1.5 (NEEDLE) IMPLANT
NEEDLE HYPO 18GX1.5 SHARP (NEEDLE) ×2
NEEDLE HYPO 22GX1.5 SAFETY (NEEDLE) ×2 IMPLANT
PACK ANTERIOR HIP CUSTOM (KITS) ×2 IMPLANT
PENCIL SMOKE EVACUATOR (MISCELLANEOUS) IMPLANT
SPONGE T-LAP 18X18 ~~LOC~~+RFID (SPONGE) ×6 IMPLANT
STAPLER VISISTAT 35W (STAPLE) IMPLANT
STEM CORAIL KA12 (Stem) ×1 IMPLANT
STRIP CLOSURE SKIN 1/2X4 (GAUZE/BANDAGES/DRESSINGS) IMPLANT
SUT ETHIBOND NAB CT1 #1 30IN (SUTURE) ×4 IMPLANT
SUT MNCRL AB 3-0 PS2 18 (SUTURE) ×1 IMPLANT
SUT VIC AB 0 CT1 36 (SUTURE) ×2 IMPLANT
SUT VIC AB 1 CT1 36 (SUTURE) ×2 IMPLANT
SUT VIC AB 2-0 CT1 27 (SUTURE) ×2
SUT VIC AB 2-0 CT1 TAPERPNT 27 (SUTURE) ×1 IMPLANT
SYR 10ML LL (SYRINGE) ×1 IMPLANT
TRAY FOLEY MTR SLVR 14FR STAT (SET/KITS/TRAYS/PACK) ×1 IMPLANT
TUBE SUCTION HIGH CAP CLEAR NV (SUCTIONS) ×1 IMPLANT

## 2021-11-20 NOTE — Anesthesia Preprocedure Evaluation (Addendum)
Anesthesia Evaluation  Patient identified by MRN, date of birth, ID band Patient awake    Reviewed: Allergy & Precautions, NPO status , Patient's Chart, lab work & pertinent test results  History of Anesthesia Complications Negative for: history of anesthetic complications  Airway Mallampati: II  TM Distance: >3 FB Neck ROM: Full    Dental  (+) Teeth Intact, Dental Advisory Given   Pulmonary COPD, Current Smoker and Patient abstained from smoking., former smoker,    Pulmonary exam normal        Cardiovascular hypertension, Pt. on medications Normal cardiovascular exam     Neuro/Psych negative neurological ROS  negative psych ROS   GI/Hepatic negative GI ROS, Neg liver ROS,   Endo/Other  Morbid obesity  Renal/GU negative Renal ROS     Musculoskeletal  (+) Arthritis , Fibromyalgia -  Abdominal   Peds  Hematology negative hematology ROS (+)   Anesthesia Other Findings Day of surgery medications reviewed with the patient.  Reproductive/Obstetrics                            Anesthesia Physical  Anesthesia Plan  ASA: 3  Anesthesia Plan: Spinal   Post-op Pain Management:    Induction:   PONV Risk Score and Plan: 2 and Propofol infusion, Treatment may vary due to age or medical condition, Ondansetron and Midazolam  Airway Management Planned: Nasal Cannula and Natural Airway  Additional Equipment:   Intra-op Plan:   Post-operative Plan:   Informed Consent: I have reviewed the patients History and Physical, chart, labs and discussed the procedure including the risks, benefits and alternatives for the proposed anesthesia with the patient or authorized representative who has indicated his/her understanding and acceptance.     Dental advisory given  Plan Discussed with: Anesthesiologist, CRNA and Surgeon  Anesthesia Plan Comments:        Anesthesia Quick Evaluation

## 2021-11-20 NOTE — Anesthesia Postprocedure Evaluation (Signed)
Anesthesia Post Note  Patient: [name]  Procedure(s) Performed: TOTAL HIP ARTHROPLASTY ANTERIOR APPROACH (Left: Hip)     Patient location during evaluation: PACU Anesthesia Type: Spinal Level of consciousness: oriented and awake and alert Pain management: pain level controlled Vital Signs Assessment: post-procedure vital signs reviewed and stable Respiratory status: spontaneous breathing, respiratory function stable and nonlabored ventilation Cardiovascular status: blood pressure returned to baseline and stable Postop Assessment: no headache, no backache, no apparent nausea or vomiting, spinal receding and patient able to bend at knees Anesthetic complications: no   No notable events documented.  Last Vitals:  Vitals:   11/20/21 1545 11/20/21 1600  BP: 103/79 122/80  Pulse: (!) 101 95  Resp: 19 16  Temp: 36.6 C   SpO2: 95% 99%    Last Pain:  Vitals:   11/20/21 1600  TempSrc:   PainSc: 10-Worst pain ever                 Allicia Culley A.

## 2021-11-20 NOTE — Op Note (Signed)
PATIENT ID:      [name]  MRN:     [id number] DOB/AGE:    57/14/1967 / 57 y.o.       OPERATIVE REPORT    DATE OF PROCEDURE:  11/20/2021       PREOPERATIVE DIAGNOSIS:  LEFT TOTAL HIP ARTHROPLASTY                                                       Estimated body mass index is 39.68 kg/m as calculated from the following:   Height as of 11/19/21: 5\' 1"  (1.549 m).   Weight as of this encounter: 95.3 kg.     POSTOPERATIVE DIAGNOSIS:  LEFT TOTAL HIP ARTHROPLASTY                                                           PROCEDURE:  1. left total hip arthroplasty using a 48 mm DePuy Pinnacle gription Cup, [employer],  neutral liner, a +5 54mm ceramic head,  and a #13  Corail stem, 2.interpretation of multiple intraoperative fluoroscopic images   SURGEON: Alta Corning    ASSISTANT:   Nehemiah Massed PA-C  (present throughout entire procedure and necessary for timely completion of the procedure)  ANESTHESIA: spinal  BLOOD LOSS: min Tranexamic Acid: 1 gram IV DRAINS: None COMPLICATIONS: None    NDICATIONS FOR PROCEDURE:Patient with end-stage arthritis of the right hip.  X-rays show bone-on-bone arthritic changes. Despite conservative measures with observation, anti-inflammatory medicine, narcotics, use of a cane, has severe unremitting pain and can ambulate only less than 1 block before resting.  Patient desires elective right total hip arthroplasty to decrease pain and increase function. The risks, benefits, and alternatives were discussed at length including but not limited to the risks of infection, bleeding, nerve injury, stiffness, blood clots, the need for revision surgery, cardiopulmonary complications, among others, and they were willing to proceed.Benefits have been discussed. Questions answered.     PROCEDURE IN DETAIL: The patient was identified by armband,  received preoperative IV antibiotics in the holding area at [facility], taken to the operating  room , appropriate anesthetic monitors  were attached and spinal anesthesia was induced.  The patient was placed onto the hot bed and all bony prominences were well-padded.The right hip was prepped and draped for an anterior approach to the hip.  An incision was made and the subcutaneous dissection was down to the level of the tensor fascia.  The fascia was opened and finger dissected.  The bleeders coming across the anterior portion of the hip were identified and cauterized. Retractors were put in place above and below the femoral neck.  The capsule was opened and tagged and a provisional neck cut was made.  The head was removed and sized on the back table.  The acetabulum was sequentially reamed to a level of 47 mm and a 48 mm gription pinnacle cup was hammered into place with 45 of lateral opening and 30 of anteversion.fluoroscopy was used to ensure this position of the cup.  Attention was turned towards the femur where the leg was actually rotated, extended, and  adduction did.  The femur was sequentially broached until a size of 13 broach gave a perfect fit and fill.at this point a +5 32 mm delta ceramic hip ball was placed and the hip reduced.  Fluoroscopic images were taken to assess the leg length, fit and fill of the stem, and cup position.  We were happy with the construct at this point.  The 13broach was removed and a final Corrail stem with standard offset  and a +% 32 mm ceramic hip ball was placed and reduced.  Final images were taken to make certain there were happy with the position at this point.   The capsule was closed with #1 Vicryl suture.  The tensor fascia was closed with 0 Vicryl suture.  The skin was then closed with combination of 0 and 2-0 Vicryl suture.  The top layer was with 3-0 Monocryl suture.  Benzoin and Steri-Strips were applied  and a sterile compressive dressing was applied and the patient taken to recovery room she noted be in satisfactory condition.  Past medical Motion  for the procedure was approximately 300 cc.  Of note Nehemiah Massed was with the entire case and assisted by retraction of tissues, manipulation of the leg, and closing the minimize or time.    Alta Corning 05/03/2021, 6:15 PM  Alta Corning 11/20/2021, 3:47 PM

## 2021-11-20 NOTE — Discharge Instructions (Signed)

## 2021-11-20 NOTE — Anesthesia Procedure Notes (Signed)
Spinal  Patient location during procedure: OR Start time: 11/20/2021 1:30 PM End time: 11/20/2021 1:40 PM Reason for block: surgical anesthesia Staffing Performed: anesthesiologist  Anesthesiologist: Duane Boston, MD Preanesthetic Checklist Completed: patient identified, IV checked, risks and benefits discussed, surgical consent, monitors and equipment checked, pre-op evaluation and timeout performed Spinal Block Patient position: sitting Prep: DuraPrep Patient monitoring: cardiac monitor, continuous pulse ox and blood pressure Approach: midline Location: L2-3 Injection technique: single-shot Needle Needle type: Pencan  Needle gauge: 24 G Needle length: 9 cm Assessment Events: CSF return Additional Notes Functioning IV was confirmed and monitors were applied. Sterile prep and drape, including hand hygiene and sterile gloves were used. The patient was positioned and the spine was prepped. The skin was anesthetized with lidocaine.  Free flow of clear CSF was obtained prior to injecting local anesthetic into the CSF.  The spinal needle aspirated freely following injection.  The needle was carefully withdrawn.  The patient tolerated the procedure well.

## 2021-11-20 NOTE — H&P (Signed)
TOTAL HIP ADMISSION H&P  Patient is admitted for left total hip arthroplasty.  Subjective:  Chief Complaint: left hip pain  HPI: [name], 56 y.o. female, has a history of pain and functional disability in the left hip(s) due to arthritis and patient has failed non-surgical conservative treatments for greater than 12 weeks to include NSAID's and/or analgesics, corticosteriod injections, weight reduction as appropriate, and activity modification.  Onset of symptoms was gradual starting 4 years ago with gradually worsening course since that time.The patient noted no past surgery on the right hip(s).  Patient currently rates pain in the right hip at 10 out of 10 with activity. Patient has night pain, worsening of pain with activity and weight bearing, trendelenberg gait, and pain with passive range of motion. Patient has evidence of subchondral cysts, subchondral sclerosis, periarticular osteophytes, joint subluxation, and joint space narrowing by imaging studies. This condition presents safety issues increasing the risk of falls. This patient has had  failure of all reasonable conservative care .  There is no current active infection.  Patient Active Problem List   Diagnosis Date Noted   Primary osteoarthritis of right knee 11/23/2019   Benign neoplasm of cecum    Special screening for malignant neoplasms, colon    Primary osteoarthritis of left knee 05/17/2016   Tendinitis of right rotator cuff 05/17/2016   Osteoarthritis of left shoulder 05/09/2015   TOBACCO ABUSE 06/20/2008   CONSTIPATION 06/18/2008   HEART MURMUR, BENIGN 06/18/2008   OTHER RESPIRATORY COMPLICATIONS 32/20/2542   Past Medical History:  Diagnosis Date   Arthritis    COPD (chronic obstructive pulmonary disease) ([place])    denies   COVID-19 10/23/2019   Asymptomatic   Environmental and seasonal allergies    Falls    Fibromyalgia    History of colon polyps 2017   Hypertension    Pneumonia    aspiration PNA    Wound infection    right foot    Past Surgical History:  Procedure Laterality Date   APPLICATION OF WOUND VAC Right 05/20/2020   Procedure: APPLICATION OF WOUND VAC TO RIGHT FOOT;  Surgeon: Erle Crocker, MD;  Location: [place];  Service: Orthopedics;  Laterality: Right;   COLONOSCOPY WITH PROPOFOL N/A 09/03/2016   Procedure: COLONOSCOPY WITH PROPOFOL;  Surgeon: [name], MD;  Location: [facility];  Service: Endoscopy;  Laterality: N/A;   FRACTURE SURGERY     left foot    INCISION AND DRAINAGE Right 05/20/2020   Procedure: RIGHT FOOT INCISION AND DRAINAGE;  Surgeon: Erle Crocker, MD;  Location: [place];  Service: Orthopedics;  Laterality: Right;  LENGTH OF SURGERY: 1 HOUR   KNEE CARTILAGE SURGERY  2015   left   LAPAROSCOPIC SALPINGOOPHERECTOMY     left hand     1 for ganglion cyst and next yr replace "joints" in left hand   PLANTAR FASCIA SURGERY     left foot   SHOULDER INJECTION Right 05/17/2016   Procedure: SHOULDER INJECTION;  Surgeon: Dorna Leitz, MD;  Location: [place];  Service: Orthopedics;  Laterality: Right;   SHOULDER SURGERY     x 2 on right shoulder   TONSILLECTOMY     TOTAL KNEE ARTHROPLASTY Left 05/17/2016   Procedure: TOTAL KNEE ARTHROPLASTY;  Surgeon: Dorna Leitz, MD;  Location: [place];  Service: Orthopedics;  Laterality: Left;   TOTAL KNEE ARTHROPLASTY Right 11/23/2019   Procedure: TOTAL KNEE ARTHROPLASTY;  Surgeon: Dorna Leitz, MD;  Location: [facility];  Service: Orthopedics;  Laterality:  Right;   TOTAL SHOULDER ARTHROPLASTY Left 05/09/2015   Procedure: TOTAL SHOULDER ARTHROPLASTY;  Surgeon: Dorna Leitz, MD;  Location: [place];  Service: Orthopedics;  Laterality: Left;   TUBAL LIGATION     tubes removed stated patient    No current facility-administered medications for this encounter.   Allergies  Allergen Reactions   Augmentin [Amoxicillin-Pot Clavulanate] Anaphylaxis    Did it involve swelling of the face/tongue/throat, SOB, or low BP? Yes Did it  involve sudden or severe rash/hives, skin peeling, or any reaction on the inside of your mouth or nose? No Did you need to seek medical attention at a hospital or doctor's office? Yes When did it last happen? 15 years ago      If all above answers are NO, may proceed with cephalosporin use.    Bee Venom Anaphylaxis, Hives and Shortness Of Breath    Treats with benadryl   Diclofenac Sodium Itching and Swelling    Can tolerate Voltaren  Cannot use PENNSAID- topical gel   Other Anaphylaxis, Hives and Shortness Of Breath    Reaction to spider bites (treats with benadryl)    Social History   Tobacco Use   Smoking status: Former    Packs/day: 1.00    Years: 30.00    Pack years: 30.00    Types: Cigarettes    Quit date: 11/03/2021    Years since quitting: 0.0   Smokeless tobacco: Never  Substance Use Topics   Alcohol use: Yes    Comment: occassional    Family History  Problem Relation Age of Onset   Crohn's disease Mother    Heart attack Father    Stroke Father    Hypertension Brother    Hypertension Brother      Review of Systems ROS: I have reviewed the patient's review of systems thoroughly and there are no positive responses as relates to the HPI.  Objective:  Physical Exam  Vital signs in last 24 hours: Weight:  [95.3 kg] 95.3 kg (01/12 1042) Well-developed well-nourished patient in no acute distress. Alert and oriented x3 HEENT:within normal limits Cardiac: Regular rate and rhythm Pulmonary: Lungs clear to auscultation Abdomen: Soft and nontender.  Normal active bowel sounds  Musculoskeletal: (Left hip: Painful range of motion.  Limited range of motion.  Pain with internal rotation.  Neurovascular intact distally. Labs: Recent Results (from the past 2160 hour(s))  Surgical pcr screen     Status: Abnormal   Collection Time: 11/12/21  1:32 PM   Specimen: Nasal Mucosa; Nasal Swab  Result Value Ref Range   MRSA, PCR POSITIVE (A) NEGATIVE    Comment: RESULT  CALLED TO, READ BACK BY AND VERIFIED WITH: HESTER,T. RN AT 1610 11/12/21 MULLINS,T    Staphylococcus aureus POSITIVE (A) NEGATIVE    Comment: RESULT CALLED TO, READ BACK BY AND VERIFIED WITH: HESTER,T. RN AT 2542 11/12/21 MULLINS,T (NOTE) The Xpert SA Assay (FDA approved for NASAL specimens in patients 48 years of age and older), is one component of a comprehensive surveillance program. It is not intended to diagnose infection nor to guide or monitor treatment. Performed at [facility], [place] [address] [place], [place] [address]   Basic metabolic panel per protocol     Status: Abnormal   Collection Time: 11/12/21  1:32 PM  Result Value Ref Range   Sodium 137 135 - 145 mmol/L   Potassium 4.2 3.5 - 5.1 mmol/L   Chloride 103 98 - 111 mmol/L   CO2 29 22 -  32 mmol/L   Glucose, Bld 126 (H) 70 - 99 mg/dL    Comment: Glucose reference range applies only to samples taken after fasting for at least 8 hours.   BUN 18 6 - 20 mg/dL   Creatinine, Ser 1.13 (H) 0.44 - 1.00 mg/dL   Calcium 9.1 8.9 - 10.3 mg/dL   GFR, Estimated 57 (L) >60 mL/min    Comment: (NOTE) Calculated using the CKD-EPI Creatinine Equation (2021)    Anion gap 5 5 - 15    Comment: Performed at [facility], [place] [address] [place], [place] [address]  CBC per protocol     Status: None   Collection Time: 11/12/21  1:32 PM  Result Value Ref Range   WBC 9.9 4.0 - 10.5 K/uL   RBC 4.14 3.87 - 5.11 MIL/uL   Hemoglobin 13.0 12.0 - 15.0 g/dL   HCT 39.5 36.0 - 46.0 %   MCV 95.4 80.0 - 100.0 fL   MCH 31.4 26.0 - 34.0 pg   MCHC 32.9 30.0 - 36.0 g/dL   RDW 12.0 11.5 - 15.5 %   Platelets 361 150 - 400 K/uL   nRBC 0.0 0.0 - 0.2 %    Comment: Performed at [facility], [place] [address] [place], [place] [address]  Type and screen [place]     Status: None   Collection Time: 11/12/21  1:32 PM  Result Value Ref Range   ABO/RH(D) O POS    Antibody Screen  NEG    Sample Expiration 11/23/2021,2359    Extend sample reason      NO TRANSFUSIONS OR PREGNANCY IN THE PAST 3 MONTHS Performed at [facility], [place] [address], [place], [place] [address]      Estimated body mass index is 39.68 kg/m as calculated from the following:   Height as of 11/19/21: 5\' 1"  (1.549 m).   Weight as of 11/19/21: 95.3 kg.   Imaging Review Plain radiographs demonstrate severe degenerative joint disease of the left hip(s). The bone quality appears to be fair for age and reported activity level.      Assessment/Plan:  End stage arthritis, left hip(s)  The patient history, physical examination, clinical judgement of the provider and imaging studies are consistent with end stage degenerative joint disease of the left hip(s) and total hip arthroplasty is deemed medically necessary. The treatment options including medical management, injection therapy, arthroscopy and arthroplasty were discussed at length. The risks and benefits of total hip arthroplasty were presented and reviewed. The risks due to aseptic loosening, infection, stiffness, dislocation/subluxation,  thromboembolic complications and other imponderables were discussed.  The patient acknowledged the explanation, agreed to proceed with the plan and consent was signed. Patient is being admitted for inpatient treatment for surgery, pain control, PT, OT, prophylactic antibiotics, VTE prophylaxis, progressive ambulation and ADL's and discharge planning.The patient is planning to be discharged home with home health services

## 2021-11-20 NOTE — Transfer of Care (Signed)
Immediate Anesthesia Transfer of Care Note  Patient: [name]  Procedure(s) Performed: TOTAL HIP ARTHROPLASTY ANTERIOR APPROACH (Left: Hip)  Patient Location: PACU  Anesthesia Type:Spinal  Level of Consciousness: awake, alert , oriented and patient cooperative  Airway & Oxygen Therapy: Patient Spontanous Breathing and Patient connected to face mask oxygen  Post-op Assessment: Report given to RN and Post -op Vital signs reviewed and stable  Post vital signs: Reviewed and stable  Last Vitals:  Vitals Value Taken Time  BP 106/71 1532  Temp    Pulse 98   Resp    SpO2 99%     Last Pain:  Vitals:   11/20/21 1139  TempSrc: Oral  PainSc:          Complications: No notable events documented.

## 2021-11-21 ENCOUNTER — Other Ambulatory Visit: Payer: Self-pay

## 2021-11-21 DIAGNOSIS — M1612 Unilateral primary osteoarthritis, left hip: Secondary | ICD-10-CM | POA: Diagnosis not present

## 2021-11-21 LAB — CBC
HCT: 35.7 % — ABNORMAL LOW (ref 36.0–46.0)
Hemoglobin: 11.6 g/dL — ABNORMAL LOW (ref 12.0–15.0)
MCH: 31.4 pg (ref 26.0–34.0)
MCHC: 32.5 g/dL (ref 30.0–36.0)
MCV: 96.5 fL (ref 80.0–100.0)
Platelets: 348 10*3/uL (ref 150–400)
RBC: 3.7 MIL/uL — ABNORMAL LOW (ref 3.87–5.11)
RDW: 12.4 % (ref 11.5–15.5)
WBC: 11.6 10*3/uL — ABNORMAL HIGH (ref 4.0–10.5)
nRBC: 0 % (ref 0.0–0.2)

## 2021-11-21 NOTE — Progress Notes (Signed)
Subjective: 1 Day Post-Op Procedure(s) (LRB): TOTAL HIP ARTHROPLASTY ANTERIOR APPROACH (Left) Patient reports pain as moderate.  Sitting up in chair eating breakfast.  Voiding okay.  Wants to go home today.  Objective: Vital signs in last 24 hours: Temp:  [97.8 F (36.6 C)-98.7 F (37.1 C)] 98.7 F (37.1 C) (01/14 0507) Pulse Rate:  [95-119] 109 (01/14 0507) Resp:  [11-23] 15 (01/14 0507) BP: (103-161)/(79-100) 133/87 (01/14 0507) SpO2:  [93 %-100 %] 96 % (01/14 0507) Weight:  [95.2 kg-95.3 kg] 95.2 kg (01/14 0056)  Intake/Output from previous day: 01/13 0701 - 01/14 0700 In: 4314.4 [P.O.:1140; I.V.:2624.4; IV Piggyback:550] Out: 2706 [Urine:3500; Blood:250] Intake/Output this shift: No intake/output data recorded.  Recent Labs    11/21/21 0311  HGB 11.6*   Recent Labs    11/21/21 0311  WBC 11.6*  RBC 3.70*  HCT 35.7*  PLT 348   No results for input(s): NA, K, CL, CO2, BUN, CREATININE, GLUCOSE, CALCIUM in the last 72 hours. No results for input(s): LABPT, INR in the last 72 hours. Left hip exam: Neurovascular intact Sensation intact distally Dorsiflexion/Plantar flexion intact Incision: dressing C/D/I No cellulitis present   Assessment/Plan: 1 Day Post-Op Procedure(s) (LRB): TOTAL HIP ARTHROPLASTY ANTERIOR APPROACH (Left) Plan: Up with therapy.  Weight-bear as tolerated on left.  No hip precautions. Aspirin 325 mg twice daily for DVT prophylaxis x1 month postop. Follow-up with Dr. Berenice Primas in 2 weeks.   Patient's anticipated LOS is less than 2 midnights, meeting these requirements: - Younger than 9 - Lives within 1 hour of care - Has a competent adult at home to recover with post-op recover - NO hx  - Diabetes  - Coronary Artery Disease  - Heart failure  - Heart attack  - Stroke  - DVT/VTE  - Cardiac arrhythmia  - Respiratory Failure/COPD  - Renal failure  - Anemia  - Advanced Liver disease     Erlene Senters 11/21/2021, 8:43 AM

## 2021-11-21 NOTE — Discharge Summary (Signed)
Patient ID: [name] MRN: [id number] DOB/AGE: 57-May-1967 57 y.o.  Admit date: 11/20/2021 Discharge date: 11/21/2021  Admission Diagnoses:  Principal Problem:   Primary osteoarthritis of left hip   Discharge Diagnoses:  Same  Past Medical History:  Diagnosis Date   Arthritis    COPD (chronic obstructive pulmonary disease) ([place])    denies   COVID-19 10/23/2019   Asymptomatic   Environmental and seasonal allergies    Falls    Fibromyalgia    History of colon polyps 2017   Hypertension    Pneumonia    aspiration PNA   Wound infection    right foot    Surgeries: Procedure(s): Left TOTAL HIP ARTHROPLASTY ANTERIOR APPROACH on 11/20/2021   Consultants:   Discharged Condition: Improved  Hospital Course: [name] is an 56 y.o. female who was admitted 11/20/2021 for operative treatment ofPrimary osteoarthritis of left hip. Patient has severe unremitting pain that affects sleep, daily activities, and work/hobbies. After pre-op clearance the patient was taken to the operating room on 11/20/2021 and underwent  Procedure(s): Left [place].    Patient was given perioperative antibiotics:  Anti-infectives (From admission, onward)    Start     Dose/Rate Route Frequency Ordered Stop   11/21/21 0200  vancomycin (VANCOCIN) IVPB 1000 mg/200 mL premix        1,000 mg 200 mL/hr over 60 Minutes Intravenous Every 12 hours 11/20/21 1537 11/21/21 0317   11/20/21 1115  vancomycin (VANCOCIN) IVPB 1000 mg/200 mL premix        1,000 mg 200 mL/hr over 60 Minutes Intravenous On call to O.R. 11/20/21 1102 11/20/21 1430        Patient was given sequential compression devices, early ambulation, and chemoprophylaxis to prevent DVT.  Patient benefited maximally from hospital stay and there were no complications.    Recent vital signs: Patient Vitals for the past 24 hrs:  BP Temp Temp src Pulse Resp SpO2 Height Weight  11/21/21 0507 133/87 98.7 F  (37.1 C) Oral (!) 109 15 96 % -- --  11/21/21 0056 123/90 98.7 F (37.1 C) Oral (!) 111 16 93 % 5' 1"  (1.549 m) 95.2 kg  11/20/21 2112 (!) 147/99 98.5 F (36.9 C) Oral (!) 119 16 98 % -- --  11/20/21 1852 (!) 157/100 98.5 F (36.9 C) Oral (!) 112 -- 100 % -- --  11/20/21 1830 (!) 159/98 98 F (36.7 C) -- -- 13 97 % -- --  11/20/21 1815 (!) 159/99 -- -- (!) 113 (!) 23 99 % -- --  11/20/21 1800 (!) 161/93 98 F (36.7 C) -- (!) 116 15 96 % -- --  11/20/21 1745 (!) 156/92 -- -- -- 16 96 % -- --  11/20/21 1730 (!) 150/93 -- -- (!) 104 17 98 % -- --  11/20/21 1715 (!) 131/98 98 F (36.7 C) -- (!) 104 19 97 % -- --  11/20/21 1700 134/88 98.1 F (36.7 C) -- 99 14 96 % -- --  11/20/21 1645 133/89 -- -- 95 11 99 % -- --  11/20/21 1630 119/82 98 F (36.7 C) -- 98 16 99 % -- --  11/20/21 1615 125/88 -- -- 98 18 100 % -- --  11/20/21 1600 122/80 -- -- 95 16 99 % -- --  11/20/21 1545 103/79 97.8 F (36.6 C) -- (!) 101 19 95 % -- --  11/20/21 1139 (!) 121/91 97.8 F (36.6 C) Oral (!) 102 18 98 % -- --  11/20/21 1131 -- -- -- -- -- -- -- 95.3 kg     Recent laboratory studies:  Recent Labs    11/21/21 0311  WBC 11.6*  HGB 11.6*  HCT 35.7*  PLT 348     Discharge Medications:   Allergies as of 11/21/2021       Reactions   Augmentin [amoxicillin-pot Clavulanate] Anaphylaxis   Did it involve swelling of the face/tongue/throat, SOB, or low BP? Yes Did it involve sudden or severe rash/hives, skin peeling, or any reaction on the inside of your mouth or nose? No Did you need to seek medical attention at a hospital or doctor's office? Yes When did it last happen? 15 years ago      If all above answers are NO, may proceed with cephalosporin use.   Bee Venom Anaphylaxis, Hives, Shortness Of Breath   Treats with benadryl   Diclofenac Sodium Itching, Swelling   Can tolerate Voltaren  Cannot use PENNSAID- topical gel   Other Anaphylaxis, Hives, Shortness Of Breath   Reaction to spider  bites (treats with benadryl)        Medication List     STOP taking these medications    clindamycin 150 MG capsule Commonly known as: CLEOCIN   ibuprofen 800 MG tablet Commonly known as: ADVIL   nicotine 14 mg/24hr patch Commonly known as: NICODERM CQ - dosed in mg/24 hours       TAKE these medications    acetaminophen 500 MG tablet Commonly known as: TYLENOL Take 1,000 mg by mouth every 8 (eight) hours as needed for moderate pain.   aspirin EC 325 MG tablet Take 1 tablet (325 mg total) by mouth 2 (two) times daily after a meal. Take x 1 month post op to decrease risk of blood clots.   b complex vitamins tablet Take 1 tablet by mouth daily.   chlorpheniramine-HYDROcodone 10-8 MG/5ML Suer Commonly known as: TUSSIONEX Take 5 mLs by mouth every 12 (twelve) hours as needed for cough.   cyclobenzaprine 10 MG tablet Commonly known as: FLEXERIL Take 1 tablet (10 mg total) by mouth 3 (three) times daily as needed for muscle spasms.   Dialyvite Vitamin D 5000 125 MCG (5000 UT) capsule Generic drug: Cholecalciferol Take 5,000 Units by mouth daily.   diclofenac Sodium 1 % Gel Commonly known as: VOLTAREN Apply 4 g topically 3 (three) times daily as needed (pain).   docusate sodium 100 MG capsule Commonly known as: Colace Take 1 capsule (100 mg total) by mouth 2 (two) times daily.   fluticasone 50 MCG/ACT nasal spray Commonly known as: FLONASE Place 2 sprays into both nostrils daily as needed for allergies.   hydrochlorothiazide 25 MG tablet Commonly known as: HYDRODIURIL Take 25 mg by mouth daily.   HYDROmorphone 2 MG tablet Commonly known as: Dilaudid Take 1-2 tablets (2-4 mg total) by mouth every 6 (six) hours as needed for severe pain.   HYDROmorphone 2 MG tablet Commonly known as: Dilaudid Take 1-2 tablets (2-4 mg total) by mouth every 6 (six) hours as needed for severe pain.   losartan 100 MG tablet Commonly known as: COZAAR Take 100 mg by mouth  daily.   multivitamin with minerals Tabs tablet Take 1 tablet by mouth daily.   Narcan 4 MG/0.1ML Liqd nasal spray kit Generic drug: naloxone Place 1 spray into the nose as needed (opioid overdose).   oxyCODONE-acetaminophen 10-325 MG tablet Commonly known as: PERCOCET Take 1 tablet by mouth every 4 (four) hours as needed for  pain.   phentermine 37.5 MG tablet Commonly known as: ADIPEX-P Take 1 tablet (37.5 mg total) by mouth daily before breakfast.   sodium chloride 0.65 % nasal spray Commonly known as: OCEAN Place 1 spray into the nose as needed for congestion.   traZODone 100 MG tablet Commonly known as: DESYREL Take 2 tablets (200 mg total) by mouth at bedtime as needed. for sleep   Ventolin HFA 108 (90 Base) MCG/ACT inhaler Generic drug: albuterol Inhale 2 puffs into the lungs every 4 (four) hours as needed for wheezing or shortness of breath.   albuterol (2.5 MG/3ML) 0.083% nebulizer solution Commonly known as: PROVENTIL Inhale 3 mLs into the lungs every 4 (four) hours as needed for shortness of breath.   vitamin E 180 MG (400 UNITS) capsule Take 400 Units by mouth daily.               Durable Medical Equipment  (From admission, onward)           Start     Ordered   11/20/21 1855  DME Walker rolling  Once       Question:  Patient needs a walker to treat with the following condition  Answer:  Primary osteoarthritis of right hip   11/20/21 1854   11/20/21 1855  DME 3 n 1  Once        11/20/21 1854              Discharge Care Instructions  (From admission, onward)           Start     Ordered   11/21/21 0000  Weight bearing as tolerated       Question Answer Comment  Laterality left   Extremity Lower      11/21/21 0846            Diagnostic Studies: DG C-Arm 1-60 Min-No Report  Result Date: 11/20/2021 Fluoroscopy was utilized by the requesting physician.  No radiographic interpretation.   DG HIP OPERATIVE UNILAT W OR W/O  PELVIS LEFT  Result Date: 11/20/2021 CLINICAL DATA:  Left hip arthroplasty EXAM: OPERATIVE LEFT HIP (WITH PELVIS IF PERFORMED) 2 VIEWS TECHNIQUE: Fluoroscopic spot image(s) were submitted for interpretation post-operatively. COMPARISON:  11/12/2021 FINDINGS: Three fluoroscopic images are obtained during the performance of the procedure and are provided for interpretation only. Images demonstrate placement of a left hip arthroplasty, in the expected position without signs of acute complication. Please refer to the operative report. FLUOROSCOPY TIME:  10 seconds, 1.85 mGy IMPRESSION: 1. Intraoperative evaluation during left hip arthroplasty. Please refer to the operative report. Electronically Signed   By: Randa Ngo M.D.   On: 11/20/2021 16:46    Disposition: Discharge disposition: 01-Home or Self Care       Discharge Instructions     Call MD / Call 911   Complete by: As directed    If you experience chest pain or shortness of breath, CALL 911 and be transported to the hospital emergency room.  If you develope a fever above 101 F, pus (white drainage) or increased drainage or redness at the wound, or calf pain, call your surgeon's office.   Constipation Prevention   Complete by: As directed    Drink plenty of fluids.  Prune juice may be helpful.  You may use a stool softener, such as Colace (over the counter) 100 mg twice a day.  Use MiraLax (over the counter) for constipation as needed.   Diet general   Complete by:  As directed    Do not put a pillow under the knee. Place it under the heel.   Complete by: As directed    Increase activity slowly as tolerated   Complete by: As directed    Post-operative opioid taper instructions:   Complete by: As directed    POST-OPERATIVE OPIOID TAPER INSTRUCTIONS: It is important to wean off of your opioid medication as soon as possible. If you do not need pain medication after your surgery it is ok to stop day one. Opioids include: Codeine,  Hydrocodone(Norco, Vicodin), Oxycodone(Percocet, oxycontin) and hydromorphone amongst others.  Long term and even short term use of opiods can cause: Increased pain response Dependence Constipation Depression Respiratory depression And more.  Withdrawal symptoms can include Flu like symptoms Nausea, vomiting And more Techniques to manage these symptoms Hydrate well Eat regular healthy meals Stay active Use relaxation techniques(deep breathing, meditating, yoga) Do Not substitute Alcohol to help with tapering If you have been on opioids for less than two weeks and do not have pain than it is ok to stop all together.  Plan to wean off of opioids This plan should start within one week post op of your joint replacement. Maintain the same interval or time between taking each dose and first decrease the dose.  Cut the total daily intake of opioids by one tablet each day Next start to increase the time between doses. The last dose that should be eliminated is the evening dose.      Weight bearing as tolerated   Complete by: As directed    Laterality: left   Extremity: Lower        Follow-up Information     Dorna Leitz, MD. Schedule an appointment as soon as possible for a visit in 2 week(s).   Specialty: Orthopedic Surgery Contact information: [place] [address] [phone]                  Signed: Erlene Senters 11/21/2021, 8:47 AM

## 2021-11-21 NOTE — Progress Notes (Signed)
The patient is alert and oriented and has been seen by her physician. The orders for discharge were written. IV has been removed. Went over discharge instructions with patient. She is being discharged via wheelchair with all of her belongings.   

## 2021-11-21 NOTE — TOC Transition Note (Addendum)
Transition of Care [facility]) - CM/SW Discharge Note   Patient Details  Name: [name] MRN: [id number] Date of Birth: Apr 04, 1966  Transition of Care [facility]) CM/SW Contact:  Ross Ludwig, LCSW Phone Number: 11/21/2021, 11:04 AM   Clinical Narrative:     CSW spoke to patient to discuss any DME needs, per patient, she does not need any equipment.  CSW asked if she had a preference for a home health agency and she said [facility].  CSW contacted [facility], and they agreed to accept patient for [facility].  Patient will be going home with home health through [facility].  CSW signing off please reconsult with any other social work needs, home health agency has been notified of planned discharge.   Final next level of care: [place] Barriers to Discharge: Barriers Resolved   Patient Goals and CMS Choice Patient states their goals for this hospitalization and ongoing recovery are:: To return back home with home health. CMS Medicare.gov Compare Post Acute Care list provided to:: Patient Choice offered to / list presented to : Patient  Discharge Placement  Home with home health PT.                     Discharge Plan and Services                          HH Arranged: PT [place] Agency: [place] Date [facility] Agency Contacted: 11/21/21 Time HH Agency Contacted: 1000 Representative spoke with at [place]: Cindie  Social Determinants of Health ([place]) Interventions     Readmission Risk Interventions No flowsheet data found.

## 2021-11-21 NOTE — Evaluation (Signed)
Physical Therapy One Time Evaluation °Patient Details °Name: [name] °MRN: [id number] °DOB: 08/16/1966 °Today's Date: 11/21/2021 ° °History of Present Illness ° Pt is a 55 year old female s/p Left THA direct anterior approach on 11/20/21.  PMHx significant for HTN, fibromyalgia, COPD, Rt TKA 11/23/19  °Clinical Impression ° Patient evaluated by Physical Therapy with no further acute PT needs identified. All education has been completed and the patient has no further questions. See below for any follow-up Physical Therapy or equipment needs. PT is signing off. Thank you for this referral.   °Pt ambulated in hallway, practiced safe stair technique and performed LE exercises in sitting and supine.  Therapist demonstrated standing exercises.  Pt reports understanding and feels ready to d/c home today.  Nurse tech assisted pt to bathroom end of session and HEP handout and ice pack left for pt once finished in bathroom.  Pt plans to have f/u HHPT and virtual therapy upon d/c. ° ° °   ° °Recommendations for follow up therapy are one component of a multi-disciplinary discharge planning process, led by the attending physician.  Recommendations may be updated based on patient status, additional functional criteria and insurance authorization. ° °Follow Up Recommendations Follow physician's recommendations for discharge plan and follow up therapies (pt reports plan for HHPT and virtual therapy?) ° °  °Assistance Recommended at Discharge    °Patient can return home with the following °   ° °  °Equipment Recommendations None recommended by PT  °Recommendations for Other Services °    °  °Functional Status Assessment Patient has had a recent decline in their functional status and demonstrates the ability to make significant improvements in function in a reasonable and predictable amount of time.  ° °  °Precautions / Restrictions Precautions °Precautions: Fall °Restrictions °LLE Weight Bearing: Weight bearing as tolerated   ° °  ° °Mobility ° Bed Mobility °  °  °  °  °  °  °  °General bed mobility comments: pt in recliner on arrival °  ° °Transfers °Overall transfer level: Needs assistance °Equipment used: Rolling walker (2 wheels) °Transfers: Sit to/from Stand °Sit to Stand: Min guard;Supervision °  °  °  °  °  °General transfer comment: verbal cues for hand placement °  ° °Ambulation/Gait °Ambulation/Gait assistance: Min guard °Gait Distance (Feet): 160 Feet °Assistive device: Rolling walker (2 wheels) °Gait Pattern/deviations: Step-to pattern;Decreased stance time - left;Antalgic °Gait velocity: decr °  °  °General Gait Details: verbal cues for sequence, RW positioning, step length; pt took a couple seated rest breaks (before and after stairs) ° °Stairs °Stairs: Yes °Stairs assistance: Min guard °Stair Management: Step to pattern;Forwards;Two rails °Number of Stairs: 3 °General stair comments: verbal cues for sequence and safety, pt reports understanding ° °Wheelchair Mobility °  ° °Modified [place] (Stroke Patients Only) °  ° °  ° °Balance   °  °  °  °  °  °  °  °  °  °  °  °  °  °  °  °  °  °  °   ° ° ° °Pertinent Vitals/Pain Pain Assessment: 0-10 °Pain Score: 5  °Pain Location: left hip and down lateral thigh to knee with ambulating °Pain Descriptors / Indicators: Shooting;Sore;Aching °Pain Intervention(s): Repositioned;Monitored during session;Ice applied;Premedicated before session  ° ° °Home Living Family/patient expects to be discharged to:: Private residence °Living Arrangements: Alone °Available Help at Discharge: Family;Friend(s) °Type of Home: House °Home Access:   Stairs to enter °Entrance Stairs-Rails: Can reach both °Entrance Stairs-Number of Steps: 3 °  °Home Layout: One level °Home Equipment: Rolling Walker (2 wheels);Rollator (4 wheels);Shower seat °   °  °Prior Function Prior Level of Function : Independent/Modified Independent °  °  °  °  °  °  °  °  °  ° ° °Hand Dominance  °   ° °  °Extremity/Trunk Assessment  °    °  ° °Lower Extremity Assessment °Lower Extremity Assessment: LLE deficits/detail °LLE Deficits / Details: anticipated left hip post op weakness, good quad contraction °  ° °   °Communication  ° Communication: No difficulties  °Cognition Arousal/Alertness: Awake/alert °Behavior During Therapy: WFL for tasks assessed/performed °Overall Cognitive Status: Within Functional Limits for tasks assessed °  °  °  °  °  °  °  °  °  °  °  °  °  °  °  °  °  °  °  ° °  °General Comments   ° °  °Exercises Total Joint Exercises °Ankle Circles/Pumps: AROM;Both;10 reps °Quad Sets: AROM;Left;10 reps °Heel Slides: AAROM;Left;10 reps °Hip ABduction/ADduction: AAROM;Left;10 reps °Long Arc Quad: AROM;Left;Seated;10 reps  ° °Assessment/Plan  °  °PT Assessment All further PT needs can be met in the next venue of care  °PT Problem List Decreased mobility;Decreased activity tolerance;Decreased strength;Decreased range of motion;Decreased knowledge of use of DME;Pain ° °   °  °PT Treatment Interventions     ° °PT Goals (Current goals can be found in the Care Plan section)  °Acute Rehab PT Goals °PT Goal Formulation: All assessment and education complete, DC therapy ° °  °Frequency   °  ° ° °Co-evaluation   °  °  °  °  ° ° °  °AM-PAC PT "6 Clicks" Mobility  °Outcome Measure Help needed turning from your back to your side while in a flat bed without using bedrails?: A Little °Help needed moving from lying on your back to sitting on the side of a flat bed without using bedrails?: A Little °Help needed moving to and from a bed to a chair (including a wheelchair)?: A Little °Help needed standing up from a chair using your arms (e.g., wheelchair or bedside chair)?: A Little °Help needed to walk in hospital room?: A Little °Help needed climbing 3-5 steps with a railing? : A Little °6 Click Score: 18 ° °  °End of Session Equipment Utilized During Treatment: Gait belt °Activity Tolerance: Patient tolerated treatment well °Patient left: in chair;with  call bell/phone within reach °Nurse Communication: Mobility status °PT Visit Diagnosis: Difficulty in walking, not elsewhere classified (R26.2) °  ° °Time: 0947-1023 °PT Time Calculation (min) (ACUTE ONLY): 36 min ° ° °Charges:   PT Evaluation °$PT Eval Low Complexity: 1 Low °PT Treatments °$Gait Training: 8-22 mins °  °   °April PT, DPT °Acute Rehabilitation Services °Pager: [phone] °Office: [phone] ° °[name] °11/21/2021, 12:36 PM ° °

## 2021-11-21 NOTE — Plan of Care (Signed)

## 2021-11-21 NOTE — Progress Notes (Signed)
MEWS score now green.  

## 2021-11-21 NOTE — Plan of Care (Signed)
  Problem: Coping: Goal: Level of anxiety will decrease Outcome: Progressing   Problem: Pain Managment: Goal: General experience of comfort will improve Outcome: Progressing   

## 2021-11-21 NOTE — Progress Notes (Addendum)
Notified Gary Fleet, [place] about patient's increased heart rate. Increased heart rate is patient's baseline at home. PA Gary Fleet has already been in the patient's room for rounds and she is medically ready for discharge from his standpoint. Re-assessed patient's pain and she complained of pain 4/10 for her left hip. Patient is alert and oriented and has no chest pain. No assessment changes from patient's baseline. Gave Percocet and Flexeril per patient request for pain. Gary Fleet, PA has no new orders for this patient. He stated for patient to stay on top of pain regimen and use incentive spirometer at home. He also said if patient's heart rate continued to stay elevated, to follow-up with her primary physician. Will re-assess patient's heart rate prior to patient being discharged. Will continue to monitor.       11/21/21 0922  Assess: MEWS Score  Temp 98.3 F (36.8 C)  BP 106/71  Pulse Rate (!) 111  Resp 20  SpO2 95 %  O2 Device Room Air  Assess: MEWS Score  MEWS Temp 0  MEWS Systolic 0  MEWS Pulse 2  MEWS RR 0  MEWS LOC 0  MEWS Score 2  MEWS Score Color Yellow  Assess: if the MEWS score is Yellow or Red  Were vital signs taken at a resting state? Yes  Focused Assessment No change from prior assessment  Does the patient meet 2 or more of the SIRS criteria? No  MEWS guidelines implemented *See Row Information* No, previously yellow, continue vital signs every 4 hours  Take Vital Signs  Increase Vital Sign Frequency   (Pt previously a yellow MEWS)  Escalate  MEWS: Escalate  (Pt previously a yellow MEWS)  Notify: Charge Nurse/RN  Name of Charge Nurse/RN Notified Deanna Artis, RN  Date Charge Nurse/RN Notified 11/20/21  Time Charge Nurse/RN Notified 1000  Notify: Provider  Provider Name/Title Gary Fleet, PA  Date Provider Notified 11/21/21  Time Provider Notified 1025  Notification Type Call  Notification Reason Change in status (About patient's increased heart  rate)  Provider response No new orders Gary Fleet, PA stated to ask about patient's pain and encourage incentive spirometer use.)  Date of Provider Response 11/21/21  Time of Provider Response 1025  Notify: Rapid Response  Name of Rapid Response RN Notified  (Patient is alert and oriented, stable, and no chest pain. Patient is eager to go home.)  Assess: SIRS CRITERIA  SIRS Temperature  0  SIRS Pulse 1  SIRS Respirations  0  SIRS WBC 0  SIRS Score Sum  1

## 2021-11-23 ENCOUNTER — Encounter (HOSPITAL_COMMUNITY): Payer: Self-pay | Admitting: Orthopedic Surgery

## 2021-11-23 NOTE — Addendum Note (Signed)
Addendum  created 11/23/21 1015 by Duane Boston, MD   [place] recorded in [place], [place] filed

## 2021-11-30 ENCOUNTER — Other Ambulatory Visit: Payer: Self-pay | Admitting: Obstetrics & Gynecology

## 2021-11-30 NOTE — Progress Notes (Signed)
Info from [place] reveals ins will not cover Phentermine. If patient desires to continue, may require out of pocket cost coverage  She will need new provider to continue this Rx or weight loss management As I am winding down and not taking new patients, it will need to be someone else  Barnett Applebaum, MD, [place], [facility] 11/30/2021  3:33 PM

## 2022-03-17 ENCOUNTER — Other Ambulatory Visit: Payer: Self-pay | Admitting: Orthopedic Surgery

## 2022-03-17 DIAGNOSIS — M79651 Pain in right thigh: Secondary | ICD-10-CM

## 2022-03-18 ENCOUNTER — Ambulatory Visit
Admission: RE | Admit: 2022-03-18 | Discharge: 2022-03-18 | Disposition: A | Discharge status: Home / Self Care | Payer: BC Managed Care – PPO | Source: Ambulatory Visit | Attending: Orthopedic Surgery | Admitting: Orthopedic Surgery

## 2022-03-18 DIAGNOSIS — M79651 Pain in right thigh: Secondary | ICD-10-CM

## 2022-03-18 MED ORDER — GADOBENATE DIMEGLUMINE 529 MG/ML IV SOLN
20.0000 mL | Freq: Once | INTRAVENOUS | Status: AC | PRN
  Administered 2022-03-18: 20 mL via INTRAVENOUS

## 2022-04-29 ENCOUNTER — Encounter: Payer: Self-pay | Admitting: Dermatology

## 2022-04-29 ENCOUNTER — Ambulatory Visit (INDEPENDENT_AMBULATORY_CARE_PROVIDER_SITE_OTHER): Payer: BC Managed Care – PPO | Admitting: Dermatology

## 2022-04-29 DIAGNOSIS — L689 Hypertrichosis, unspecified: Secondary | ICD-10-CM | POA: Diagnosis not present

## 2022-04-29 DIAGNOSIS — L989 Disorder of the skin and subcutaneous tissue, unspecified: Secondary | ICD-10-CM | POA: Diagnosis not present

## 2022-04-29 DIAGNOSIS — L821 Other seborrheic keratosis: Secondary | ICD-10-CM | POA: Diagnosis not present

## 2022-04-29 DIAGNOSIS — L720 Epidermal cyst: Secondary | ICD-10-CM | POA: Diagnosis not present

## 2022-04-29 DIAGNOSIS — D229 Melanocytic nevi, unspecified: Secondary | ICD-10-CM

## 2022-04-29 DIAGNOSIS — L578 Other skin changes due to chronic exposure to nonionizing radiation: Secondary | ICD-10-CM

## 2022-04-29 NOTE — Progress Notes (Signed)
   Follow-Up Visit   Subjective  [name] is a 56 y.o. female who presents for the following: Skin Problem (Patient has a scaly spot under left eye, present for 3-4 months. Spot at corner of right eye. She would also like to talk about excess hair growth at face, patient is currently shaving twice daily, hair is dark. Patient also with possible lipoma at right inner thigh that has gotten larger and is symptomatic for patient. ).  Patient has had MRI's done at right thigh. She has done compression wraps for about 4 months but did not notice any improvement.   The following portions of the chart were reviewed this encounter and updated as appropriate:   Tobacco  Allergies  Meds  Problems  Med Hx  Surg Hx  Fam Hx      Review of Systems:  No other skin or systemic complaints except as noted in HPI or Assessment and Plan.  Objective  Well appearing patient in no apparent distress; mood and affect are within normal limits.  A focused examination was performed including face, right thigh. Relevant physical exam findings are noted in the Assessment and Plan.  right lateral canthus Smooth white papule  right medial thigh  right medial thigh Soft ill-defined subcutaneous plaque of right medial thigh extending from inguinal fold to distal knee           lower face Excessive hair growth at face    Assessment & Plan  Milia right lateral canthus  Benign-appearing.  Observation.  Call clinic for new or changing lesions.  Recommend daily use of broad spectrum spf 30+ sunscreen to sun-exposed areas.    Disorder of subcutaneous fat right medial thigh  C/w localized adiposity vs asymmetric lipedema   Significantly symptomatic with skin tenderness and pain with pressure, particularly when sleeping at night  She has failed conservative therapy including weight loss (28 lbs) and compression wraps.   Will make referral to general or plastic surgeon to consider  liposuction vs excision   Hypertrichosis lower face  Recommend LHR vs eflornithine cream with [facility]  Patient advised treatments would be cosmetic and not covered with insurance.   Seborrheic Keratoses - Stuck-on, waxy, tan-brown papules and/or plaques  - Benign-appearing - Discussed benign etiology and prognosis. - Observe - Call for any changes  Melanocytic Nevi - Tan-brown and/or pink-flesh-colored symmetric macules and papules - Benign appearing on exam today - Observation - Call clinic for new or changing moles - Recommend daily use of broad spectrum spf 30+ sunscreen to sun-exposed areas.   Actinic Damage - chronic, secondary to cumulative UV radiation exposure/sun exposure over time - diffuse scaly erythematous macules with underlying dyspigmentation - Recommend daily broad spectrum sunscreen SPF 30+ to sun-exposed areas, reapply every 2 hours as needed.  - Recommend staying in the shade or wearing long sleeves, sun glasses (UVA+UVB protection) and wide brim hats (4-inch brim around the entire circumference of the hat). - Call for new or changing lesions.  Return if symptoms worsen or fail to improve.  Anise Salvo, RMA, am acting as scribe for Darden Dates, MD .  Documentation: I have reviewed the above documentation for accuracy and completeness, and I agree with the above.  Darden Dates, MD

## 2022-04-29 NOTE — Patient Instructions (Addendum)
Recommend daily broad spectrum sunscreen SPF 30+ to sun-exposed areas, reapply every 2 hours as needed. Call for new or changing lesions.  Staying in the shade or wearing long sleeves, sun glasses (UVA+UVB protection) and wide brim hats (4-inch brim around the entire circumference of the hat) are also recommended for sun protection.    Recommend taking Heliocare sun protection supplement daily in sunny weather for additional sun protection. For maximum protection on the sunniest days, you can take up to 2 capsules of regular Heliocare OR take 1 capsule of Heliocare Ultra. For prolonged exposure (such as a full day in the sun), you can repeat your dose of the supplement 4 hours after your first dose. Heliocare can be purchased at [facility], at some [facility] or at www.heliocare.com.        Due to recent changes in healthcare laws, you may see results of your pathology and/or laboratory studies on MyChart before the doctors have had a chance to review them. We understand that in some cases there may be results that are confusing or concerning to you. Please understand that not all results are received at the same time and often the doctors may need to interpret multiple results in order to provide you with the best plan of care or course of treatment. Therefore, we ask that you please give us 2 business days to thoroughly review all your results before contacting the office for clarification. Should we see a critical lab result, you will be contacted sooner.   If You Need Anything After Your Visit  If you have any questions or concerns for your doctor, please call our main line at [phone] and press option 4 to reach your doctor's medical assistant. If no one answers, please leave a voicemail as directed and we will return your call as soon as possible. Messages left after 4 pm will be answered the following business day.   You may also send us a message via MyChart. We typically  respond to MyChart messages within 1-2 business days.  For prescription refills, please ask your pharmacy to contact our office. Our fax number is [phone].  If you have an urgent issue when the clinic is closed that cannot wait until the next business day, you can page your doctor at the number below.    Please note that while we do our best to be available for urgent issues outside of office hours, we are not available 24/7.   If you have an urgent issue and are unable to reach us, you may choose to seek medical care at your doctor's office, retail clinic, urgent care center, or emergency room.  If you have a medical emergency, please immediately call 911 or go to the emergency department.  Pager Numbers  - Dr. Kowalski: [phone]  - Dr. Moye: [phone]  - Dr. Stewart: [phone]  In the event of inclement weather, please call our main line at [phone] for an update on the status of any delays or closures.  Dermatology Medication Tips: Please keep the boxes that topical medications come in in order to help keep track of the instructions about where and how to use these. Pharmacies typically print the medication instructions only on the boxes and not directly on the medication tubes.   If your medication is too expensive, please contact our office at [phone] option 4 or send us a message through MyChart.   We are unable to tell what your co-pay for medications will be in advance   on your insurance coverage. However, we may be able to find a substitute medication at lower cost or fill out paperwork to get insurance to cover a needed medication.   If a prior authorization is required to get your medication covered by your insurance company, please allow [place] 1-2 business days to complete this process.  Drug prices often vary depending on where the prescription is filled and some pharmacies may offer cheaper prices.  The website www.goodrx.com  contains coupons for medications through different pharmacies. The prices here do not account for what the cost may be with help from insurance (it may be cheaper with your insurance), but the website can give you the price if you did not use any insurance.  - You can print the associated coupon and take it with your prescription to the pharmacy.  - You may also stop by our office during regular business hours and pick up a GoodRx coupon card.  - If you need your prescription sent electronically to a different pharmacy, notify our office through [facility] or by phone at [phone] option 4.     Si Usted Necesita Algo Despus de Su Visita  Tambin puede enviarnos un mensaje a travs de Pharmacist, community. Por lo general respondemos a los mensajes de MyChart en el transcurso de 1 a 2 das hbiles.  Para renovar recetas, por favor pida a su farmacia que se ponga en contacto con nuestra oficina. Harland Dingwall de fax es [place] [phone].  Si tiene un asunto urgente cuando la clnica est cerrada y que no puede esperar hasta el siguiente da hbil, puede llamar/localizar a su doctor(a) al nmero que aparece a continuacin.   Por favor, tenga en cuenta que aunque hacemos todo lo posible para estar disponibles para asuntos urgentes fuera del horario de [place], no estamos disponibles las 24 horas del da, los 7 das de la [place].   Si tiene un problema urgente y no puede comunicarse con nosotros, puede optar por buscar atencin mdica  en el consultorio de su doctor(a), en una clnica privada, en un centro de atencin urgente o en una sala de emergencias.  Si tiene Engineering geologist, por favor llame inmediatamente al 911 o vaya a la sala de emergencias.  Nmeros de bper  - Dr. Nehemiah Massed: [phone]  - Dra. Moye: [phone]  - Dra. Nicole Kindred: [phone]  En caso de inclemencias del [place], por favor llame a Johnsie Kindred principal al [phone] para una actualizacin sobre el [place]  de cualquier retraso o cierre.  Consejos para la medicacin en dermatologa: Por favor, guarde las cajas en las que vienen los medicamentos de uso tpico para ayudarle a seguir las instrucciones sobre dnde y cmo usarlos. Las farmacias generalmente imprimen las instrucciones del medicamento slo en las cajas y no directamente en los tubos del [place].   Si su medicamento es muy caro, por favor, pngase en contacto con Zigmund Daniel llamando al [phone] y presione la opcin 4 o envenos un mensaje a travs de Pharmacist, community.   No podemos decirle cul ser su copago por los medicamentos por adelantado ya que esto es diferente dependiendo de la cobertura de su seguro. Sin embargo, es posible que podamos encontrar un medicamento sustituto a Electrical engineer un formulario para que el seguro cubra el medicamento que se considera necesario.   Si se requiere una autorizacin previa para que su compaa de seguros [place] su medicamento, por favor permtanos de 1 a 2 das hbiles para completar este proceso.  Los precios de los medicamentos varan con frecuencia dependiendo del Environmental consultant de dnde se surte la receta y alguna farmacias pueden ofrecer precios ms baratos.  El sitio web www.goodrx.com tiene cupones para medicamentos de Airline pilot. Los precios aqu no tienen en cuenta lo que podra costar con la ayuda del seguro (puede ser ms barato con su seguro), pero el sitio web puede darle el precio si no utiliz Research scientist (physical sciences).  - Puede imprimir el cupn correspondiente y llevarlo con su receta a la farmacia.  - Tambin puede pasar por nuestra oficina durante el horario de atencin regular y Charity fundraiser una tarjeta de cupones de GoodRx.  - Si necesita que su receta se enve electrnicamente a una farmacia diferente, informe a nuestra oficina a travs de MyChart de [place] o por telfono llamando al [phone] y presione la opcin 4.

## 2022-05-13 ENCOUNTER — Telehealth: Payer: Self-pay

## 2022-05-13 NOTE — Telephone Encounter (Signed)
-----   Message from Alfonso Patten, MD sent at 05/12/2022 12:17 PM EDT ----- Regarding: call plastic surgeons Hi team, can you please call the [facility] plastic surgeons up in [place] to see if they offer liposuction.  Dr. Marla Roe and Dr. Claudia Desanctis. If they do, please ask if they are ever able to get it covered for symptomatic lipedema or localized lipomatosis.  If not, please try Dr. Tula Nakayama in [place] with the same questions. Thank you!

## 2022-05-13 NOTE — Telephone Encounter (Signed)
[facility] and left voicemail asking them to return my call. Dr. Laurence Ferrari would like to see if liposuction could get covered for symptomatic lipedema or localized lipomatosis for patient [name]., RMA.

## 2022-05-20 ENCOUNTER — Other Ambulatory Visit: Payer: Self-pay

## 2022-05-20 DIAGNOSIS — L989 Disorder of the skin and subcutaneous tissue, unspecified: Secondary | ICD-10-CM

## 2022-09-16 ENCOUNTER — Telehealth: Payer: Self-pay | Admitting: *Deleted

## 2022-09-16 ENCOUNTER — Telehealth: Payer: Self-pay | Admitting: Cardiology

## 2022-09-16 NOTE — Telephone Encounter (Signed)
   Pre-operative Risk Assessment    Patient Name: [name]  DOB: July 05, 1966 MRN: [id number]     Request for Surgical Clearance{  Procedure:   RIGHT CARPOMETACARPAL ARTHROPLASTY  Date of Surgery:  Clearance TBD                               Surgeon:  DR Dorna Leitz Surgeon's Group or Practice Name:  [facility] Phone number:  [phone] Fax number:  [phone]  Type of Clearance Requested:   - Medical    Type of Anesthesia:  General    Additional requests/questions:    Signed, Eli Phillips   09/16/2022, 9:22 AM

## 2022-09-16 NOTE — Telephone Encounter (Signed)
Primary Cardiologist:Brian Agbor-Etang, MD   Preoperative team, please contact this patient and set up a phone call appointment for further preoperative risk assessment. Please obtain consent and complete medication review. Thank you for your help.   I confirm that guidance regarding antiplatelet and oral anticoagulation therapy has been completed and, if necessary, noted below (none requested).   Emmaline Life, NP-C  09/16/2022, 11:07 AM [address], [address] Office [phone] Fax [phone]

## 2022-09-16 NOTE — Telephone Encounter (Signed)
Pt agreeable to plan of care for tele pre op appt 09/22/22 @ 9:40. Med rec and consent are done. Pt wanted appt ASAP so she can her surgery.

## 2022-09-16 NOTE — Telephone Encounter (Signed)
Pt agreeable to plan of care for tele pre op appt 09/22/22 @ 9:40. Med rec and consent are done. Pt wanted appt ASAP so she can her surgery.     Patient Consent for Virtual Visit        [name] has provided verbal consent on 09/16/2022 for a virtual visit (video or telephone).   CONSENT FOR VIRTUAL VISIT FOR:  [name]  By participating in this virtual visit I agree to the following:  I hereby voluntarily request, consent and authorize [facility] and its employed or contracted physicians, physician assistants, nurse practitioners or other licensed health care professionals (the Practitioner), to provide me with telemedicine health care services (the "Services") as deemed necessary by the treating Practitioner. I acknowledge and consent to receive the Services by the Practitioner via telemedicine. I understand that the telemedicine visit will involve communicating with the Practitioner through live audiovisual communication technology and the disclosure of certain medical information by electronic transmission. I acknowledge that I have been given the opportunity to request an in-person assessment or other available alternative prior to the telemedicine visit and am voluntarily participating in the telemedicine visit.  I understand that I have the right to withhold or withdraw my consent to the use of telemedicine in the course of my care at any time, without affecting my right to future care or treatment, and that the Practitioner or I may terminate the telemedicine visit at any time. I understand that I have the right to inspect all information obtained and/or recorded in the course of the telemedicine visit and may receive copies of available information for a reasonable fee.  I understand that some of the potential risks of receiving the Services via telemedicine include:  Delay or interruption in medical evaluation due to technological equipment failure or  disruption; Information transmitted may not be sufficient (e.g. poor resolution of images) to allow for appropriate medical decision making by the Practitioner; and/or  In rare instances, security protocols could fail, causing a breach of personal health information.  Furthermore, I acknowledge that it is my responsibility to provide information about my medical history, conditions and care that is complete and accurate to the best of my ability. I acknowledge that Practitioner's advice, recommendations, and/or decision may be based on factors not within their control, such as incomplete or inaccurate data provided by me or distortions of diagnostic images or specimens that may result from electronic transmissions. I understand that the practice of medicine is not an exact science and that Practitioner makes no warranties or guarantees regarding treatment outcomes. I acknowledge that a copy of this consent can be made available to me via my patient portal ([place]), or I can request a printed copy by calling the office of [facility].    I understand that my insurance will be billed for this visit.   I have read or had this consent read to me. I understand the contents of this consent, which adequately explains the benefits and risks of the Services being provided via telemedicine.  I have been provided ample opportunity to ask questions regarding this consent and the Services and have had my questions answered to my satisfaction. I give my informed consent for the services to be provided through the use of telemedicine in my medical care

## 2022-09-17 ENCOUNTER — Telehealth: Payer: Self-pay

## 2022-09-17 NOTE — Telephone Encounter (Signed)
OV notes and clearance form have been faxed back to [facility]. Nothing further needed at this time.

## 2022-09-22 ENCOUNTER — Ambulatory Visit: Payer: BC Managed Care – PPO | Attending: Internal Medicine | Admitting: Physician Assistant

## 2022-09-22 DIAGNOSIS — Z01810 Encounter for preprocedural cardiovascular examination: Secondary | ICD-10-CM | POA: Diagnosis not present

## 2022-09-22 NOTE — Progress Notes (Signed)
 Virtual Visit via Telephone Note   Because of [name]'s co-morbid illnesses, she is at least at moderate risk for complications without adequate follow up.  This format is felt to be most appropriate for this patient at this time.  The patient did not have access to video technology/had technical difficulties with video requiring transitioning to audio format only (telephone).  All issues noted in this document were discussed and addressed.  No physical exam could be performed with this format.  Please refer to the patient's chart for her consent to telehealth for [facility].  Evaluation Performed:  Preoperative cardiovascular risk assessment _____________   Date:  09/22/2022   Patient ID:  [name], DOB 05/06/1966, MRN [id number] Patient Location:  Home Provider location:   Office  Primary Care Provider:  Pcp, No Primary Cardiologist:  Brian Agbor-Etang, MD  Chief Complaint / Patient Profile   56 y.o. y/o female with a h/o hypertension, arthritis, COPD, previous COVID-19 infection who is pending right carpometacarpal arthroplasty and presents today for telephonic preoperative cardiovascular risk assessment.  Past Medical History    Past Medical History:  Diagnosis Date   Arthritis    COPD (chronic obstructive pulmonary disease) (HCC)    denies   COVID-19 10/23/2019   Asymptomatic   Environmental and seasonal allergies    Falls    Fibromyalgia    History of colon polyps 2017   Hypertension    Pneumonia    aspiration PNA   Wound infection    right foot   Past Surgical History:  Procedure Laterality Date   APPLICATION OF WOUND VAC Right 05/20/2020   Procedure: APPLICATION OF WOUND VAC TO RIGHT FOOT;  Surgeon: Adair, Christopher R, MD;  [facility]: [facility];  Service: Orthopedics;  Laterality: Right;   COLONOSCOPY WITH PROPOFOL N/A 09/03/2016   Procedure: COLONOSCOPY WITH PROPOFOL;  Surgeon: Kiran Anna, MD;  Location: [facility] ENDOSCOPY;  Service:  Endoscopy;  Laterality: N/A;   FRACTURE SURGERY     left foot    INCISION AND DRAINAGE Right 05/20/2020   Procedure: RIGHT FOOT INCISION AND DRAINAGE;  Surgeon: Adair, Christopher R, MD;  Location: [facility];  Service: Orthopedics;  Laterality: Right;  LENGTH OF SURGERY: 1 HOUR   KNEE CARTILAGE SURGERY  2015   left   LAPAROSCOPIC SALPINGOOPHERECTOMY     left hand     1 for ganglion cyst and next yr replace "joints" in left hand   PLANTAR FASCIA SURGERY     left foot   SHOULDER INJECTION Right 05/17/2016   Procedure: SHOULDER INJECTION;  Surgeon: John Graves, MD;  [facility]: [facility];  Service: Orthopedics;  Laterality: Right;   SHOULDER SURGERY     x 2 on right shoulder   TONSILLECTOMY     TOTAL HIP ARTHROPLASTY Left 11/20/2021   Procedure: TOTAL HIP ARTHROPLASTY ANTERIOR APPROACH;  Surgeon: Graves, John, MD;  Location: [facility];  Service: Orthopedics;  Laterality: Left;   TOTAL KNEE ARTHROPLASTY Left 05/17/2016   Procedure: TOTAL KNEE ARTHROPLASTY;  Surgeon: John Graves, MD;  [facility];  Service: Orthopedics;  Laterality: Left;   TOTAL KNEE ARTHROPLASTY Right 11/23/2019   Procedure: TOTAL KNEE ARTHROPLASTY;  Surgeon: Graves, John, MD;  Location: [facility];  Service: Orthopedics;  Laterality: Right;   TOTAL SHOULDER ARTHROPLASTY Left 05/09/2015   Procedure: TOTAL SHOULDER ARTHROPLASTY;  Surgeon: John Graves, MD;  [facility];  Service: Orthopedics;  Laterality: Left;   TUBAL LIGATION     tubes removed stated   patient    Allergies  Allergies  Allergen Reactions   Augmentin [Amoxicillin-Pot Clavulanate] Anaphylaxis    Did it involve swelling of the face/tongue/throat, SOB, or low BP? Yes Did it involve sudden or severe rash/hives, skin peeling, or any reaction on the inside of your mouth or nose? No Did you need to seek medical attention at a hospital or doctor's office? Yes When did it last happen? 15 years ago      If all above answers are "NO", may proceed with cephalosporin use.    Bee  Venom Anaphylaxis, Hives and Shortness Of Breath    Treats with benadryl   Diclofenac Sodium Itching and Swelling    Can tolerate Voltaren  Cannot use PENNSAID- topical gel   Other Anaphylaxis, Hives and Shortness Of Breath    Reaction to spider bites (treats with benadryl)    History of Present Illness    [name] is a 56 y.o. female who presents via Engineer, civil (consulting) for a telehealth visit today.  Pt was last seen in cardiology clinic on 11/11/21 by Christell Faith, PA-C  At that time [name] was doing well.  The patient is now pending procedure as outlined above. Since her last visit, she tells me that she has had no issues with her heart.  She denies chest pain and shortness of breath.  She is getting ready to have surgery on her right hand.  She enjoys riding horses and walks a lot especially for work.  She has been with [employer] for 17 years.  She also is helping raise a 44-year-old child which keeps her busy.  She does all her own housework.  For this reason, she is scored a 5.99 METS on the DASI.  This exceeds the 4 METS minimum requirement.  Patient is on full dose aspirin but no medications were indicated as needing held on her request form.  Home Medications    Prior to Admission medications   Medication Sig Start Date End Date Taking? Authorizing Provider  acetaminophen (TYLENOL) 500 MG tablet Take 1,000 mg by mouth every 8 (eight) hours as needed for moderate pain.    [provider]  albuterol (PROVENTIL) (2.5 MG/3ML) 0.083% nebulizer solution Inhale 3 mLs into the lungs every 4 (four) hours as needed for shortness of breath. Patient not taking: Reported on 09/16/2022 11/01/20   [provider]  aspirin EC 325 MG tablet Take 1 tablet (325 mg total) by mouth 2 (two) times daily after a meal. Take x 1 month post op to decrease risk of blood clots. Patient not taking: Reported on 09/16/2022 11/20/21   Gary Fleet, PA-C  b complex vitamins tablet  Take 1 tablet by mouth daily.    [provider]  chlorpheniramine-HYDROcodone (TUSSIONEX) 10-8 MG/5ML SUER Take 5 mLs by mouth every 12 (twelve) hours as needed for cough. Patient not taking: Reported on 09/16/2022    [provider]  Cholecalciferol (DIALYVITE VITAMIN D 5000) 125 MCG (5000 UT) capsule Take 5,000 Units by mouth daily.    [provider]  cyclobenzaprine (FLEXERIL) 10 MG tablet Take 1 tablet (10 mg total) by mouth 3 (three) times daily as needed for muscle spasms. 11/19/21   [name], MD  diclofenac Sodium (VOLTAREN) 1 % GEL Apply 4 g topically 3 (three) times daily as needed (pain). 02/06/21   [provider]  docusate sodium (COLACE) 100 MG capsule Take 1 capsule (100 mg total) by mouth 2 (two) times daily. 11/20/21  Gary Fleet, PA-C  fluticasone [facility]) 50 MCG/ACT nasal spray Place 2 sprays into both nostrils daily as needed for allergies.    [provider]  hydrochlorothiazide (HYDRODIURIL) 25 MG tablet Take 25 mg by mouth daily.    [provider]  HYDROcodone-acetaminophen (NORCO) 10-325 MG tablet Take 1 tablet by mouth every 6 (six) hours as needed for severe pain. 04/13/22   [provider]  HYDROmorphone (DILAUDID) 2 MG tablet Take 1-2 tablets (2-4 mg total) by mouth every 6 (six) hours as needed for severe pain. Patient not taking: Reported on 09/16/2022 11/20/21   Gary Fleet, PA-C  HYDROmorphone (DILAUDID) 2 MG tablet Take 1-2 tablets (2-4 mg total) by mouth every 6 (six) hours as needed for severe pain. Patient not taking: Reported on 09/16/2022 11/20/21   Gary Fleet, PA-C  ibuprofen (ADVIL) 800 MG tablet Take 800 mg by mouth 3 times/day as needed-between meals & bedtime for moderate pain.    [provider]  losartan (COZAAR) 100 MG tablet Take 100 mg by mouth daily.    [provider]  Multiple Vitamin (MULTIVITAMIN WITH MINERALS) TABS tablet Take 1 tablet by mouth daily.     [provider]  NARCAN 4 MG/0.1ML LIQD nasal spray kit Place 1 spray into the nose as needed (opioid overdose). Patient not taking: Reported on 09/16/2022 05/21/20   [provider]  oxyCODONE-acetaminophen (PERCOCET) 10-325 MG tablet Take 1 tablet by mouth every 4 (four) hours as needed for pain. Patient not taking: Reported on 09/16/2022 08/10/20   [provider]  phentermine (ADIPEX-P) 37.5 MG tablet Take 1 tablet (37.5 mg total) by mouth daily before breakfast. Patient not taking: Reported on 09/16/2022 11/19/21   [name], MD  sodium chloride (OCEAN) 0.65 % nasal spray Place 1 spray into the nose as needed for congestion. Patient not taking: Reported on 09/16/2022    [provider]  traZODone (DESYREL) 100 MG tablet Take 2 tablets (200 mg total) by mouth at bedtime as needed. for sleep 11/19/21   [name], MD  VENTOLIN HFA 108 [phone]) MCG/ACT inhaler Inhale 2 puffs into the lungs every 4 (four) hours as needed for wheezing or shortness of breath.  03/19/20   [provider]  vitamin E 180 MG (400 UNITS) capsule Take 400 Units by mouth daily.    [provider]    Physical Exam    Vital Signs:  [name] does not have vital signs available for review today.  Given telephonic nature of communication, physical exam is limited. AAOx3. NAD. Normal affect.  Speech and respirations are unlabored.  Accessory Clinical Findings    None  Assessment & Plan    1.  Preoperative Cardiovascular Risk Assessment:  [name] perioperative risk of a major cardiac event is 0.4% according to the Revised Cardiac Risk Index (RCRI).  Therefore, she is at low risk for perioperative complications.   Her functional capacity is good at 5.99 METs according to the Duke Activity Status Index (DASI). Recommendations: According to ACC/AHA guidelines, no further cardiovascular testing needed.  The patient may proceed to surgery at  acceptable risk.     A copy of this note will be routed to requesting surgeon.  Time:   Today, I have spent 6 minutes with the patient with telehealth technology discussing medical history, symptoms, and management plan.     Elgie Collard, PA-C  09/22/2022, 9:47 AM

## 2022-11-23 DIAGNOSIS — M1811 Unilateral primary osteoarthritis of first carpometacarpal joint, right hand: Secondary | ICD-10-CM | POA: Diagnosis not present

## 2022-11-29 DIAGNOSIS — M1811 Unilateral primary osteoarthritis of first carpometacarpal joint, right hand: Secondary | ICD-10-CM | POA: Diagnosis not present

## 2022-12-09 DIAGNOSIS — M25531 Pain in right wrist: Secondary | ICD-10-CM | POA: Diagnosis not present

## 2022-12-17 DIAGNOSIS — M1811 Unilateral primary osteoarthritis of first carpometacarpal joint, right hand: Secondary | ICD-10-CM | POA: Diagnosis not present

## 2022-12-27 ENCOUNTER — Telehealth: Payer: Self-pay | Admitting: Cardiology

## 2022-12-27 NOTE — Telephone Encounter (Signed)
Patient declined to schedule follow up States she is fine and was only seen for preop Deleted recall

## 2022-12-27 NOTE — Telephone Encounter (Signed)
Noted, thanks!

## 2023-01-06 DIAGNOSIS — G5601 Carpal tunnel syndrome, right upper limb: Secondary | ICD-10-CM | POA: Diagnosis not present

## 2023-03-09 NOTE — Telephone Encounter (Signed)
Error

## 2024-03-07 ENCOUNTER — Other Ambulatory Visit: Payer: Self-pay | Admitting: Orthopedic Surgery

## 2024-03-07 DIAGNOSIS — G8929 Other chronic pain: Secondary | ICD-10-CM

## 2024-03-08 ENCOUNTER — Telehealth: Payer: Self-pay | Admitting: Cardiology

## 2024-03-08 ENCOUNTER — Other Ambulatory Visit: Payer: Self-pay | Admitting: Orthopedic Surgery

## 2024-03-08 ENCOUNTER — Encounter: Payer: Self-pay | Admitting: Cardiology

## 2024-03-08 NOTE — Telephone Encounter (Signed)
 Error

## 2024-03-08 NOTE — Telephone Encounter (Signed)
   Pre-operative Risk Assessment    Patient Name: [name]  DOB: 07-28-1966 MRN: [id number]   Date of last office visit: 09/22/22 Date of next office visit: n/a   Request for Surgical Clearance    Procedure:  Right Reverse Total Shoulder Arthroplasty  Date of Surgery:  Clearance 04/26/24                                Surgeon:  Sammye Cristal, MD Surgeon's Group or Practice Name:  [facility]  Phone number:  [phone] Fax number:  [phone]   Type of Clearance Requested:   - Medical    Type of Anesthesia:  Choice   Additional requests/questions:    Signed, Fletcher Humble   03/08/2024, 3:19 PM

## 2024-03-09 NOTE — Telephone Encounter (Signed)
 Pt has been scheduled to see Varney Gentleman, [facility] 03/14/24 @ 10:55 for preop clearance.

## 2024-03-09 NOTE — Telephone Encounter (Signed)
   Name: [name]  DOB: 1966-02-15  MRN: [id number]  Primary Cardiologist: Constancia Delton, MD  Chart reviewed as part of pre-operative protocol coverage. Because of [name]'s past medical history and time since last visit, she will require a follow-up in-office visit in order to better assess preoperative cardiovascular risk.  Pre-op covering staff: - Please schedule appointment and call patient to inform them. If patient already had an upcoming appointment within acceptable timeframe, please add "pre-op clearance" to the appointment notes so provider is aware. - Please contact requesting surgeon's office via preferred method (i.e, phone, fax) to inform them of need for appointment prior to surgery.    Ava Boatman, NP  03/09/2024, 10:24 AM

## 2024-03-13 NOTE — Progress Notes (Deleted)
 Cardiology Office Note    Date:  03/13/2024   ID:  [name], DOB 1966/10/26, MRN [id number]  PCP:  Pcp, No  Cardiologist:  Constancia Delton, MD  Electrophysiologist:  None   Chief Complaint: Preoperative cardiac risk stratification   History of Present Illness:   [name] is a 58 y.o. female with history of ***  ***  Duke Activity Status Index: Revised Cardiac Risk Index:   Labs independently reviewed: 06/2023 - A1c 5.9, potassium 4.4, BUN 21, serum creatinine 0.8, albumin 3.9, AST/ALT normal, Hgb 13.4, PLT 395, TC 178, TG 136, HDL 60, LDL 91, TSH normal  Past Medical History:  Diagnosis Date   Arthritis    COPD (chronic obstructive pulmonary disease) (HCC)    denies   COVID-19 10/23/2019   Asymptomatic   Environmental and seasonal allergies    Falls    Fibromyalgia    History of colon polyps 2017   Hypertension    Pneumonia    aspiration PNA   Wound infection    right foot    Past Surgical History:  Procedure Laterality Date   APPLICATION OF WOUND VAC Right 05/20/2020   Procedure: APPLICATION OF WOUND VAC TO RIGHT FOOT;  Surgeon: Donnamarie Gables, MD;  Location: [facility] OR;  Service: Orthopedics;  Laterality: Right;   COLONOSCOPY WITH PROPOFOL  N/A 09/03/2016   Procedure: COLONOSCOPY WITH PROPOFOL ;  Surgeon: Luke Salaam, MD;  Location: [facility] ENDOSCOPY;  Service: Endoscopy;  Laterality: N/A;   FRACTURE SURGERY     left foot    INCISION AND DRAINAGE Right 05/20/2020   Procedure: RIGHT FOOT INCISION AND DRAINAGE;  Surgeon: Donnamarie Gables, MD;  Location: [facility] OR;  Service: Orthopedics;  Laterality: Right;  LENGTH OF SURGERY: 1 HOUR   KNEE CARTILAGE SURGERY  2015   left   LAPAROSCOPIC SALPINGOOPHERECTOMY     left hand     1 for ganglion cyst and next yr replace "joints" in left hand   PLANTAR FASCIA SURGERY     left foot   SHOULDER INJECTION Right 05/17/2016   Procedure: SHOULDER INJECTION;  Surgeon: Neil Balls, MD;  Location: [facility];  Service:  Orthopedics;  Laterality: Right;   SHOULDER SURGERY     x 2 on right shoulder   TONSILLECTOMY     TOTAL HIP ARTHROPLASTY Left 11/20/2021   Procedure: TOTAL HIP ARTHROPLASTY ANTERIOR APPROACH;  Surgeon: Neil Balls, MD;  Location: [facility];  Service: Orthopedics;  Laterality: Left;   TOTAL KNEE ARTHROPLASTY Left 05/17/2016   Procedure: TOTAL KNEE ARTHROPLASTY;  Surgeon: Neil Balls, MD;  [facility];  Service: Orthopedics;  Laterality: Left;   TOTAL KNEE ARTHROPLASTY Right 11/23/2019   Procedure: TOTAL KNEE ARTHROPLASTY;  Surgeon: Neil Balls, MD;  Location: [facility];  Service: Orthopedics;  Laterality: Right;   TOTAL SHOULDER ARTHROPLASTY Left 05/09/2015   Procedure: TOTAL SHOULDER ARTHROPLASTY;  Surgeon: Neil Balls, MD;  Location: [facility];  Service: Orthopedics;  Laterality: Left;   TUBAL LIGATION     tubes removed stated patient    Current Medications: No outpatient medications have been marked as taking for the 03/14/24 encounter (Appointment) with Roark Chick, PA-C.    Allergies:   Augmentin [amoxicillin-pot clavulanate], Bee venom, Diclofenac sodium, and Other   Social History   Socioeconomic History   Marital status: Single    Spouse name: Not on file   Number of children: Not on file   Years of education: Not on file   Highest education level:  Not on file  Occupational History   Not on file  Tobacco Use   Smoking status: Former    Current packs/day: 0.00    Average packs/day: 1 pack/day for 30.0 years (30.0 ttl pk-yrs)    Types: Cigarettes    Start date: 11/04/1991    Quit date: 11/03/2021    Years since quitting: 2.3   Smokeless tobacco: Never  Vaping Use   Vaping status: Never Used  Substance and Sexual Activity   Alcohol use: Yes    Comment: occassional   Drug use: No   Sexual activity: Yes    Birth control/protection: None  Other Topics Concern   Not on file  Social History Narrative   Not on file   Social Drivers of Health   Financial Resource Strain:  Not on file  Food Insecurity: Not on file  Transportation Needs: Not on file  Physical Activity: Sufficiently Active (12/14/2017)   Exercise Vital Sign    Days of Exercise per Week: 7 days    Minutes of Exercise per Session: 150+ min  Stress: Stress Concern Present (12/14/2017)   [employer] of Occupational Health - Occupational Stress Questionnaire    Feeling of Stress : Rather much  Social Connections: Somewhat Isolated (12/14/2017)   Social Connection and Isolation Panel [NHANES]    Frequency of Communication with Friends and Family: More than three times a week    Frequency of Social Gatherings with Friends and Family: More than three times a week    Attends Religious Services: More than 4 times per year    Active Member of [employer] or Organizations: No    Attends Banker Meetings: Never    Marital Status: Never married     Family History:  The patient's family history includes Crohn's disease in her mother; Heart attack in her father; Hypertension in her brother and brother; Stroke in her father.  ROS:   12-point review of systems is negative unless otherwise noted in the HPI.   EKGs/Labs/Other Studies Reviewed:    Studies reviewed were summarized above. The additional studies were reviewed today:  2D echo 09/17/2020: 1. Left ventricular ejection fraction, by estimation, is 60 to 65%. The  left ventricle has normal function. The left ventricle has no regional  wall motion abnormalities. Left ventricular diastolic parameters are  indeterminate.   2. Right ventricular systolic function is normal. The right ventricular  size is normal. There is mildly elevated pulmonary artery systolic  pressure.   3. The mitral valve is normal in structure. Mild mitral valve  regurgitation. No evidence of mitral stenosis.   4. The aortic valve is tricuspid. Aortic valve regurgitation is not  visualized. Mild aortic valve sclerosis is present, with no evidence of  aortic valve  stenosis.   5. The inferior vena cava is normal in size with greater than 50%  respiratory variability, suggesting right atrial pressure of 3 mmHg.   EKG:  EKG is ordered today.  The EKG ordered today demonstrates ***  Recent Labs: No results found for requested labs within last 365 days.  Recent Lipid Panel No results found for: "CHOL", "TRIG", "HDL", "CHOLHDL", "VLDL", "LDLCALC", "LDLDIRECT"  PHYSICAL EXAM:    VS:  There were no vitals taken for this visit.  BMI: There is no height or weight on file to calculate BMI.  Physical Exam  Wt Readings from Last 3 Encounters:  11/21/21 209 lb 15.8 oz (95.2 kg)  11/19/21 210 lb (95.3 kg)  11/13/21 208 lb (  94.3 kg)     ASSESSMENT & PLAN:   Preoperative cardiac risk stratification:  HTN: Blood pressure  Obesity:   {Are you ordering a CV Procedure (e.g. stress test, cath, DCCV, TEE, etc)?   Press F2        :[id number]}     Disposition: F/u with Dr. Avie Lemme or an APP in ***.   Medication Adjustments/Labs and Tests Ordered: Current medicines are reviewed at length with the patient today.  Concerns regarding medicines are outlined above. Medication changes, Labs and Tests ordered today are summarized above and listed in the Patient Instructions accessible in Encounters.   Signed, Varney Gentleman, PA-C 03/13/2024 9:01 AM     [facility] [place] [address] [place], [place] [address] [phone]

## 2024-03-14 ENCOUNTER — Ambulatory Visit: Admitting: Medical

## 2024-03-15 ENCOUNTER — Inpatient Hospital Stay: Admission: RE | Admit: 2024-03-15 | Source: Ambulatory Visit

## 2024-03-16 ENCOUNTER — Ambulatory Visit
Admission: RE | Admit: 2024-03-16 | Discharge: 2024-03-16 | Disposition: A | Discharge status: Home / Self Care | Source: Ambulatory Visit | Attending: Orthopedic Surgery | Admitting: Orthopedic Surgery

## 2024-03-16 ENCOUNTER — Ambulatory Visit: Attending: Cardiology | Admitting: Cardiology

## 2024-03-16 ENCOUNTER — Encounter: Payer: Self-pay | Admitting: Cardiology

## 2024-03-16 VITALS — BP 126/72 | HR 124 | Ht 60.0 in | Wt 201.1 lb

## 2024-03-16 DIAGNOSIS — Z01810 Encounter for preprocedural cardiovascular examination: Secondary | ICD-10-CM | POA: Diagnosis not present

## 2024-03-16 DIAGNOSIS — F17200 Nicotine dependence, unspecified, uncomplicated: Secondary | ICD-10-CM

## 2024-03-16 DIAGNOSIS — I10 Essential (primary) hypertension: Secondary | ICD-10-CM | POA: Diagnosis not present

## 2024-03-16 DIAGNOSIS — G8929 Other chronic pain: Secondary | ICD-10-CM

## 2024-03-16 NOTE — Progress Notes (Signed)
 Cardiology Office Note:    Date:  03/16/2024   ID:  [name], DOB 23-Mar-1966, MRN [id number]  PCP:  Pcp, No  [facility] Cardiologist:  Constancia Delton, MD  [facility] Electrophysiologist:  None   Referring MD: No ref. provider found   Chief Complaint  Patient presents with   Preoperative clearance    Preoperative clearance for reverse shoulder replacement. Patient is doing ok on today. Meds reviewed.     History of Present Illness:    [name] is a 58 y.o. female with a hx of hypertension, diabetes, COPD, current smoker x30+ years varicose veins, who presents for preprocedural evaluation.  Patient with right shoulder pain, right shoulder arthroplasty being planned by surgical team.  She denies chest pain or shortness of breath.  She still smokes but is working on quitting.  Compliant with BP medications as prescribed.  Blood pressure adequately controlled.  Feels well, has no cardiac concerns at this time.  Prior notes/testing Echo 2021 EF 60 to 65% Lower extremity ultrasound venous with reflux showed bilateral venous insufficiency.  Past Medical History:  Diagnosis Date   Arthritis    COPD (chronic obstructive pulmonary disease) (HCC)    denies   COVID-19 10/23/2019   Asymptomatic   Environmental and seasonal allergies    Falls    Fibromyalgia    History of colon polyps 2017   Hypertension    Pneumonia    aspiration PNA   Wound infection    right foot    Past Surgical History:  Procedure Laterality Date   APPLICATION OF WOUND VAC Right 05/20/2020   Procedure: APPLICATION OF WOUND VAC TO RIGHT FOOT;  Surgeon: Donnamarie Gables, MD;  Location: [facility] OR;  Service: Orthopedics;  Laterality: Right;   COLONOSCOPY WITH PROPOFOL  N/A 09/03/2016   Procedure: COLONOSCOPY WITH PROPOFOL ;  Surgeon: Luke Salaam, MD;  Location: [facility] ENDOSCOPY;  Service: Endoscopy;  Laterality: N/A;   FRACTURE SURGERY     left foot    INCISION AND DRAINAGE Right 05/20/2020    Procedure: RIGHT FOOT INCISION AND DRAINAGE;  Surgeon: Donnamarie Gables, MD;  Location: [facility] OR;  Service: Orthopedics;  Laterality: Right;  LENGTH OF SURGERY: 1 HOUR   KNEE CARTILAGE SURGERY  2015   left   LAPAROSCOPIC SALPINGOOPHERECTOMY     left hand     1 for ganglion cyst and next yr replace "joints" in left hand   PLANTAR FASCIA SURGERY     left foot   SHOULDER INJECTION Right 05/17/2016   Procedure: SHOULDER INJECTION;  Surgeon: Neil Balls, MD;  Location: [facility] OR;  Service: Orthopedics;  Laterality: Right;   SHOULDER SURGERY     x 2 on right shoulder   TONSILLECTOMY     TOTAL HIP ARTHROPLASTY Left 11/20/2021   Procedure: TOTAL HIP ARTHROPLASTY ANTERIOR APPROACH;  Surgeon: Neil Balls, MD;  Location: [facility] ORS;  Service: Orthopedics;  Laterality: Left;   TOTAL KNEE ARTHROPLASTY Left 05/17/2016   Procedure: TOTAL KNEE ARTHROPLASTY;  Surgeon: Neil Balls, MD;  Location: [facility] OR;  Service: Orthopedics;  Laterality: Left;   TOTAL KNEE ARTHROPLASTY Right 11/23/2019   Procedure: TOTAL KNEE ARTHROPLASTY;  Surgeon: Neil Balls, MD;  Location: [facility] ORS;  Service: Orthopedics;  Laterality: Right;   TOTAL SHOULDER ARTHROPLASTY Left 05/09/2015   Procedure: TOTAL SHOULDER ARTHROPLASTY;  Surgeon: Neil Balls, MD;  Location: [facility] OR;  Service: Orthopedics;  Laterality: Left;   TUBAL LIGATION     tubes removed stated patient  Current Medications: Current Meds  Medication Sig   acetaminophen  (TYLENOL ) 500 MG tablet Take 1,000 mg by mouth every 8 (eight) hours as needed for moderate pain.   b complex vitamins tablet Take 1 tablet by mouth daily.   Cholecalciferol (DIALYVITE VITAMIN D 5000) 125 MCG (5000 UT) capsule Take 5,000 Units by mouth daily.   hydrochlorothiazide  (HYDRODIURIL ) 25 MG tablet Take 25 mg by mouth daily.   ibuprofen  (ADVIL ) 800 MG tablet Take 800 mg by mouth 3 times/day as needed-between meals & bedtime for moderate pain.   losartan  (COZAAR ) 100 MG tablet Take 100 mg by mouth daily.    vitamin E 180 MG (400 UNITS) capsule Take 400 Units by mouth daily.     Allergies:   Amoxicillin-pot clavulanate, Bee venom, Diclofenac sodium, Diclofenac sodium, Other, and Sulfa antibiotics   Social History   Socioeconomic History   Marital status: Single    Spouse name: Not on file   Number of children: Not on file   Years of education: Not on file   Highest education level: Not on file  Occupational History   Not on file  Tobacco Use   Smoking status: Former    Current packs/day: 0.00    Average packs/day: 1 pack/day for 30.0 years (30.0 ttl pk-yrs)    Types: Cigarettes    Start date: 11/04/1991    Quit date: 11/03/2021    Years since quitting: 2.3   Smokeless tobacco: Never  Vaping Use   Vaping status: Never Used  Substance and Sexual Activity   Alcohol use: Yes    Comment: occassional   Drug use: No   Sexual activity: Yes    Birth control/protection: None  Other Topics Concern   Not on file  Social History Narrative   Not on file   Social Drivers of Health   Financial Resource Strain: Not on file  Food Insecurity: Not on file  Transportation Needs: Not on file  Physical Activity: Sufficiently Active (12/14/2017)   Exercise Vital Sign    Days of Exercise per Week: 7 days    Minutes of Exercise per Session: 150+ min  Stress: Stress Concern Present (12/14/2017)   [employer] of Occupational Health - Occupational Stress Questionnaire    Feeling of Stress : Rather much  Social Connections: Somewhat Isolated (12/14/2017)   Social Connection and Isolation Panel [NHANES]    Frequency of Communication with Friends and Family: More than three times a week    Frequency of Social Gatherings with Friends and Family: More than three times a week    Attends Religious Services: More than 4 times per year    Active Member of [employer] or Organizations: No    Attends Banker Meetings: Never    Marital Status: Never married     Family History: The patient's  family history includes Crohn's disease in her mother; Heart attack in her father; Hypertension in her brother and brother; Stroke in her father.  ROS:   Please see the history of present illness.     All other systems reviewed and are negative.  EKGs/Labs/Other Studies Reviewed:    The following studies were reviewed today:  EKG Interpretation Date/Time:  Friday Mar 16 2024 16:11:50 EDT Ventricular Rate:  124 PR Interval:  150 QRS Duration:  60 QT Interval:  306 QTC Calculation: 439 R Axis:   40  Text Interpretation: Sinus tachycardia Confirmed by Constancia Delton ([id number]) on 03/16/2024 4:14:43 PM    Recent Labs: No results found  for requested labs within last 365 days.  Recent Lipid Panel No results found for: "CHOL", "TRIG", "HDL", "CHOLHDL", "VLDL", "LDLCALC", "LDLDIRECT"   Risk Assessment/Calculations:      Physical Exam:    VS:  BP 126/72   Pulse (!) 124   Ht 5' (1.524 m)   Wt 201 lb 1.6 oz (91.2 kg)   SpO2 97%   BMI 39.27 kg/m     Wt Readings from Last 3 Encounters:  03/16/24 201 lb 1.6 oz (91.2 kg)  11/21/21 209 lb 15.8 oz (95.2 kg)  11/19/21 210 lb (95.3 kg)     GEN:  Well nourished, well developed in no acute distress HEENT: Normal NECK: No JVD; No carotid bruits CARDIAC: RRR, no murmurs, rubs, gallops RESPIRATORY: Diminished breath sounds, no wheezing. ABDOMEN: Soft, non-tender, distended MUSCULOSKELETAL:  no edema; varicose veins noted. SKIN: Warm and dry NEUROLOGIC:  Alert and oriented x 3 PSYCHIATRIC:  Normal affect   ASSESSMENT:    1. Preop cardiovascular exam   2. Primary hypertension   3. Current smoker    PLAN:    In order of problems listed above:  Preprocedural cardiovascular exam.  Right shoulder arthroplasty being planned.  Patient denies chest pain or shortness of breath.  Previous echo with normal EF.  Okay for procedure from a cardiac perspective, no additional cardiac testing indicated at this time. hypertension, BP  controlled.  Continue losartan  100 mg daily, HCTZ 25 mg daily. Current smoker, cessation recommended.  Follow-up as needed.   Medication Adjustments/Labs and Tests Ordered: Current medicines are reviewed at length with the patient today.  Concerns regarding medicines are outlined above.  Orders Placed This Encounter  Procedures   EKG 12-Lead   No orders of the defined types were placed in this encounter.   There are no Patient Instructions on file for this visit.   Signed, Constancia Delton, MD  03/16/2024 4:46 PM    [facility]

## 2024-03-20 NOTE — Telephone Encounter (Signed)
 Preoperative risk assessment performed by Dr. Junnie Olives on 03/16/2024 and addressed in office note. Forwarded note to requesting office.  Will remove from pre-op pool.   Lonell Rives. Brayton Baumgartner, DNP, NP-C  03/20/2024, 11:47 AM [facility] [address] Office [phone] Fax [phone]

## 2024-03-20 NOTE — Telephone Encounter (Signed)
 Received 2nd request  Patient seen 05/09

## 2024-03-21 ENCOUNTER — Telehealth: Payer: Self-pay | Admitting: Pulmonary Disease

## 2024-03-21 NOTE — Telephone Encounter (Signed)
 Fax received from Dr. Sammye Cristal with [facility] to perform a right reverse shoulder arthroplasty under choice anesthesia on patient.  Patient needs surgery clearance. Surgery is 04/26/24. Patient was seen on 11/13/21. Office protocol is a risk assessment can be sent to surgeon if patient has been seen in 60 days or less.   Pt needs appt. I sent fax to [facility] informing them pt will need ov here before we can clear her. Called the pt and there was no answer- left her a detailed msg to call and schedule ov. Routing to clearance pool for now.

## 2024-03-21 NOTE — Telephone Encounter (Signed)
 Patient returned call to speak with leslie but I did get her scheduled for a appointment for surgery clearance

## 2024-04-04 ENCOUNTER — Ambulatory Visit: Admitting: Pulmonary Disease

## 2024-04-04 ENCOUNTER — Encounter: Payer: Self-pay | Admitting: Pulmonary Disease

## 2024-04-04 VITALS — BP 122/60 | HR 113 | Temp 98.2°F | Ht 60.0 in | Wt 200.6 lb

## 2024-04-04 DIAGNOSIS — R0609 Other forms of dyspnea: Secondary | ICD-10-CM

## 2024-04-04 DIAGNOSIS — Z87891 Personal history of nicotine dependence: Secondary | ICD-10-CM | POA: Diagnosis not present

## 2024-04-04 DIAGNOSIS — F17201 Nicotine dependence, unspecified, in remission: Secondary | ICD-10-CM

## 2024-04-04 DIAGNOSIS — Z01818 Encounter for other preprocedural examination: Secondary | ICD-10-CM

## 2024-04-04 NOTE — Progress Notes (Signed)
 @Patient  ID: [name], female    DOB: [birth year], 58 y.o.   MRN: [id number]  No chief complaint on file.   Referring provider: No ref. provider found  HPI:   58 y.o. whom we are seeing in follow-up dyspnea on exertion as well as preoperative evaluation.  Most recent cardiology note reviewed.  She is doing well.  No DOE. Not using inhalers.  HPI initial visit: Patient states breathing is at baseline.  Denies significant dyspnea.  No real exertional limitation.  She is used Spiriva DPI in the past unsure if it helped much.  Currently prescribed Spiriva Respimat but a lot of sensation of the chest in the back of her throat.  Not using really.  Told by her PCP that she had COPD and this prompted a referral.  She denies any history spirometry or breathing test.  She was told she is done in the past but she does not recall this.  None to review in the EMR at this time.  Last x-ray in EMR 05/07/2016 reviewed and interpreted as clear lungs, mild hyperinflation on lateral view with increased lucency between heart and anterior chest.  Reviewed TTE 09/17/2020 which was essentially normal, no clear cause for dyspnea.  PMH: tobacco abuse, HTN Surgical history: left foot surgery, knee surgery Family history: CAD, stroke in father Social history: Lives in [place], current every day smoker, ~30 pack year       Public house manager / Pulmonary Flowsheets:   ACT:      No data to display          MMRC:     No data to display          Epworth:      No data to display          Tests:   FENO:  No results found for: "NITRICOXIDE"  PFT:     No data to display          WALK:      No data to display          Imaging: Personally reviewed and as per EMR and discussion in this note CT SHOULDER RIGHT WO CONTRAST Result Date: 03/17/2024 CLINICAL DATA:  Chronic right shoulder pain, preoperative planning EXAM: CT OF THE UPPER RIGHT EXTREMITY WITHOUT CONTRAST  TECHNIQUE: Multidetector CT imaging of the upper right extremity was performed according to the standard protocol. RADIATION DOSE REDUCTION: This exam was performed according to the departmental dose-optimization program which includes automated exposure control, adjustment of the mA and/or kV according to patient size and/or use of iterative reconstruction technique. COMPARISON:  08/30/2020 FINDINGS: Bones/Joint/Cartilage No fracture or dislocation. Normal alignment. No joint effusion. Severe osteoarthritis of the glenohumeral joint. Large subcortical cyst at the supraspinatus insertion. Chronic widening of the [facility] joint. Ligaments Ligaments are suboptimally evaluated by CT. Muscles and Tendons Mild atrophy of the subscapularis and infraspinatus muscles. Rotator cuff is limited in evaluation by CT, there appears to be severe supraspinatus and infraspinatus tendinosis with a full-thickness tear of the posterior aspect of the supraspinatus tendon. Soft tissue No fluid collection or hematoma. No soft tissue mass. Visualized portions of the lung are clear. IMPRESSION: 1. Severe osteoarthritis of the right glenohumeral joint. 2. Rotator cuff is limited in evaluation by CT, there appears to be severe supraspinatus and infraspinatus tendinosis with a full-thickness tear of the posterior aspect of the supraspinatus tendon. Electronically Signed   By: Onnie Bilis M.D.   On: 03/17/2024 10:40  Lab Results: Personally reviewed, notably eos 400 07/2020 CBC    Component Value Date/Time   WBC 11.6 (H) 11/21/2021 0311   RBC 3.70 (L) 11/21/2021 0311   HGB 11.6 (L) 11/21/2021 0311   HGB 14.6 09/20/2012 1134   HCT 35.7 (L) 11/21/2021 0311   HCT 44.0 09/20/2012 1134   PLT 348 11/21/2021 0311   PLT 377 09/20/2012 1134   MCV 96.5 11/21/2021 0311   MCV 94 09/20/2012 1134   MCH 31.4 11/21/2021 0311   MCHC 32.5 11/21/2021 0311   RDW 12.4 11/21/2021 0311   RDW 13.0 09/20/2012 1134   LYMPHSABS 3.2 04/29/2021 0846    MONOABS 0.8 04/29/2021 0846   EOSABS 0.3 04/29/2021 0846   BASOSABS 0.0 04/29/2021 0846    BMET    Component Value Date/Time   NA 137 11/12/2021 1332   NA 139 09/20/2012 1134   K 4.2 11/12/2021 1332   K 4.4 09/20/2012 1134   CL 103 11/12/2021 1332   CL 103 09/20/2012 1134   CO2 29 11/12/2021 1332   CO2 28 09/20/2012 1134   GLUCOSE 126 (H) 11/12/2021 1332   GLUCOSE 87 09/20/2012 1134   BUN 18 11/12/2021 1332   BUN 17 09/20/2012 1134   CREATININE 1.13 (H) 11/12/2021 1332   CREATININE 0.72 09/20/2012 1134   CALCIUM 9.1 11/12/2021 1332   CALCIUM 8.8 09/20/2012 1134   GFRNONAA 57 (L) 11/12/2021 1332   GFRNONAA >60 09/20/2012 1134   GFRAA >60 07/21/2020 1147   GFRAA >60 09/20/2012 1134    BNP No results found for: "BNP"  ProBNP No results found for: "PROBNP"  Specialty Problems       Pulmonary Problems   OTHER RESPIRATORY COMPLICATIONS   Qualifier: Diagnosis of  By: Ta MD, Cat         Allergies  Allergen Reactions   Amoxicillin-Pot Clavulanate Anaphylaxis, Hives, Rash, Shortness Of Breath and Swelling    Did it involve swelling of the face/tongue/throat, SOB, or low BP? Yes  Did it involve sudden or severe rash/hives, skin peeling, or any reaction on the inside of your mouth or nose? No  Did you need to seek medical attention at a hospital or doctor's office? Yes  When did it last happen? 15 years ago       If all above answers are "NO", may proceed with cephalosporin use.   Bee Venom Anaphylaxis, Hives and Shortness Of Breath    Treats with benadryl    Diclofenac Sodium Itching and Swelling    Can tolerate Voltaren  Cannot use PENNSAID- topical gel   Diclofenac Sodium Hives, Itching, Other (See Comments) and Swelling    Other Reaction(s): Unknown  Can tolerate Voltaren  Cannot use PENNSAID- topical gel   Other Anaphylaxis, Hives and Shortness Of Breath    Reaction to spider bites (treats with benadryl )   Sulfa Antibiotics Anaphylaxis and Other (See  Comments)    Childhood allergic reaction  Sulfonamides  Patient reports she had a sulfa antibiotic last year, and did not have a reaction    Immunization History  Administered Date(s) Administered   Hepb-cpg 04/06/2018, 06/20/2018   Tdap 06/09/2011   Zoster Recombinant(Shingrix) 06/20/2018, 10/20/2018    Past Medical History:  Diagnosis Date   Arthritis    COPD (chronic obstructive pulmonary disease) (HCC)    denies   COVID-19 10/23/2019   Asymptomatic   Environmental and seasonal allergies    Falls    Fibromyalgia    History of colon polyps 2017  Hypertension    Pneumonia    aspiration PNA   Wound infection    right foot    Tobacco History: Social History   Tobacco Use  Smoking Status Former   Current packs/day: 0.00   Average packs/day: 1 pack/day for 30.0 years (30.0 ttl pk-yrs)   Types: Cigarettes   Start date: 11/04/1991   Quit date: 11/03/2021   Years since quitting: 2.4  Smokeless Tobacco Never   Counseling given: Not Answered   Continue to not smoke  Outpatient Encounter Medications as of 04/04/2024  Medication Sig   acetaminophen  (TYLENOL ) 500 MG tablet Take 1,000 mg by mouth every 8 (eight) hours as needed for moderate pain.   albuterol  (PROVENTIL ) (2.5 MG/3ML) 0.083% nebulizer solution Inhale 3 mLs into the lungs every 4 (four) hours as needed for shortness of breath. (Patient not taking: Reported on 09/16/2022)   aspirin  EC 325 MG tablet Take 1 tablet (325 mg total) by mouth 2 (two) times daily after a meal. Take x 1 month post op to decrease risk of blood clots. (Patient not taking: Reported on 09/16/2022)   b complex vitamins tablet Take 1 tablet by mouth daily.   chlorpheniramine-HYDROcodone (TUSSIONEX) 10-8 MG/5ML SUER Take 5 mLs by mouth every 12 (twelve) hours as needed for cough. (Patient not taking: Reported on 09/16/2022)   Cholecalciferol (DIALYVITE VITAMIN D 5000) 125 MCG (5000 UT) capsule Take 5,000 Units by mouth daily.    cyclobenzaprine  (FLEXERIL ) 10 MG tablet Take 1 tablet (10 mg total) by mouth 3 (three) times daily as needed for muscle spasms.   diclofenac Sodium (VOLTAREN) 1 % GEL Apply 4 g topically 3 (three) times daily as needed (pain).   docusate sodium  (COLACE) 100 MG capsule Take 1 capsule (100 mg total) by mouth 2 (two) times daily.   fluticasone  (FLONASE ) 50 MCG/ACT nasal spray Place 2 sprays into both nostrils daily as needed for allergies.   hydrochlorothiazide  (HYDRODIURIL ) 25 MG tablet Take 25 mg by mouth daily.   HYDROcodone-acetaminophen  (NORCO) 10-325 MG tablet Take 1 tablet by mouth every 6 (six) hours as needed for severe pain.   HYDROmorphone  (DILAUDID ) 2 MG tablet Take 1-2 tablets (2-4 mg total) by mouth every 6 (six) hours as needed for severe pain. (Patient not taking: Reported on 09/16/2022)   HYDROmorphone  (DILAUDID ) 2 MG tablet Take 1-2 tablets (2-4 mg total) by mouth every 6 (six) hours as needed for severe pain. (Patient not taking: Reported on 09/16/2022)   ibuprofen  (ADVIL ) 800 MG tablet Take 800 mg by mouth 3 times/day as needed-between meals & bedtime for moderate pain.   losartan  (COZAAR ) 100 MG tablet Take 100 mg by mouth daily.   Multiple Vitamin (MULTIVITAMIN WITH MINERALS) TABS tablet Take 1 tablet by mouth daily.   NARCAN  4 MG/0.1ML LIQD nasal spray kit Place 1 spray into the nose as needed (opioid overdose). (Patient not taking: Reported on 09/16/2022)   oxyCODONE -acetaminophen  (PERCOCET) 10-325 MG tablet Take 1 tablet by mouth every 4 (four) hours as needed for pain. (Patient not taking: Reported on 09/16/2022)   phentermine  (ADIPEX-P ) 37.5 MG tablet Take 1 tablet (37.5 mg total) by mouth daily before breakfast. (Patient not taking: Reported on 09/16/2022)   sodium chloride  (OCEAN) 0.65 % nasal spray Place 1 spray into the nose as needed for congestion. (Patient not taking: Reported on 09/16/2022)   traZODone  (DESYREL ) 100 MG tablet Take 2 tablets (200 mg total) by mouth at bedtime  as needed. for sleep   VENTOLIN  HFA 108 (90  Base) MCG/ACT inhaler Inhale 2 puffs into the lungs every 4 (four) hours as needed for wheezing or shortness of breath.    vitamin E 180 MG (400 UNITS) capsule Take 400 Units by mouth daily.   No facility-administered encounter medications on file as of 04/04/2024.     Review of Systems  Review of Systems  N/a Physical Exam  There were no vitals taken for this visit.  Wt Readings from Last 5 Encounters:  03/16/24 201 lb 1.6 oz (91.2 kg)  11/21/21 209 lb 15.8 oz (95.2 kg)  11/19/21 210 lb (95.3 kg)  11/13/21 208 lb (94.3 kg)  11/12/21 208 lb 3.2 oz (94.4 kg)    BMI Readings from Last 5 Encounters:  03/16/24 39.27 kg/m  11/21/21 39.68 kg/m  11/19/21 39.68 kg/m  11/13/21 39.30 kg/m  11/12/21 39.34 kg/m     Physical Exam General: Well-appearing, no acute distress Eyes: EOMI, no icterus Neck: Supple, no JVP appreciated Respiratory: NWOB, on RA Cardiovascular: Regular rhythm, no murmurs MSK: No synovitis, no joint effusion Neuro: Normal gait, no weakness Psych: Normal mood, full affect  Assessment & Plan:   Tobacco Abuse in remission: ongoing abstinence from cigarette smoking.  Congratulated.   Dyspnea on exertion: Mild symptoms of DOE. Can not tolerate Respimat spiriva.  No better with Anoro.  Mild symptoms.  Not using inhalers.  Okay to stay off inhalers.  Preoperative evaluation: Pulmonary medicine does not provide operative clearance but rather preoperative restratification. Based on the ARISCAT model and assuming less than 3-hour time of surgery patient is low or 1.6% risk of postoperative pulmonary complication.  If duration of surgery is greater than 3 hours then patient is intermediate or 13.3% risk of postoperative pulmonary complication.  There are no modifiable risk factors that can be identified to be addressed to decrease this risk.  Please see below for recommendations. --Provide bronchodilators nebulized prior  to surgery --Provide bronchodilators nebulized postoperatively --If able, use regional anesthesia and avoid general anesthesia, avoid neuromuscular blockade as able  No follow-ups on file.   Guerry Leek, MD 04/04/2024

## 2024-04-04 NOTE — Patient Instructions (Addendum)
 Nice to see you again  Can stay off inhalers  No reason to delay surgery  Return to clinic as needed

## 2024-04-09 NOTE — Telephone Encounter (Signed)
 Copy of the risk assessment 04/04/24 was faxed to [facility].

## 2024-04-12 NOTE — Progress Notes (Addendum)
 COVID Vaccine received:  []  No [x]  Yes Date of any COVID positive Test in last 90 days: no PCP - [facility] in [place] Cardiologist - Constancia Delton MD  Chest x-ray -  EKG - 03/16/24 Epic Stress Test -  ECHO - 09/17/20 Epic Cardiac Cath -   Cardiac clearance-03/16/24-Brian Valeta Gaudier MD  Bowel Prep - [x]  No  []   Yes ______  Pacemaker / ICD device [x]  No []  Yes   Spinal Cord Stimulator:[x]  No []  Yes       History of Sleep Apnea? [x]  No []  Yes   CPAP used?- [x]  No []  Yes    Does the patient monitor blood sugar?          [x]  No []  Yes  []  N/A  Patient has: []  NO Hx DM   [x]  Pre-DM                 []  DM1  []   DM2 Does patient have a [employer] or Dexacom? []  No []  Yes   Fasting Blood Sugar Ranges-  Checks Blood Sugar _____ times a day  GLP1 agonist / usual dose - no GLP1 instructions:  SGLT-2 inhibitors / usual dose - no SGLT-2 instructions:   Blood Thinner / Instructions:no Aspirin  Instructions:no  Comments:   Activity level: Patient is able to climb a flight of stairs without difficulty; [x]  No CP  [x]  No SOB,  Patient can  perform ADLs without assistance.   Anesthesia review: HTN, COPD, Murmur  Patient denies shortness of breath, fever, cough and chest pain at PAT appointment.  Patient verbalized understanding and agreement to the Pre-Surgical Instructions that were given to them at this PAT appointment. Patient was also educated of the need to review these PAT instructions again prior to his/her surgery.I reviewed the appropriate phone numbers to call if they have any and questions or concerns.

## 2024-04-12 NOTE — Patient Instructions (Signed)
 SURGICAL WAITING ROOM VISITATION  Patients having surgery or a procedure may have no more than 2 support people in the waiting area - these visitors may rotate.    Children under the age of 19 must have an adult with them who is not the patient.  Visitors with respiratory illnesses are discouraged from visiting and should remain at home.  If the patient needs to stay at the hospital during part of their recovery, the visitor guidelines for inpatient rooms apply. Pre-op nurse will coordinate an appropriate time for 1 support person to accompany patient in pre-op.  This support person may not rotate.    Please refer to the [facility] website for the visitor guidelines for Inpatients (after your surgery is over and you are in a regular room).       Your procedure is scheduled on: 05/03/24   Report to [facility] Main Entrance    Report to admitting at 5:15 AM   Call this number if you have problems the morning of surgery [phone]   Do not eat food :After Midnight.   After Midnight you may have the following liquids until 4:30 AM DAY OF SURGERY  Water  Non-Citrus Juices (without pulp, NO RED-Apple, White grape, White cranberry) Black Coffee (NO MILK/CREAM OR CREAMERS, sugar ok)  Clear Tea (NO MILK/CREAM OR CREAMERS, sugar ok) regular and decaf                             Plain Jell-O (NO RED)                                           Fruit ices (not with fruit pulp, NO RED)                                     Popsicles (NO RED)                                                               Sports drinks like Gatorade (NO RED)                  The day of surgery:  Drink ONE (1) Pre-Surgery G2 at4:30 AM the morning of surgery. Drink in one sitting. Do not sip.  This drink was given to you during your hospital  pre-op appointment visit. Nothing else to drink after completing the  Pre-Surgery G2.   Oral Hygiene is also important to reduce your risk of infection.                                     Remember - BRUSH YOUR TEETH THE MORNING OF SURGERY WITH YOUR REGULAR TOOTHPASTE  DENTURES WILL BE REMOVED PRIOR TO SURGERY PLEASE DO NOT APPLY "Poly grip" OR ADHESIVES!!!   Do NOT smoke after Midnight   Stop all vitamins and herbal supplements 7 days before surgery.   Take these medicines the morning of surgery with A SIP OF WATER : Tylenol  id  needed.               Do not take hydrochlorothiazide  or Losartan  the morning of surgery.              You may not have any metal on your body including hair pins, jewelry, and body piercing             Do not wear make-up, lotions, powders, perfumes/cologne, or deodorant  Do not wear nail polish including gel and S&S, artificial/acrylic nails, or any other type of covering on natural nails including finger and toenails. If you have artificial nails, gel coating, etc. that needs to be removed by a nail salon please have this removed prior to surgery or surgery may need to be canceled/ delayed if the surgeon/ anesthesia feels like they are unable to be safely monitored.   Do not shave  48 hours prior to surgery.     Do not bring valuables to the hospital. [facility] IS NOT             RESPONSIBLE   FOR VALUABLES.   Contacts, glasses, dentures or bridgework may not be worn into surgery.   DO NOT BRING YOUR HOME MEDICATIONS TO THE HOSPITAL. PHARMACY WILL DISPENSE MEDICATIONS LISTED ON YOUR MEDICATION LIST TO YOU DURING YOUR ADMISSION IN THE HOSPITAL!    Patients discharged on the day of surgery will not be allowed to drive home.  Someone NEEDS to stay with you for the first 24 hours after anesthesia.   Special Instructions: Bring a copy of your healthcare power of attorney and living will documents the day of surgery if you haven't scanned them before.              Please read over the following fact sheets you were given: IF YOU HAVE QUESTIONS ABOUT YOUR PRE-OP INSTRUCTIONS PLEASE CALL [phone] Ammon Bales    If  you received a COVID test during your pre-op visit  it is requested that you wear a mask when out in public, stay away from anyone that may not be feeling well and notify your surgeon if you develop symptoms. If you test positive for Covid or have been in contact with anyone that has tested positive in the last 10 days please notify you surgeon.      Pre-operative 5 CHG Bath Instructions   You can play a key role in reducing the risk of infection after surgery. Your skin needs to be as free of germs as possible. You can reduce the number of germs on your skin by washing with CHG (chlorhexidine  gluconate) soap before surgery. CHG is an antiseptic soap that kills germs and continues to kill germs even after washing.   DO NOT use if you have an allergy to chlorhexidine /CHG or antibacterial soaps. If your skin becomes reddened or irritated, stop using the CHG and notify one of our RNs at [phone].   Please shower with the CHG soap starting 4 days before surgery using the following schedule:     Please keep in mind the following:  DO NOT shave, including legs and underarms, starting the day of your first shower.   You may shave your face at any point before/day of surgery.  Place clean sheets on your bed the day you start using CHG soap. Use a clean washcloth (not used since being washed) for each shower. DO NOT sleep with pets once you start using the CHG.   CHG Shower Instructions:  If you choose  to wash your hair and private area, wash first with your normal shampoo/soap.  After you use shampoo/soap, rinse your hair and body thoroughly to remove shampoo/soap residue.  Turn the water  OFF and apply about 3 tablespoons (45 ml) of CHG soap to a CLEAN washcloth.  Apply CHG soap ONLY FROM YOUR NECK DOWN TO YOUR TOES (washing for 3-5 minutes)  DO NOT use CHG soap on face, private areas, open wounds, or sores.  Pay special attention to the area where your surgery is being performed.  If you are  having back surgery, having someone wash your back for you may be helpful. Wait 2 minutes after CHG soap is applied, then you may rinse off the CHG soap.  Pat dry with a clean towel  Put on clean clothes/pajamas   If you choose to wear lotion, please use ONLY the CHG-compatible lotions on the back of this paper.     Additional instructions for the day of surgery: DO NOT APPLY any lotions, deodorants, cologne, or perfumes.   Put on clean/comfortable clothes.  Brush your teeth.  Ask your nurse before applying any prescription medications to the skin.      CHG Compatible Lotions   Aveeno Moisturizing lotion  Cetaphil Moisturizing Cream  Cetaphil Moisturizing Lotion  Clairol Herbal Essence Moisturizing Lotion, Dry Skin  Clairol Herbal Essence Moisturizing Lotion, Extra Dry Skin  Clairol Herbal Essence Moisturizing Lotion, Normal Skin  Curel Age Defying Therapeutic Moisturizing Lotion with Alpha Hydroxy  Curel Extreme Care Body Lotion  Curel Soothing Hands Moisturizing Hand Lotion  Curel Therapeutic Moisturizing Cream, Fragrance-Free  Curel Therapeutic Moisturizing Lotion, Fragrance-Free  Curel Therapeutic Moisturizing Lotion, Original Formula  Eucerin Daily Replenishing Lotion  Eucerin Dry Skin Therapy Plus Alpha Hydroxy Crme  Eucerin Dry Skin Therapy Plus Alpha Hydroxy Lotion  Eucerin Original Crme  Eucerin Original Lotion  Eucerin Plus Crme Eucerin Plus Lotion  Eucerin TriLipid Replenishing Lotion  Keri Anti-Bacterial Hand Lotion  Keri Deep Conditioning Original Lotion Dry Skin Formula Softly Scented  Keri Deep Conditioning Original Lotion, Fragrance Free Sensitive Skin Formula  Keri Lotion Fast Absorbing Fragrance Free Sensitive Skin Formula  Keri Lotion Fast Absorbing Softly Scented Dry Skin Formula  Keri Original Lotion  Keri Skin Renewal Lotion Keri Silky Smooth Lotion  Keri Silky Smooth Sensitive Skin Lotion  Nivea Body Creamy Conditioning Oil  Nivea Body Extra  Enriched Lotion  Nivea Body Original Lotion  Nivea Body Sheer Moisturizing Lotion Nivea Crme  Nivea Skin Firming Lotion  NutraDerm 30 Skin Lotion  NutraDerm Skin Lotion  NutraDerm Therapeutic Skin Cream  NutraDerm Therapeutic Skin Lotion  ProShield Protective Hand Cream   Incentive Spirometer   An incentive spirometer is a tool that can help keep your lungs clear and active. This tool measures how well you are filling your lungs with each breath. Taking long deep breaths may help reverse or decrease the chance of developing breathing (pulmonary) problems (especially infection) following: A long period of time when you are unable to move or be active. BEFORE THE PROCEDURE  If the spirometer includes an indicator to show your best effort, your nurse or respiratory therapist will set it to a desired goal. If possible, sit up straight or lean slightly forward. Try not to slouch. Hold the incentive spirometer in an upright position. INSTRUCTIONS FOR USE  Sit on the edge of your bed if possible, or sit up as far as you can in bed or on a chair. Hold the incentive spirometer in an upright  position. Breathe out normally. Place the mouthpiece in your mouth and seal your lips tightly around it. Breathe in slowly and as deeply as possible, raising the piston or the ball toward the top of the column. Hold your breath for 3-5 seconds or for as long as possible. Allow the piston or ball to fall to the bottom of the column. Remove the mouthpiece from your mouth and breathe out normally. Rest for a few seconds and repeat Steps 1 through 7 at least 10 times every 1-2 hours when you are awake. Take your time and take a few normal breaths between deep breaths. The spirometer may include an indicator to show your best effort. Use the indicator as a goal to work toward during each repetition. After each set of 10 deep breaths, practice coughing to be sure your lungs are clear. If you have an incision (the  cut made at the time of surgery), support your incision when coughing by placing a pillow or rolled up towels firmly against it. Once you are able to get out of bed, walk around indoors and cough well. You may stop using the incentive spirometer when instructed by your caregiver.  RISKS AND COMPLICATIONS Take your time so you do not get dizzy or light-headed. If you are in pain, you may need to take or ask for pain medication before doing incentive spirometry. It is harder to take a deep breath if you are having pain. AFTER USE Rest and breathe slowly and easily. It can be helpful to keep track of a log of your progress. Your caregiver can provide you with a simple table to help with this. If you are using the spirometer at home, follow these instructions: SEEK MEDICAL CARE IF:  You are having difficultly using the spirometer. You have trouble using the spirometer as often as instructed. Your pain medication is not giving enough relief while using the spirometer. You develop fever of 100.5 F (38.1 C) or higher. SEEK IMMEDIATE MEDICAL CARE IF:  You cough up bloody sputum that had not been present before. You develop fever of 102 F (38.9 C) or greater. You develop worsening pain at or near the incision site. MAKE SURE YOU:  Understand these instructions. Will watch your condition. Will get help right away if you are not doing well or get worse. Document Released: 03/07/2007 Document Revised: 01/17/2012 Document Reviewed: 05/08/2007 [facility] Patient Information 2014 [place], [place]- Preparing for Total Shoulder Arthroplasty    Before surgery, you can play an important role. Because skin is not sterile, your skin needs to be as free of germs as possible. You can reduce the number of germs on your skin by using the following products. Benzoyl Peroxide Gel Reduces the number of germs present on the skin Applied twice a day to shoulder area starting two days before surgery     ==================================================================  Please follow these instructions carefully:  BENZOYL PEROXIDE 5% GEL  Please do not use if you have an allergy to benzoyl peroxide.   If your skin becomes reddened/irritated stop using the benzoyl peroxide.  Starting two days before surgery, apply as follows: Apply benzoyl peroxide in the morning and at night. Apply after taking a shower. If you are not taking a shower clean entire shoulder front, back, and side along with the armpit with a clean wet washcloth.  Place a quarter-sized dollop on your shoulder and rub in thoroughly, making sure to cover the front, back, and side of your shoulder, along with  the armpit.   2 days before ____ AM   ____ PM              1 day before ____ AM   ____ PM                         Do this twice a day for two days.  (Last application is the night before surgery, AFTER using the CHG soap as described below).  Do NOT apply benzoyl peroxide gel on the day of surgery.

## 2024-04-16 ENCOUNTER — Encounter (HOSPITAL_COMMUNITY)
Admission: RE | Admit: 2024-04-16 | Discharge: 2024-04-16 | Disposition: A | Discharge status: Home / Self Care | Source: Ambulatory Visit | Attending: Orthopedic Surgery | Admitting: Orthopedic Surgery

## 2024-04-16 ENCOUNTER — Encounter (HOSPITAL_COMMUNITY): Payer: Self-pay

## 2024-04-16 ENCOUNTER — Other Ambulatory Visit: Payer: Self-pay | Admitting: Orthopedic Surgery

## 2024-04-16 ENCOUNTER — Other Ambulatory Visit: Payer: Self-pay

## 2024-04-16 ENCOUNTER — Ambulatory Visit (HOSPITAL_COMMUNITY)
Admission: RE | Admit: 2024-04-16 | Discharge: 2024-04-16 | Disposition: A | Discharge status: Home / Self Care | Source: Ambulatory Visit | Attending: Orthopedic Surgery | Admitting: Orthopedic Surgery

## 2024-04-16 VITALS — BP 139/91 | HR 112 | Temp 98.2°F | Resp 20 | Ht 60.0 in | Wt 201.0 lb

## 2024-04-16 DIAGNOSIS — I10 Essential (primary) hypertension: Secondary | ICD-10-CM | POA: Diagnosis not present

## 2024-04-16 DIAGNOSIS — Z01812 Encounter for preprocedural laboratory examination: Secondary | ICD-10-CM | POA: Diagnosis present

## 2024-04-16 DIAGNOSIS — Z01818 Encounter for other preprocedural examination: Secondary | ICD-10-CM

## 2024-04-16 LAB — BASIC METABOLIC PANEL WITH GFR
Anion gap: 11 (ref 5–15)
BUN: 16 mg/dL (ref 6–20)
CO2: 25 mmol/L (ref 22–32)
Calcium: 9.3 mg/dL (ref 8.9–10.3)
Chloride: 102 mmol/L (ref 98–111)
Creatinine, Ser: 0.67 mg/dL (ref 0.44–1.00)
GFR, Estimated: >60 mL/min (ref >60)
Glucose, Bld: 79 mg/dL (ref 70–99)
Potassium: 3.9 mmol/L (ref 3.5–5.1)
Sodium: 138 mmol/L (ref 135–145)

## 2024-04-16 LAB — CBC
HCT: 42.4 % (ref 36.0–46.0)
Hemoglobin: 13.7 g/dL (ref 12.0–15.0)
MCH: 31.4 pg (ref 26.0–34.0)
MCHC: 32.3 g/dL (ref 30.0–36.0)
MCV: 97.2 fL (ref 80.0–100.0)
Platelets: 407 10*3/uL — ABNORMAL HIGH (ref 150–400)
RBC: 4.36 MIL/uL (ref 3.87–5.11)
RDW: 12.7 % (ref 11.5–15.5)
WBC: 10.2 10*3/uL (ref 4.0–10.5)
nRBC: 0 % (ref 0.0–0.2)

## 2024-04-17 NOTE — Progress Notes (Signed)
 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 smoker with h/o HTN, COPD, right shoulder OA scheduled for above procedure 05/03/24 with Dr. Sammye Cristal.   Pt seen by pulmonology 04/04/2024. Per OV note, "Preoperative evaluation: Pulmonary medicine does not provide operative clearance but rather preoperative restratification. Based on the ARISCAT model and assuming less than 3-hour time of surgery patient is low or 1.6% risk of postoperative pulmonary complication.  If duration of surgery is greater than 3 hours then patient is intermediate or 13.3% risk of postoperative pulmonary complication.  There are no modifiable risk factors that can be identified to be addressed to decrease this risk.  Please see below for recommendations. --Provide bronchodilators nebulized prior to surgery --Provide bronchodilators nebulized postoperatively --If able, use regional anesthesia and avoid general anesthesia, avoid neuromuscular blockade as able"  Per cardiology preoperative evaluation 03/16/2024, " Preprocedural cardiovascular exam.  Right shoulder arthroplasty being planned.  Patient denies chest pain or shortness of breath.  Previous echo with normal EF.  Okay for procedure from a cardiac perspective, no additional cardiac testing indicated at this time. "  VS: There were no vitals taken for this visit.  PROVIDERS: [facility]  [facility] Cardiologist:  Constancia Delton, MD  LABS: Labs reviewed: Acceptable for surgery. (all labs ordered are listed, but only abnormal results are  displayed)  Labs Reviewed - No data to display   IMAGES:   EKG:   CV: Echo 09/17/2020  1. Left ventricular ejection fraction, by estimation, is 60 to 65%. The  left ventricle has normal function. The left ventricle has no regional  wall motion abnormalities. Left ventricular diastolic parameters are  indeterminate.   2. Right ventricular systolic function is normal. The right ventricular  size is normal. There is mildly elevated pulmonary artery systolic  pressure.   3. The mitral valve is normal in structure. Mild mitral valve  regurgitation. No evidence of mitral stenosis.   4. The aortic valve is tricuspid. Aortic valve regurgitation is not  visualized. Mild aortic valve sclerosis is present, with no evidence of  aortic valve stenosis.   5. The inferior vena cava is normal in size with greater than 50%  respiratory variability, suggesting right atrial pressure of 3 mmHg.  Past Medical History:  Diagnosis Date   Arthritis    COPD (chronic obstructive pulmonary disease) (HCC)    denies   COVID-19 10/23/2019   Asymptomatic   Environmental and seasonal allergies    Falls    Fibromyalgia    History of colon polyps 2017   Hypertension    Pneumonia    aspiration PNA   Wound infection    right foot    Past Surgical History:  Procedure Laterality Date   APPLICATION OF WOUND VAC Right 05/20/2020   Procedure: APPLICATION OF WOUND VAC TO RIGHT FOOT;  Surgeon: Donnamarie Gables, MD;  Location: [facility] OR;  Service: Orthopedics;  Laterality: Right;   COLONOSCOPY WITH PROPOFOL  N/A 09/03/2016   Procedure: COLONOSCOPY WITH PROPOFOL ;  Surgeon: Luke Salaam, MD;  Location: [facility] ENDOSCOPY;  Service: Endoscopy;  Laterality: N/A;   FRACTURE SURGERY     left foot    INCISION AND DRAINAGE Right 05/20/2020   Procedure: RIGHT FOOT INCISION AND DRAINAGE;  Surgeon: Donnamarie Gables, MD;  Location: [facility] OR;  Service: Orthopedics;  Laterality: Right;  LENGTH OF SURGERY: 1 HOUR   KNEE  CARTILAGE SURGERY  2015   left   LAPAROSCOPIC SALPINGOOPHERECTOMY     left hand     1 for ganglion cyst and next yr replace "joints" in left hand   PLANTAR FASCIA SURGERY     left foot   SHOULDER INJECTION Right 05/17/2016   Procedure: SHOULDER INJECTION;  Surgeon: Neil Balls, MD;  Location: [facility] OR;  Service: Orthopedics;  Laterality: Right;   SHOULDER SURGERY     x 2 on right shoulder   TONSILLECTOMY     TOTAL HIP ARTHROPLASTY Left 11/20/2021   Procedure: TOTAL HIP ARTHROPLASTY ANTERIOR APPROACH;  Surgeon: Neil Balls, MD;  Location[facility];  Service: Orthopedics;  Laterality: Left;   TOTAL KNEE ARTHROPLASTY Left 05/17/2016   Procedure: TOTAL KNEE ARTHROPLASTY;  Surgeon: Neil Balls, MD;  Location: [facility];  Service: Orthopedics;  Laterality: Left;   TOTAL KNEE ARTHROPLASTY Right 11/23/2019   Procedure: TOTAL KNEE ARTHROPLASTY;  Surgeon: Neil Balls, MD;  Location[facility];  Service: Orthopedics;  Laterality: Right;   TOTAL SHOULDER ARTHROPLASTY Left 05/09/2015   Procedure: TOTAL SHOULDER ARTHROPLASTY;  Surgeon: Neil Balls, MD;  Location: [facility];  Service: Orthopedics;  Laterality: Left;   TUBAL LIGATION     tubes removed stated patient    MEDICATIONS: No current facility-administered medications for this encounter.    acetaminophen  (TYLENOL ) 500 MG tablet   b complex vitamins tablet   Cholecalciferol (DIALYVITE VITAMIN D 5000) 125 MCG (5000 UT) capsule   cyclobenzaprine  (FLEXERIL ) 10 MG tablet   hydrochlorothiazide  (HYDRODIURIL ) 25 MG tablet   ibuprofen  (ADVIL ) 800 MG tablet   losartan  (COZAAR ) 100 MG tablet   vitamin E 180 MG (400 UNITS) capsule    Chick Cotton Ward, PA-C [facility]Surgical Testing [phone]

## 2024-04-17 NOTE — Anesthesia Preprocedure Evaluation (Addendum)
 Anesthesia Evaluation  Patient identified by MRN, date of birth, ID band Patient awake    Reviewed: Allergy & Precautions, NPO status , Patient's Chart, lab work & pertinent test results  History of Anesthesia Complications Negative for: history of anesthetic complications  Airway Mallampati: I  TM Distance: >3 FB Neck ROM: Full    Dental  (+) Edentulous Upper, Edentulous Lower   Pulmonary COPD, Current Smoker and Patient abstained from smoking.   breath sounds clear to auscultation       Cardiovascular hypertension, Pt. on medications (-) angina  Rhythm:Regular Rate:Normal  '21 ECHO: EF 60-65%, normal LVF, normal RVF, mild MR   Neuro/Psych negative neurological ROS     GI/Hepatic negative GI ROS, Neg liver ROS,,,  Endo/Other  BMI 39  Renal/GU negative Renal ROS     Musculoskeletal  (+) Arthritis ,  Fibromyalgia -  Abdominal   Peds  Hematology negative hematology ROS (+)   Anesthesia Other Findings   Reproductive/Obstetrics                             Anesthesia Physical Anesthesia Plan  ASA: 3  Anesthesia Plan: General   Post-op Pain Management: Tylenol  PO (pre-op)* and Regional block*   Induction:   PONV Risk Score and Plan: 2 and Dexamethasone  and Ondansetron   Airway Management Planned: Oral ETT  Additional Equipment: None  Intra-op Plan:   Post-operative Plan: Extubation in OR  Informed Consent: I have reviewed the patients History and Physical, chart, labs and discussed the procedure including the risks, benefits and alternatives for the proposed anesthesia with the patient or authorized representative who has indicated his/her understanding and acceptance.     Dental advisory given  Plan Discussed with: CRNA and Surgeon  Anesthesia Plan Comments: (See PAT note 04/17/2024 Plan routine monitors, GETA with interscalene block for post op analgesia)        Anesthesia Quick Evaluation

## 2024-05-03 ENCOUNTER — Ambulatory Visit (HOSPITAL_COMMUNITY): Payer: Self-pay | Admitting: Physician Assistant

## 2024-05-03 ENCOUNTER — Other Ambulatory Visit: Payer: Self-pay

## 2024-05-03 ENCOUNTER — Encounter (HOSPITAL_COMMUNITY)
Admission: RE | Disposition: A | Discharge status: Home / Self Care | Payer: Self-pay | Source: Home / Self Care | Attending: Orthopedic Surgery

## 2024-05-03 ENCOUNTER — Encounter (HOSPITAL_COMMUNITY): Payer: Self-pay | Admitting: Orthopedic Surgery

## 2024-05-03 ENCOUNTER — Ambulatory Visit (HOSPITAL_COMMUNITY)
Admission: RE | Admit: 2024-05-03 | Discharge: 2024-05-03 | Disposition: A | Discharge status: Home / Self Care | Attending: Orthopedic Surgery | Admitting: Orthopedic Surgery

## 2024-05-03 DIAGNOSIS — M797 Fibromyalgia: Secondary | ICD-10-CM | POA: Diagnosis not present

## 2024-05-03 DIAGNOSIS — F17210 Nicotine dependence, cigarettes, uncomplicated: Secondary | ICD-10-CM | POA: Diagnosis not present

## 2024-05-03 DIAGNOSIS — M19011 Primary osteoarthritis, right shoulder: Secondary | ICD-10-CM | POA: Insufficient documentation

## 2024-05-03 DIAGNOSIS — Z96653 Presence of artificial knee joint, bilateral: Secondary | ICD-10-CM | POA: Insufficient documentation

## 2024-05-03 DIAGNOSIS — Z8739 Personal history of other diseases of the musculoskeletal system and connective tissue: Secondary | ICD-10-CM | POA: Diagnosis present

## 2024-05-03 DIAGNOSIS — J449 Chronic obstructive pulmonary disease, unspecified: Secondary | ICD-10-CM | POA: Insufficient documentation

## 2024-05-03 DIAGNOSIS — I10 Essential (primary) hypertension: Secondary | ICD-10-CM | POA: Insufficient documentation

## 2024-05-03 DIAGNOSIS — M75101 Unspecified rotator cuff tear or rupture of right shoulder, not specified as traumatic: Secondary | ICD-10-CM | POA: Diagnosis not present

## 2024-05-03 HISTORY — PX: REVERSE SHOULDER ARTHROPLASTY: SHX5054

## 2024-05-03 SURGERY — ARTHROPLASTY, SHOULDER, TOTAL, REVERSE
Anesthesia: General | Site: Shoulder | Laterality: Right

## 2024-05-03 MED ORDER — PHENYLEPHRINE HCL-NACL 20-0.9 MG/250ML-% IV SOLN
INTRAVENOUS | Status: DC | PRN
  Administered 2024-05-03: 25 ug/min via INTRAVENOUS

## 2024-05-03 MED ORDER — OXYCODONE HCL 5 MG PO TABS: 5.0000 mg | ORAL_TABLET | ORAL | 0 refills | Status: DC | PRN

## 2024-05-03 MED ORDER — CYCLOBENZAPRINE HCL 10 MG PO TABS: 10.0000 mg | ORAL_TABLET | Freq: Three times a day (TID) | ORAL | 0 refills | Status: AC | PRN

## 2024-05-03 MED ORDER — MIDAZOLAM HCL 2 MG/2ML IJ SOLN: 2.0000 mg | INTRAMUSCULAR | Status: DC

## 2024-05-03 MED ORDER — MEPERIDINE HCL 50 MG/ML IJ SOLN: 6.2500 mg | INTRAMUSCULAR | Status: DC | PRN

## 2024-05-03 MED ORDER — POVIDONE-IODINE 10 % EX SWAB: 2.0000 | Freq: Once | CUTANEOUS | Status: DC

## 2024-05-03 MED ORDER — SUGAMMADEX SODIUM 200 MG/2ML IV SOLN
INTRAVENOUS | Status: DC | PRN
Start: 2024-05-03 — End: 2024-05-03
  Administered 2024-05-03 (×2): 100 mg via INTRAVENOUS

## 2024-05-03 MED ORDER — LIDOCAINE HCL (CARDIAC) PF 100 MG/5ML IV SOSY
PREFILLED_SYRINGE | INTRAVENOUS | Status: DC | PRN
  Administered 2024-05-03: 40 mg via INTRAVENOUS

## 2024-05-03 MED ORDER — DEXAMETHASONE SODIUM PHOSPHATE 10 MG/ML IJ SOLN
INTRAMUSCULAR | Status: AC
  Filled 2024-05-03: qty 1

## 2024-05-03 MED ORDER — PHENYLEPHRINE 80 MCG/ML (10ML) SYRINGE FOR IV PUSH (FOR BLOOD PRESSURE SUPPORT)
PREFILLED_SYRINGE | INTRAVENOUS | Status: AC
  Filled 2024-05-03: qty 10

## 2024-05-03 MED ORDER — 0.9 % SODIUM CHLORIDE (POUR BTL) OPTIME
TOPICAL | Status: DC | PRN
Start: 2024-05-03 — End: 2024-05-03
  Administered 2024-05-03: 1000 mL

## 2024-05-03 MED ORDER — ONDANSETRON HCL 4 MG/2ML IJ SOLN
INTRAMUSCULAR | Status: DC | PRN
  Administered 2024-05-03: 4 mg via INTRAVENOUS

## 2024-05-03 MED ORDER — PROPOFOL 10 MG/ML IV BOLUS
INTRAVENOUS | Status: AC
  Filled 2024-05-03: qty 20

## 2024-05-03 MED ORDER — ONDANSETRON HCL 4 MG/2ML IJ SOLN
INTRAMUSCULAR | Status: AC
Start: 2024-05-03 — End: 2024-05-03
  Filled 2024-05-03: qty 2

## 2024-05-03 MED ORDER — SODIUM CHLORIDE 0.9 % IR SOLN
Status: DC | PRN
  Administered 2024-05-03: 1000 mL

## 2024-05-03 MED ORDER — ROCURONIUM BROMIDE 100 MG/10ML IV SOLN
INTRAVENOUS | Status: DC | PRN
  Administered 2024-05-03: 60 mg via INTRAVENOUS

## 2024-05-03 MED ORDER — METHOCARBAMOL 500 MG PO TABS
ORAL_TABLET | ORAL | Status: AC
  Filled 2024-05-03: qty 1

## 2024-05-03 MED ORDER — WATER FOR IRRIGATION, STERILE IR SOLN
Status: DC | PRN
  Administered 2024-05-03: 1000 mL

## 2024-05-03 MED ORDER — FENTANYL CITRATE PF 50 MCG/ML IJ SOSY
PREFILLED_SYRINGE | INTRAMUSCULAR | Status: AC
  Administered 2024-05-03: 50 ug via INTRAVENOUS
  Filled 2024-05-03: qty 2

## 2024-05-03 MED ORDER — PHENYLEPHRINE 80 MCG/ML (10ML) SYRINGE FOR IV PUSH (FOR BLOOD PRESSURE SUPPORT)
PREFILLED_SYRINGE | INTRAVENOUS | Status: DC | PRN
  Administered 2024-05-03 (×2): 160 ug via INTRAVENOUS

## 2024-05-03 MED ORDER — ORAL CARE MOUTH RINSE: 15.0000 mL | Freq: Once | OROMUCOSAL | Status: AC

## 2024-05-03 MED ORDER — VANCOMYCIN HCL IN DEXTROSE 1-5 GM/200ML-% IV SOLN
1000.0000 mg | INTRAVENOUS | Status: AC
  Administered 2024-05-03: 1000 mg via INTRAVENOUS
  Filled 2024-05-03: qty 200

## 2024-05-03 MED ORDER — HYDROMORPHONE HCL 1 MG/ML IJ SOLN
0.2500 mg | INTRAMUSCULAR | Status: DC | PRN
  Administered 2024-05-03: 0.5 mg via INTRAVENOUS
  Administered 2024-05-03 (×2): 0.25 mg via INTRAVENOUS

## 2024-05-03 MED ORDER — LACTATED RINGERS IV SOLN: INTRAVENOUS | Status: DC

## 2024-05-03 MED ORDER — OXYCODONE HCL 5 MG PO TABS
5.0000 mg | ORAL_TABLET | Freq: Once | ORAL | Status: AC | PRN
  Administered 2024-05-03: 5 mg via ORAL

## 2024-05-03 MED ORDER — EPHEDRINE 5 MG/ML INJ
INTRAVENOUS | Status: AC
  Filled 2024-05-03: qty 5

## 2024-05-03 MED ORDER — MIDAZOLAM HCL 2 MG/2ML IJ SOLN
INTRAMUSCULAR | Status: AC
  Administered 2024-05-03: 1 mg via INTRAVENOUS
  Filled 2024-05-03: qty 2

## 2024-05-03 MED ORDER — IPRATROPIUM-ALBUTEROL 0.5-2.5 (3) MG/3ML IN SOLN
3.0000 mL | RESPIRATORY_TRACT | Status: DC
  Administered 2024-05-03: 3 mL via RESPIRATORY_TRACT

## 2024-05-03 MED ORDER — METHOCARBAMOL 500 MG PO TABS
500.0000 mg | ORAL_TABLET | Freq: Four times a day (QID) | ORAL | Status: DC | PRN
  Administered 2024-05-03: 500 mg via ORAL

## 2024-05-03 MED ORDER — HYDROMORPHONE HCL 1 MG/ML IJ SOLN
INTRAMUSCULAR | Status: AC
Start: 2024-05-03 — End: 2024-05-03
  Filled 2024-05-03: qty 1

## 2024-05-03 MED ORDER — FENTANYL CITRATE PF 50 MCG/ML IJ SOSY: 100.0000 ug | PREFILLED_SYRINGE | INTRAMUSCULAR | Status: DC

## 2024-05-03 MED ORDER — CEFAZOLIN SODIUM-DEXTROSE 2-4 GM/100ML-% IV SOLN
2.0000 g | INTRAVENOUS | Status: AC
  Administered 2024-05-03: 2 g via INTRAVENOUS
  Filled 2024-05-03: qty 100

## 2024-05-03 MED ORDER — BUPIVACAINE LIPOSOME 1.3 % IJ SUSP
10.0000 mL | Freq: Once | INTRAMUSCULAR | Status: AC
  Administered 2024-05-03: 10 mL

## 2024-05-03 MED ORDER — FENTANYL CITRATE (PF) 100 MCG/2ML IJ SOLN
INTRAMUSCULAR | Status: AC
  Filled 2024-05-03: qty 2

## 2024-05-03 MED ORDER — OXYCODONE HCL 5 MG/5ML PO SOLN: 5.0000 mg | Freq: Once | ORAL | Status: AC | PRN

## 2024-05-03 MED ORDER — METHOCARBAMOL 1000 MG/10ML IJ SOLN: 500.0000 mg | Freq: Four times a day (QID) | INTRAMUSCULAR | Status: DC | PRN

## 2024-05-03 MED ORDER — MIDAZOLAM HCL 2 MG/2ML IJ SOLN: 0.5000 mg | Freq: Once | INTRAMUSCULAR | Status: DC | PRN

## 2024-05-03 MED ORDER — ACETAMINOPHEN 500 MG PO TABS
1000.0000 mg | ORAL_TABLET | Freq: Once | ORAL | Status: AC
  Administered 2024-05-03: 1000 mg via ORAL
  Filled 2024-05-03: qty 2

## 2024-05-03 MED ORDER — BUPIVACAINE-EPINEPHRINE (PF) 0.5% -1:200000 IJ SOLN
INTRAMUSCULAR | Status: DC | PRN
  Administered 2024-05-03: 10 mL via PERINEURAL

## 2024-05-03 MED ORDER — ROCURONIUM BROMIDE 10 MG/ML (PF) SYRINGE
PREFILLED_SYRINGE | INTRAVENOUS | Status: AC
  Filled 2024-05-03: qty 10

## 2024-05-03 MED ORDER — MIDAZOLAM HCL 2 MG/2ML IJ SOLN
INTRAMUSCULAR | Status: AC
  Filled 2024-05-03: qty 2

## 2024-05-03 MED ORDER — ALBUTEROL SULFATE HFA 108 (90 BASE) MCG/ACT IN AERS
INHALATION_SPRAY | RESPIRATORY_TRACT | Status: DC | PRN
  Administered 2024-05-03: 5 via RESPIRATORY_TRACT

## 2024-05-03 MED ORDER — IPRATROPIUM-ALBUTEROL 0.5-2.5 (3) MG/3ML IN SOLN
RESPIRATORY_TRACT | Status: AC
Start: 2024-05-03 — End: 2024-05-03
  Filled 2024-05-03: qty 3

## 2024-05-03 MED ORDER — PROPOFOL 10 MG/ML IV BOLUS
INTRAVENOUS | Status: DC | PRN
  Administered 2024-05-03: 100 mg via INTRAVENOUS

## 2024-05-03 MED ORDER — CHLORHEXIDINE GLUCONATE 0.12 % MT SOLN
15.0000 mL | Freq: Once | OROMUCOSAL | Status: AC
  Administered 2024-05-03: 15 mL via OROMUCOSAL

## 2024-05-03 MED ORDER — OXYCODONE HCL 5 MG PO TABS
ORAL_TABLET | ORAL | Status: AC
  Filled 2024-05-03: qty 1

## 2024-05-03 MED ORDER — LIDOCAINE HCL (PF) 2 % IJ SOLN
INTRAMUSCULAR | Status: AC
Start: 2024-05-03 — End: 2024-05-03
  Filled 2024-05-03: qty 5

## 2024-05-03 MED ORDER — TRANEXAMIC ACID-NACL 1000-0.7 MG/100ML-% IV SOLN
1000.0000 mg | INTRAVENOUS | Status: AC
  Administered 2024-05-03: 1000 mg via INTRAVENOUS
  Filled 2024-05-03: qty 100

## 2024-05-03 MED ORDER — DEXAMETHASONE SODIUM PHOSPHATE 10 MG/ML IJ SOLN
INTRAMUSCULAR | Status: DC | PRN
Start: 2024-05-03 — End: 2024-05-03
  Administered 2024-05-03: 10 mg via INTRAVENOUS

## 2024-05-03 MED ORDER — ALBUTEROL SULFATE HFA 108 (90 BASE) MCG/ACT IN AERS
INHALATION_SPRAY | RESPIRATORY_TRACT | Status: AC
  Filled 2024-05-03: qty 6.7

## 2024-05-03 SURGICAL SUPPLY — 64 items
BAG COUNTER SPONGE SURGICOUNT (BAG) IMPLANT
BAG ZIPLOCK 12X15 (MISCELLANEOUS) ×1 IMPLANT
BASEPLATE P2 COATD GLND 6.5X30 (Shoulder) IMPLANT
BIT DRILL 2.5 DIA 127 CALI (BIT) IMPLANT
BIT DRILL 4 DIA CALIBRATED (BIT) IMPLANT
BLADE SAW SGTL 73X25 THK (BLADE) ×1 IMPLANT
BOOTIES KNEE HIGH SLOAN (MISCELLANEOUS) ×2 IMPLANT
COOLER ICEMAN CLASSIC (MISCELLANEOUS) ×1 IMPLANT
COVER BACK TABLE 60X90IN (DRAPES) ×1 IMPLANT
COVER SURGICAL LIGHT HANDLE (MISCELLANEOUS) ×1 IMPLANT
DRAPE INCISE IOBAN 66X45 STRL (DRAPES) ×1 IMPLANT
DRAPE POUCH INSTRU U-SHP 10X18 (DRAPES) ×1 IMPLANT
DRAPE SHEET LG 3/4 BI-LAMINATE (DRAPES) ×1 IMPLANT
DRAPE SURG 17X11 SM STRL (DRAPES) ×1 IMPLANT
DRAPE SURG ORHT 6 SPLT 77X108 (DRAPES) ×2 IMPLANT
DRAPE TOP 10253 STERILE (DRAPES) ×1 IMPLANT
DRAPE U-SHAPE 47X51 STRL (DRAPES) ×1 IMPLANT
DRSG AQUACEL AG ADV 3.5X 6 (GAUZE/BANDAGES/DRESSINGS) ×1 IMPLANT
DURAPREP 26ML APPLICATOR (WOUND CARE) ×2 IMPLANT
ELECT BLADE TIP CTD 4 INCH (ELECTRODE) ×1 IMPLANT
ELECT PENCIL ROCKER SW 15FT (MISCELLANEOUS) ×1 IMPLANT
ELECT REM PT RETURN 15FT ADLT (MISCELLANEOUS) ×1 IMPLANT
FACESHIELD WRAPAROUND (MASK) ×1 IMPLANT
FACESHIELD WRAPAROUND OR TEAM (MASK) ×1 IMPLANT
GLOVE BIO SURGEON STRL SZ7.5 (GLOVE) ×1 IMPLANT
GLOVE BIOGEL PI IND STRL 6.5 (GLOVE) ×1 IMPLANT
GLOVE BIOGEL PI IND STRL 8 (GLOVE) ×1 IMPLANT
GLOVE SURG SS PI 6.5 STRL IVOR (GLOVE) ×1 IMPLANT
GOWN STRL REUS W/ TWL LRG LVL3 (GOWN DISPOSABLE) ×1 IMPLANT
GOWN STRL REUS W/ TWL XL LVL3 (GOWN DISPOSABLE) ×1 IMPLANT
HOOD PEEL AWAY T7 (MISCELLANEOUS) ×3 IMPLANT
INSERT SMALL SOCKET 32MM (Insert) IMPLANT
KIT BASIN OR (CUSTOM PROCEDURE TRAY) ×1 IMPLANT
KIT TURNOVER KIT A (KITS) ×1 IMPLANT
MANIFOLD NEPTUNE II (INSTRUMENTS) ×1 IMPLANT
NDL TROCAR POINT SZ 2 1/2 (NEEDLE) IMPLANT
NEEDLE TROCAR POINT SZ 2 1/2 (NEEDLE) IMPLANT
NS IRRIG 1000ML POUR BTL (IV SOLUTION) ×1 IMPLANT
PACK SHOULDER (CUSTOM PROCEDURE TRAY) ×1 IMPLANT
PAD COLD SHLDR WRAP-ON (PAD) ×1 IMPLANT
PAD ORTHO SHOULDER 7X19 LRG (SOFTGOODS) IMPLANT
RESTRAINT HEAD UNIVERSAL NS (MISCELLANEOUS) ×1 IMPLANT
RETRIEVER SUT HEWSON (MISCELLANEOUS) IMPLANT
SCREW BONE RSP LOCK 5X14 (Screw) IMPLANT
SCREW BONE RSP LOCK 5X26 (Screw) IMPLANT
SCREW BONE RSP LOCK 5X30 (Screw) IMPLANT
SCREW RETAIN W/HEAD 4MM OFFSET (Shoulder) IMPLANT
SET HNDPC FAN SPRY TIP SCT (DISPOSABLE) ×1 IMPLANT
SLING ARM FOAM STRAP LRG (SOFTGOODS) IMPLANT
SLING ARM FOAM STRAP MED (SOFTGOODS) IMPLANT
STEM HUMERAL SM SHELL 12X48 (Stem) IMPLANT
STRIP CLOSURE SKIN 1/2X4 (GAUZE/BANDAGES/DRESSINGS) ×1 IMPLANT
SUCTION TUBE FRAZIER 10FR DISP (SUCTIONS) IMPLANT
SUPPORT WRAP ARM LG (MISCELLANEOUS) ×1 IMPLANT
SUT ETHIBOND 2 V 37 (SUTURE) IMPLANT
SUT MNCRL AB 4-0 PS2 18 (SUTURE) ×1 IMPLANT
SUT VIC AB 2-0 CT1 TAPERPNT 27 (SUTURE) ×2 IMPLANT
SUTURE FIBERWR#2 38 REV NDL BL (SUTURE) IMPLANT
TAP SURG THRD DJ 6.5 (ORTHOPEDIC DISPOSABLE SUPPLIES) IMPLANT
TAPE LABRALWHITE 1.5X36 (TAPE) IMPLANT
TAPE SUT LABRALTAP WHT/BLK (SUTURE) IMPLANT
TOWEL OR 17X26 10 PK STRL BLUE (TOWEL DISPOSABLE) ×1 IMPLANT
TUBE SUCTION HIGH CAP CLEAR NV (SUCTIONS) ×1 IMPLANT
WATER STERILE IRR 1000ML POUR (IV SOLUTION) ×1 IMPLANT

## 2024-05-03 NOTE — Anesthesia Postprocedure Evaluation (Signed)
 Anesthesia Post Note  Patient: Shakiyah K Moskal  Procedure(s) Performed: ARTHROPLASTY, SHOULDER, TOTAL, REVERSE (Right: Shoulder)     Patient location during evaluation: Phase II Anesthesia Type: General Level of consciousness: awake and alert, oriented and patient cooperative Pain management: pain level controlled Vital Signs Assessment: post-procedure vital signs reviewed and stable Respiratory status: spontaneous breathing, nonlabored ventilation and respiratory function stable Cardiovascular status: blood pressure returned to baseline and stable Postop Assessment: able to ambulate, no apparent nausea or vomiting and adequate PO intake Anesthetic complications: no Comments: Pt required neb, pulm toilet exercises in PACU. Stable on discharge  No notable events documented.  Last Vitals:  Vitals:   05/03/24 1130 05/03/24 1141  BP: 95/83 118/67  Pulse: (!) 102 (!) 105  Resp: 19   Temp:    SpO2: 90% 92%    Last Pain:  Vitals:   05/03/24 1141  TempSrc:   PainSc: 0-No pain                 Konnie Noffsinger,E. Jamyia Fortune

## 2024-05-03 NOTE — Anesthesia Procedure Notes (Signed)
 Anesthesia Regional Block: Interscalene brachial plexus block   Pre-Anesthetic Checklist: , timeout performed,  Correct Patient, Correct Site, Correct Laterality,  Correct Procedure, Correct Position, site marked,  Risks and benefits discussed,  Surgical consent,  Pre-op evaluation,  At surgeon's request and post-op pain management  Laterality: Right and Upper  Prep: chloraprep       Needles:  Injection technique: Single-shot  Needle Type: Echogenic Needle     Needle Length: 9cm  Needle Gauge: 21     Additional Needles:   Procedures:,,,, ultrasound used (permanent image in chart),,    Narrative:  Start time: 05/03/2024 6:55 AM End time: 05/03/2024 7:01 AM Injection made incrementally with aspirations every 5 mL.  Performed by: Personally  Anesthesiologist: Leonce Athens, MD  Additional Notes: Pt identified in Holding room.  Monitors applied. Working IV access confirmed. Timeout, Sterile prep R clavicle and neck.  #21ga ECHOgenic Arrow block needle to interscalene brachial plexus with US  guidance.  10cc 0.5% Bupivacaine  1:200k epi, Exparel  injected incrementally after negative test dose.  Patient asymptomatic, VSS, no heme aspirated, tolerated well.   JAYSON Leonce, MD

## 2024-05-03 NOTE — Transfer of Care (Signed)
 Immediate Anesthesia Transfer of Care Note  Patient: [name]  Procedure(s) Performed: ARTHROPLASTY, SHOULDER, TOTAL, REVERSE (Right: Shoulder)  Patient Location: PACU  Anesthesia Type:General and Regional  Level of Consciousness: awake, alert , oriented, and patient cooperative  Airway & Oxygen Therapy: Patient Spontanous Breathing and Patient connected to face mask oxygen  Post-op Assessment: Report given to RN and Post -op Vital signs reviewed and stable  Post vital signs: Reviewed and stable  Last Vitals:  Vitals Value Taken Time  BP 119/65 05/03/24 09:00  Temp 97   Pulse 100 05/03/24 09:01  Resp 28 05/03/24 09:01  SpO2 91 % 05/03/24 09:01  Vitals shown include unfiled device data.  Last Pain:  Vitals:   05/03/24 0557  TempSrc: Oral  PainSc:          Complications: No notable events documented.

## 2024-05-03 NOTE — Anesthesia Procedure Notes (Signed)
 Procedure Name: Intubation Date/Time: 05/03/2024 7:33 AM  Performed by: Nada Corean CROME, CRNAPre-anesthesia Checklist: Emergency Drugs available, Suction available, Patient being monitored and Timeout performed Patient Re-evaluated:Patient Re-evaluated prior to induction Oxygen Delivery Method: Circle system utilized Preoxygenation: Pre-oxygenation with 100% oxygen Induction Type: IV induction Ventilation: Mask ventilation without difficulty and Two handed mask ventilation required Laryngoscope Size: Mac and 3 Grade View: Grade I Tube type: Oral Tube size: 7.0 mm Number of attempts: 1 Airway Equipment and Method: Stylet Placement Confirmation: ETT inserted through vocal cords under direct vision, positive ETCO2 and breath sounds checked- equal and bilateral Secured at: 21 cm Tube secured with: Tape Dental Injury: Teeth and Oropharynx as per pre-operative assessment

## 2024-05-03 NOTE — H&P (Signed)
 [name] is an 58 y.o. female.   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  Pain interferes with sleep and quality of life.   Past Medical History:  Diagnosis Date   Arthritis    COPD (chronic obstructive pulmonary disease) (HCC)    denies   COVID-19 10/23/2019   Asymptomatic   Environmental and seasonal allergies    Falls    Fibromyalgia    History of colon polyps 2017   Hypertension    Pneumonia    aspiration PNA   Wound infection    right foot    Past Surgical History:  Procedure Laterality Date   APPLICATION OF WOUND VAC Right 05/20/2020   Procedure: APPLICATION OF WOUND VAC TO RIGHT FOOT;  Surgeon: [name], MD;  Location: [facility] OR;  Service: Orthopedics;  Laterality: Right;   COLONOSCOPY WITH PROPOFOL  N/A 09/03/2016   Procedure: COLONOSCOPY WITH PROPOFOL ;  Surgeon: Ruel Kung, MD;  Location: [facility] ENDOSCOPY;  Service: Endoscopy;  Laterality: N/A;   FRACTURE SURGERY     left foot    INCISION AND DRAINAGE Right 05/20/2020   Procedure: RIGHT FOOT INCISION AND DRAINAGE;  Surgeon: [name], MD;  Location: [facility] OR;  Service: Orthopedics;  Laterality: Right;  LENGTH OF SURGERY: 1 HOUR   KNEE CARTILAGE SURGERY  2015   left   LAPAROSCOPIC SALPINGOOPHERECTOMY     left hand     1 for ganglion cyst and next yr replace joints in left hand   PLANTAR FASCIA SURGERY     left foot   SHOULDER INJECTION Right 05/17/2016   Procedure: SHOULDER INJECTION;  Surgeon: Norleen Gavel, MD;  Location: [facility] OR;  Service: Orthopedics;  Laterality: Right;   SHOULDER SURGERY     x 2 on right shoulder   TONSILLECTOMY     TOTAL HIP ARTHROPLASTY Left 11/20/2021   Procedure: TOTAL HIP ARTHROPLASTY ANTERIOR APPROACH;  Surgeon: Gavel Norleen, MD;  Location: [facility] ORS;  Service: Orthopedics;  Laterality: Left;   TOTAL KNEE ARTHROPLASTY Left 05/17/2016    Procedure: TOTAL KNEE ARTHROPLASTY;  Surgeon: Norleen Gavel, MD;  Location: [facility] OR;  Service: Orthopedics;  Laterality: Left;   TOTAL KNEE ARTHROPLASTY Right 11/23/2019   Procedure: TOTAL KNEE ARTHROPLASTY;  Surgeon: Gavel Norleen, MD;  Location: [facility] OR[facility];  Service: Orthopedics;  Laterality: Right;   TOTAL SHOULDER ARTHROPLASTY Left 05/09/2015   Procedure: TOTAL SHOULDER ARTHROPLASTY;  Surgeon: Norleen Gavel, MD;  Location: [facility] OR;  Service: Orthopedics;  Laterality: Left;   TUBAL LIGATION     tubes removed stated patient    Family History  Problem Relation Age of Onset   Crohn's disease Mother    Heart attack Father    Stroke Father    Hypertension Brother    Hypertension Brother    Social History:  reports that she has been smoking cigarettes. She started smoking about 32 years ago. She has a 30 pack-year smoking history. She has never used smokeless tobacco. She reports current alcohol use. She reports that she does not use drugs.  Allergies:  Allergies  Allergen Reactions   Augmentin [Amoxicillin-Pot Clavulanate] Anaphylaxis, Hives, Shortness Of Breath, Swelling and Rash    Did it involve swelling of the face/tongue/throat, SOB, or low BP? Yes  Did it involve sudden or severe rash/hives, skin peeling, or any reaction on the inside of your mouth or nose? No  Did you need to  seek medical attention at a hospital or doctor's office? Yes  When did it last happen? 15 years ago       If all above answers are NO, may proceed with cephalosporin use.   Bee Venom Anaphylaxis, Hives and Shortness Of Breath    Treats with benadryl    Diclofenac Sodium Itching and Swelling    Can tolerate Voltaren  Cannot use PENNSAID- topical gel   Diclofenac Sodium Hives, Itching, Other (See Comments) and Swelling    Other Reaction(s): Unknown  Can tolerate Voltaren  Cannot use PENNSAID- topical gel   Other Anaphylaxis, Hives and Shortness Of Breath    Reaction to spider bites (treats with benadryl )    Sulfa Antibiotics Anaphylaxis and Other (See Comments)    Childhood allergic reaction  Sulfonamides  Patient reports she had a sulfa antibiotic last year, and did not have a reaction    Medications Prior to Admission  Medication Sig Dispense Refill   acetaminophen  (TYLENOL ) 500 MG tablet Take 1,000 mg by mouth every 8 (eight) hours as needed for moderate pain.     b complex vitamins tablet Take 1 tablet by mouth daily.     Cholecalciferol (DIALYVITE VITAMIN D 5000) 125 MCG (5000 UT) capsule Take 5,000 Units by mouth daily.     cyclobenzaprine  (FLEXERIL ) 10 MG tablet Take 10 mg by mouth 3 (three) times daily as needed for muscle spasms.     hydrochlorothiazide  (HYDRODIURIL ) 25 MG tablet Take 25 mg by mouth daily.     ibuprofen  (ADVIL ) 800 MG tablet Take 800 mg by mouth every 8 (eight) hours as needed for moderate pain (pain score 4-6).     losartan  (COZAAR ) 100 MG tablet Take 100 mg by mouth daily.     vitamin E 180 MG (400 UNITS) capsule Take 400 Units by mouth daily.      No results found for this or any previous visit (from the past 48 hours). No results found.  Review of Systems  All other systems reviewed and are negative.   Blood pressure (!) 138/95, pulse (!) 112, temperature 98.1 F (36.7 C), temperature source Oral, resp. rate (!) 21, height 5' (1.524 m), weight 91.2 kg, SpO2 96%. Physical Exam HENT:     Head: Atraumatic.   Cardiovascular:     Pulses: Normal pulses.   Musculoskeletal:     Comments: Right shoulder pain with limited ROM> NVID.   Skin:    General: Skin is warm and dry.   Neurological:     Mental Status: She is alert.      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

## 2024-05-03 NOTE — Op Note (Signed)
 Procedure(s): ARTHROPLASTY, SHOULDER, TOTAL, REVERSE Procedure Note  [name] female 58 y.o. 05/03/2024  Preoperative diagnosis: Right shoulder arthritis with chronic rotator cuff disease  Postoperative diagnosis: Same  Procedure(s) and Anesthesia Type:    * ARTHROPLASTY, SHOULDER, TOTAL, REVERSE - General   Indications:  58 y.o. female  With advanced right shoulder arthritis with chronic rotator cuff disease status post prior repair. Pain and dysfunction interfered with quality of life and nonoperative treatment with activity modification, NSAIDS and injections failed.     Surgeon: Josefa LELON Herring   Assistants: Jeoffrey Northern PA-C Amber was present and scrubbed throughout the procedure and was essential in positioning, retraction, exposure, and closure)  Anesthesia: General endotracheal anesthesia with preoperative interscalene block given by the attending anesthesiologist     Procedure Detail  ARTHROPLASTY, SHOULDER, TOTAL, REVERSE   Estimated Blood Loss:  200 mL         Drains: none  Blood Given: none          Specimens: none        Complications:  * No complications entered in OR log *         Disposition: PACU - hemodynamically stable.         Condition: stable      OPERATIVE FINDINGS:  A DJO Altivate pressfit reverse total shoulder arthroplasty was placed with a  size 12 stem, a 32-4 glenosphere, and a +4-mm poly insert. The base plate  fixation was excellent.  PROCEDURE: The patient was identified in the preoperative holding area  where I personally marked the operative site after verifying site, side,  and procedure with the patient. An interscalene block given by  the attending anesthesiologist in the holding area and the patient was taken back to the operating room where all extremities were  carefully padded in position after general anesthesia was induced. She  was placed in a beach-chair position and the operative upper extremity was   prepped and draped in a standard sterile fashion. An approximately 10-  cm incision was made from the tip of the coracoid process to the center  point of the humerus at the level of the axilla. Dissection was carried  down through subcutaneous tissues to the level of the cephalic vein  which was taken laterally with the deltoid. The pectoralis major was  retracted medially. The subdeltoid space was developed and the lateral  edge of the conjoined tendon was identified. The undersurface of  conjoined tendon was palpated and the musculocutaneous nerve was not in  the field. Retractor was placed underneath the conjoined and second  retractor was placed lateral into the deltoid. The circumflex humeral  artery and vessels were identified and clamped and coagulated. The  biceps tendon was absent.  The subscapularis was taken down as a peel with the underlying capsule and.  The  joint was then gently externally rotated while the capsule was released  from the humeral neck around to just beyond the 6 o'clock position. At  this point, the joint was dislocated and the humeral head was presented  into the wound. The excessive osteophyte formation was removed with a  large rongeur.  The cutting guide was used to make the appropriate  head cut and the head was saved for potentially bone grafting.  The glenoid was exposed with the arm in an  abducted extended position. The anterior and posterior labrum were  completely excised and the capsule was released circumferentially to  allow for exposure of the glenoid  for preparation. The 2.5 mm drill was  placed using the guide in 5-10 inferior angulation and the tap was then advanced in the same hole. Small and large reamers were then used. The tap was then removed and the Metaglene was then screwed in with excellent purchase.  The peripheral guide was then used to drilled measured and filled peripheral locking screws. The size 32-4 glenosphere was then impacted  on the [facility] taper and the central screw was placed. The humerus was then again exposed and the diaphyseal reamers were used followed by the metaphyseal reamers. The final broach was left in place in the proximal trial was placed. The joint was reduced and with this implant it was felt that soft tissue tensioning was appropriate with excellent stability and excellent range of motion. Therefore, final humeral stem was placed press-fit.  And then the trial polyethylene inserts were tested again and the above implant was felt to be the most appropriate for final insertion. The joint was reduced taken through full range of motion and felt to be stable. Soft tissue tension was appropriate.  The joint was then copiously irrigated with pulse  lavage and the wound was then closed. The subscapularis was repaired with 1 FiberWire through bone tunnel.  Skin was closed with 2-0 Vicryl in a deep dermal layer and 4-0  Monocryl for skin closure. Steri-Strips were applied. Sterile  dressings were then applied as well as a sling. The patient was allowed  to awaken from general anesthesia, transferred to stretcher, and taken  to recovery room in stable condition.   POSTOPERATIVE PLAN: The patient will be observed in the recovery room and if her pain is well-controlled with regional anesthesia and she is hemodynamically stable she can be discharged home today with family.

## 2024-05-03 NOTE — Discharge Instructions (Addendum)
 Discharge Instructions after Reverse Total Shoulder Arthroplasty   A sling has been provided for you. You are to wear this at all times (except for bathing and dressing), until your first post operative visit with Dr. Ave Filter. Please also wear while sleeping at night. While you bath and dress, let the arm/elbow extend straight down to stretch your elbow. Wiggle your fingers and pump your first while your in the sling to prevent hand swelling. Use ice on the shoulder intermittently over the first 48 hours after surgery. Continue to use ice or and ice machine as needed after 48 hours for pain control/swelling.  Pain medicine has been prescribed for you.  Use your medicine liberally over the first 48 hours, and then you can begin to taper your use. You may take Extra Strength Tylenol or Tylenol only in place of the pain pills. DO NOT take ANY nonsteroidal anti-inflammatory pain medications: Advil, Motrin, Ibuprofen, Aleve, Naproxen or Naprosyn.  Take two aspirin 81mg  a day for 2 weeks after surgery, unless you have an aspirin sensitivity/allergy or asthma.  Leave your dressing on until your first follow up visit.  You may shower with the dressing.  Hold your arm as if you still have your sling on while you shower. Simply allow the water to wash over the site and then pat dry. Make sure your axilla (armpit) is completely dry after showering.    Please call [phone] during normal business hours or [phone] after hours for any problems. Including the following:  - excessive redness of the incisions - drainage for more than 4 days - fever of more than 101.5 F  *Please note that pain medications will not be refilled after hours or on weekends.    Dental Antibiotics:  In most cases prophylactic antibiotics for Dental procdeures after total joint surgery are not necessary.  Exceptions are as follows:  1. History of prior total joint infection  2. Severely immunocompromised (Organ  Transplant, cancer chemotherapy, Rheumatoid biologic meds such as Humera)  3. Poorly controlled diabetes (A1C &gt; 8.0, blood glucose over 200)  If you have one of these conditions, contact your surgeon for an antibiotic prescription, prior to your dental procedure.

## 2024-05-03 NOTE — Evaluation (Signed)
 Occupational Therapy Evaluation Patient Details Name: [name] MRN: [id number] DOB: September 26, 1966 Today's Date: 05/03/2024   History of Present Illness   [name] is a 58 yo female s/p R Reverse TSA on 05/03/24. OA, RTC, COPD, HTN     Clinical Impressions PTA pt lives alone with friend assisting who is here for evaluation and training. Patient was independent prior to surgery this am.  Education completed regarding compensatory strategies for ADL tasks and functional mobility, management of sling, R ROM per specified parameters in the order set as indicated below, positioning of operative arm in sitting and supine and edema control, including use of Iceman Cold Therapy machine. Caregiver present for education, written handouts provided and reviewed using Teach Back and pt/caregiver verbalized/demonstrated understanding. Due to the below listed deficits, pt requires min assistance with ADL tasks and Supervision on level surfaces with functional mobility. Caregiver will be able to provide necessary level of assistance at discharge. Pt to follow up with MD to progress rehab of the operative shoulder.      If plan is discharge home, recommend the following:   A little help with walking and/or transfers;A little help with bathing/dressing/bathroom;Assistance with cooking/housework;Assist for transportation;Help with stairs or ramp for entrance     Functional Status Assessment   Patient has had a recent decline in their functional status and demonstrates the ability to make significant improvements in function in a reasonable and predictable amount of time.     Equipment Recommendations   None recommended by OT      Precautions/Restrictions   Precautions Precautions: Shoulder No shoulder ROM allowed; elbow/wrist/hand AROM only. Required Braces or Orthoses: Sling Restrictions Weight Bearing Restrictions Per Provider Order: Yes RUE Weight Bearing Per Provider Order:  Non weight bearing     Mobility Bed Mobility  Patient reports she will sleep in the recliner at home                   Transfers Overall transfer level: Modified independent                        Balance Overall balance assessment: No apparent balance deficits (not formally assessed)                                         ADL either performed or assessed with clinical judgement   ADL Overall ADL's : Needs assistance/impaired  Per orders, R shoulder parameters as follows for ADL tasks: No shoulder ROM; elbow/wrist/hand ROM only. While moving within specified parameters, pt/caregiver instructed on bathing and how to donn/doff shirt, placing operative arm through sleeve first when donning and off last when doffing.Pt/caregiver educated on compensatory strategies for LB ADL and strategies to reduce risk of falls.  Pt/caregiver educated on donning/doffing sling and to wear the sling at all times with the exception of ADL, and to loosen the neck strap of the sling when the operative arm is in a supported position when sitting. In sitting or supine, pt instructed to have a pillow behind and under their operative arm to provide support. If assist needed with ambulation, caregiver educated on the importance of walking on pt's non-operative side.  Education regarding use of IceMan Cold Therapy completed, including the importance of using a barrier on the shoulder prior to positioning the wrap-on pad. Pt/caregiver verbalized/demonstrated understanding. Teach Back used while caregiver assisted  with dressing pt and positioning wrap-on pad to facilitate DC.                                            Vision Baseline Vision/History: 0 No visual deficits Ability to See in Adequate Light: 0 Adequate Vision Assessment?: No apparent visual deficits            Pertinent Vitals/Pain Pain Assessment Pain Assessment: No/denies pain      Extremity/Trunk Assessment Upper Extremity Assessment Upper Extremity Assessment: Right hand dominant;RUE deficits/detail RUE Deficits / Details: R n block active, AAROM elbow, wrist, hand WFL   Lower Extremity Assessment Lower Extremity Assessment: Overall WFL for tasks assessed   Cervical / Trunk Assessment Cervical / Trunk Assessment: Normal   Communication Communication Communication: No apparent difficulties   Cognition Arousal: Alert Behavior During Therapy: WFL for tasks assessed/performed Cognition: No apparent impairments                               Following commands: Intact       Cueing  General Comments   Cueing Techniques: Verbal cues  L post op dressing in place   Exercises Exercises: Shoulder Shoulder Exercises Elbow Flexion: 10 reps, AAROM Elbow Extension: 10 reps, AAROM Wrist Flexion: 10 reps, AAROM Wrist Extension: 10 reps, AAROM   Shoulder Instructions Shoulder Instructions Donning/doffing shirt without moving shoulder: Caregiver independent with task;Patient able to independently direct caregiver Method for sponge bathing under operated UE: Caregiver independent with task;Patient able to independently direct caregiver Donning/doffing sling/immobilizer: Caregiver independent with task;Patient able to independently direct caregiver Correct positioning of sling/immobilizer: Caregiver independent with task;Patient able to independently direct caregiver Pendulum exercises (written home exercise program): Caregiver independent with task;Patient able to independently direct caregiver ROM for elbow, wrist and digits of operated UE: Caregiver independent with task;Patient able to independently direct caregiver Sling wearing schedule (on at all times/off for ADL's): Caregiver independent with task;Patient able to independently direct caregiver Proper positioning of operated UE when showering: Caregiver independent with task;Patient able to  independently direct caregiver Positioning of UE while sleeping: Caregiver independent with task;Patient able to independently direct caregiver    Home Living Family/patient expects to be discharged to:: Private residence Living Arrangements: Alone Available Help at Discharge: Family;Friend(s) Type of Home: House Home Access: Stairs to enter [employer] of Steps: 3 Entrance Stairs-Rails: Can reach both;Right;Left Home Layout: One level     Bathroom Shower/Tub: Chief Strategy Officer: Standard Bathroom Accessibility: Yes   Home Equipment: Rollator (4 wheels);Rolling Walker (2 wheels);Tub bench;Shower seat   Additional Comments: employed at [employer]      Prior Functioning/Environment Prior Level of Function : Independent/Modified Independent                    OT Problem List: Impaired UE functional use    AM-PAC OT 6 Clicks Daily Activity     Outcome Measure Help from another person eating meals?: None Help from another person taking care of personal grooming?: None Help from another person toileting, which includes using toliet, bedpan, or urinal?: A Little Help from another person bathing (including washing, rinsing, drying)?: A Little Help from another person to put on and taking off regular upper body clothing?: A Little Help from another person to put on and taking off regular lower  body clothing?: A Little 6 Click Score: 20   End of Session Equipment Utilized During Treatment: Gait belt Nurse Communication: Mobility status;Precautions;Weight bearing status  Activity Tolerance: Patient tolerated treatment well Patient left: in chair;with call bell/phone within reach;with nursing/sitter in room;with family/visitor present  OT Visit Diagnosis: Other (comment) (OT dysfunction)                Time: 8844-8764 OT Time Calculation (min): 40 min Charges:  OT General Charges $OT Visit: 1 Visit OT Evaluation $OT Eval Low Complexity: 1  Low OT Treatments $Self Care/Home Management : 23-37 mins  Azam Gervasi OT/L Acute Rehabilitation Department  [phone]  05/03/2024, 1:11 PM

## 2024-05-04 ENCOUNTER — Encounter (HOSPITAL_COMMUNITY): Payer: Self-pay | Admitting: Orthopedic Surgery

## 2024-08-28 ENCOUNTER — Ambulatory Visit (INDEPENDENT_AMBULATORY_CARE_PROVIDER_SITE_OTHER)

## 2024-08-28 ENCOUNTER — Ambulatory Visit (INDEPENDENT_AMBULATORY_CARE_PROVIDER_SITE_OTHER): Admitting: Podiatry

## 2024-08-28 ENCOUNTER — Encounter: Payer: Self-pay | Admitting: Podiatry

## 2024-08-28 VITALS — Ht 60.0 in | Wt 201.0 lb

## 2024-08-28 DIAGNOSIS — M19072 Primary osteoarthritis, left ankle and foot: Secondary | ICD-10-CM | POA: Diagnosis not present

## 2024-08-28 DIAGNOSIS — M7751 Other enthesopathy of right foot: Secondary | ICD-10-CM | POA: Diagnosis not present

## 2024-08-28 DIAGNOSIS — M19071 Primary osteoarthritis, right ankle and foot: Secondary | ICD-10-CM | POA: Diagnosis not present

## 2024-08-28 DIAGNOSIS — M7752 Other enthesopathy of left foot: Secondary | ICD-10-CM

## 2024-08-28 NOTE — Progress Notes (Signed)
   Chief Complaint  Patient presents with   Foot Pain    Pt is here due to left foot pain, there is a lump on the top of the foot, she states that its been there for a long time, was being seen at [facility] not pleased with care.    HPI: 58 y.o. female presenting today as a reestablish new patient for evaluation of chronic pain and tenderness to the bilateral feet secondary to arthritis.  History of RT foot surgery at [facility] with Dr. Elsa.  She continues to have pain and tenderness to the bilateral feet.   Past Medical History:  Diagnosis Date   Arthritis    COPD (chronic obstructive pulmonary disease) (HCC)    denies   COVID-19 10/23/2019   Asymptomatic   Environmental and seasonal allergies    Falls    Fibromyalgia    History of colon polyps 2017   Hypertension    Pneumonia    aspiration PNA   Wound infection    right foot     Physical Exam: General: The patient is alert and oriented x3 in no acute distress.  Dermatology: Skin is warm, dry and supple bilateral lower extremities. Negative for open lesions or macerations.  Vascular: Palpable pedal pulses bilaterally. No edema or erythema noted. Capillary refill within normal limits.  Neurological: Grossly intact via light touch  Musculoskeletal Exam: Chronic pain with palpation noted to the dorsal aspect of the bilateral midfoot. Range of motion within normal limits to all pedal and ankle joints bilateral. Muscle strength 5/5 in all groups bilateral.   Radiographic exam B/L feet 08/28/2024: Chronic severe degenerative arthritis noted throughout the midtarsal joints of the bilateral feet.  No acute fracture identified.  Assessment: 1.   chronic severe arthritis midtarsal joints bilateral  Plan of Care:  -Patient evaluated.  X-rays reviewed -Order placed for CT scan bilateral feet -Ultimately the patient may require surgical arthrodesis of the midfoot.  CT scan necessary to evaluate the feet and  arthritis preoperative planning -Return to clinic after CT scan results are available to review and discuss treatment options  Works at [employer] on feet all day.       Thresa EMERSON Sar, DPM [facility]  Dr. Thresa EMERSON Sar, DPM    [address] [place], [place] [address]                Office [phone]  Fax [phone]

## 2024-08-29 ENCOUNTER — Other Ambulatory Visit

## 2024-09-07 ENCOUNTER — Ambulatory Visit
Admission: RE | Admit: 2024-09-07 | Discharge: 2024-09-07 | Disposition: A | Discharge status: Home / Self Care | Source: Ambulatory Visit | Attending: Podiatry | Admitting: Podiatry

## 2024-09-07 DIAGNOSIS — M19072 Primary osteoarthritis, left ankle and foot: Secondary | ICD-10-CM

## 2024-09-07 DIAGNOSIS — M19071 Primary osteoarthritis, right ankle and foot: Secondary | ICD-10-CM

## 2024-09-18 ENCOUNTER — Encounter: Payer: Self-pay | Admitting: Podiatry

## 2024-09-18 ENCOUNTER — Ambulatory Visit: Admitting: Podiatry

## 2024-09-18 VITALS — Ht 60.0 in | Wt 201.0 lb

## 2024-09-18 DIAGNOSIS — M19072 Primary osteoarthritis, left ankle and foot: Secondary | ICD-10-CM | POA: Diagnosis not present

## 2024-09-18 DIAGNOSIS — M19071 Primary osteoarthritis, right ankle and foot: Secondary | ICD-10-CM

## 2024-09-18 MED ORDER — BETAMETHASONE SOD PHOS & ACET 6 (3-3) MG/ML IJ SUSP
3.0000 mg | Freq: Once | INTRAMUSCULAR | Status: AC
  Administered 2024-09-18: 3 mg via INTRA_ARTICULAR

## 2024-09-18 NOTE — Progress Notes (Signed)
 Chief Complaint  Patient presents with   Foot Pain    Pt is here to discuss recent MRI results and options due to pain in bilateral feet, states the pain is still there.    HPI: 58 y.o. female presenting today for follow-up evaluation of chronic pain and tenderness to the bilateral feet secondary to arthritis.  History of RT foot surgery at [facility] orth[facility]ics with Dr. Elsa.  She continues to have pain and tenderness to the bilateral feet.  Past Medical History:  Diagnosis Date   Arthritis    COPD (chronic obstructive pulmonary disease) (HCC)    denies   COVID-19 10/23/2019   Asymptomatic   Environmental and seasonal allergies    Falls    Fibromyalgia    History of colon polyps 2017   Hypertension    Pneumonia    aspiration PNA   Wound infection    right foot   Past Surgical History:  Procedure Laterality Date   APPLICATION OF WOUND VAC Right 05/20/2020   Procedure: APPLICATION OF WOUND VAC TO RIGHT FOOT;  Surgeon: [name], MD;  Location: [facility] OR;  Service: Orthopedics;  Laterality: Right;   COLONOSCOPY WITH PROPOFOL  N/A 09/03/2016   Procedure: COLONOSCOPY WITH PROPOFOL ;  Surgeon: Ruel Kung, MD;  Location: [facility] ENDOSCOPY;  Service: Endoscopy;  Laterality: N/A;   FRACTURE SURGERY     left foot    INCISION AND DRAINAGE Right 05/20/2020   Procedure: RIGHT FOOT INCISION AND DRAINAGE;  Surgeon: [name], MD;  Location: [facility] OR;  Service: Orthopedics;  Laterality: Right;  LENGTH OF SURGERY: 1 HOUR   KNEE CARTILAGE SURGERY  2015   left   LAPAROSCOPIC SALPINGOOPHERECTOMY     left hand     1 for ganglion cyst and next yr replace joints in left hand   PLANTAR FASCIA SURGERY     left foot   REVERSE SHOULDER ARTHROPLASTY Right 05/03/2024   Procedure: ARTHROPLASTY, SHOULDER, TOTAL, REVERSE;  Surgeon: Dozier Soulier, MD;  Location: [facility] ORS;  Service: Orthopedics;  Laterality: Right;   SHOULDER INJECTION Right 05/17/2016   Procedure: SHOULDER INJECTION;   Surgeon: Norleen Gavel, MD;  Location: [facility] OR;  Service: Orthopedics;  Laterality: Right;   SHOULDER SURGERY     x 2 on right shoulder   TONSILLECTOMY     TOTAL HIP ARTHROPLASTY Left 11/20/2021   Procedure: TOTAL HIP ARTHROPLASTY ANTERIOR APPROACH;  Surgeon: Gavel Norleen, MD;  Location: [facility]S;  Service: Orthopedics;  Laterality: Left;   TOTAL KNEE ARTHROPLASTY Left 05/17/2016   Procedure: TOTAL KNEE ARTHROPLASTY;  Surgeon: Norleen Gavel, MD;  Location: [facility] OR;  Service: Orthopedics;  Laterality: Left;   TOTAL KNEE ARTHROPLASTY Right 11/23/2019   Procedure: TOTAL KNEE ARTHROPLASTY;  Surgeon: Gavel Norleen, MD;  Location: [facility];  Service: Orthopedics;  Laterality: Right;   TOTAL SHOULDER ARTHROPLASTY Left 05/09/2015   Procedure: TOTAL SHOULDER ARTHROPLASTY;  Surgeon: Norleen Gavel, MD;  Location: [facility] OR;  Service: Orthopedics;  Laterality: Left;   TUBAL LIGATION     tubes removed stated patient   Allergies  Allergen Reactions   Augmentin [Amoxicillin-Pot Clavulanate] Anaphylaxis, Hives, Shortness Of Breath, Swelling and Rash    Did it involve swelling of the face/tongue/throat, SOB, or low BP? Yes  Did it involve sudden or severe rash/hives, skin peeling, or any reaction on the inside of your mouth or nose? No  Did you need to seek medical attention at a hospital or doctor's office? Yes  When did it last happen?  15 years ago       If all above answers are NO, may proceed with cephalosporin use.   Bee Venom Anaphylaxis, Hives and Shortness Of Breath    Treats with benadryl    Diclofenac Sodium Itching and Swelling    Can tolerate Voltaren  Cannot use PENNSAID- topical gel   Diclofenac Sodium Hives, Itching, Other (See Comments) and Swelling    Other Reaction(s): Unknown  Can tolerate Voltaren  Cannot use PENNSAID- topical gel   Other Anaphylaxis, Hives and Shortness Of Breath    Reaction to spider bites (treats with benadryl )   Sulfa Antibiotics Anaphylaxis and Other (See Comments)     Childhood allergic reaction  Sulfonamides  Patient reports she had a sulfa antibiotic last year, and did not have a reaction    Physical Exam: General: The patient is alert and oriented x3 in no acute distress.  Dermatology: Skin is warm, dry and supple bilateral lower extremities. Negative for open lesions or macerations.  Vascular: Palpable pedal pulses bilaterally. No edema or erythema noted. Capillary refill within normal limits.  Neurological: Grossly intact via light touch  Musculoskeletal Exam: Chronic pain with palpation noted to the dorsal aspect of the bilateral midfoot.  Tenderness with palpation diffusely throughout the bilateral feet more severe to the left.  Significant pain to palpation of the sinus tarsi left  Radiographic exam B/L feet 08/28/2024: Chronic severe degenerative arthritis noted throughout the midtarsal joints of the bilateral feet.  No acute fracture identified.  CT FOOT RIGHT WO CONTRAST (Accession [id number]) (Order [id number]) Imaging Date: 09/07/2024 Department: [facility] Released By: Jakie Milling Authorizing: Janit Thresa HERO, DPM  IMPRESSION: 1. Fractured legs of the dorsal staple-type fixators along the Lisfranc joint. Associated degenerative findings without bony union. 2. Degenerative arthropathy at the calcaneocuboid articulation with chondral thinning and spurring. 3. Degenerative chondral thinning between the base of the 5th metatarsal and the cuboid. 4. Moderate dorsal midfoot spurring. 5. Degenerative spurring of the head of the 1st metatarsal. 6. Moderate distal tibial rim spurring. 7. Mildly thickened medial band of the plantar fascia with a chronically fragmented plantar calcaneal spur. 8. No significant erosions.  CT FOOT LEFT WO CONTRAST (Accession [id number]) (Order [id number]) Imaging Date: 09/07/2024 Department: [facility] Released By: Jakie Milling Authorizing: Janit Thresa HERO, DPM  IMPRESSION: 1.  No acute osseous abnormality. 2. Substantial degenerative midfoot arthropathy, particularly between the navicular and the lateral cuneiform, with severe loss of articular space, irregular spurring, and confluent degenerative subcortical cyst formation. 3. Moderate to severe degenerative arthropathy between the navicular and middle cuneiform with associated spurring, degenerative subcortical cyst formation, loss of articular space, and subcortical sclerosis. 4. Moderate degenerative arthropathy between the navicular and the medial cuneiform. 5. 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 6. Moderate degenerative arthropathy at the talonavicular articulation. 7. Mild to moderate degenerative arthropathy of the Lisfranc joint with articular space narrowing and mild spurring. 8. Moderate spurring of the distal tibial rim with small degenerative subcortical cysts or nonfragmented osteochondral lesions along the talar dome medially and laterally, and mild distal tibiofibular joint marginal spurring. 9. Dorsal midfoot spurring. 10. Deformity of the proximal phalanx third toe likely from an old fracture.    Assessment: 1.   chronic severe arthritis midtarsal joints bilateral  Plan of Care:  -Patient evaluated.  CT scans reviewed -Given the amount of arthritis throughout the foot diffusely I do not believe that surgery would be  beneficial for the patient.  The potential for complications would be very high and it would be unrealistic to be able to completely fuse the entire midfoot given the extensive arthritis throughout the midtarsal and intercuneiform and tarsometatarsal joints.  Recommend conservative care -Injection of 0.5 cc Celestone  Soluspan injected into the sinus tarsi left since this was the area of most symptoms -OTC power step insoles were dispensed.  Wear daily -Referral placed for  [facility]. -Return to clinic PRN  Works at [employer] on feet all day.       Thresa EMERSON Sar, DPM [facility]  Dr. Thresa EMERSON Sar, DPM    [address] [place], [place] [address]                Office [phone]  Fax [phone]

## 2024-09-25 ENCOUNTER — Ambulatory Visit: Admitting: Podiatry

## 2024-09-26 ENCOUNTER — Ambulatory Visit (INDEPENDENT_AMBULATORY_CARE_PROVIDER_SITE_OTHER): Admitting: Dermatology

## 2024-09-26 DIAGNOSIS — Z7189 Other specified counseling: Secondary | ICD-10-CM | POA: Diagnosis not present

## 2024-09-26 DIAGNOSIS — L814 Other melanin hyperpigmentation: Secondary | ICD-10-CM

## 2024-09-26 DIAGNOSIS — L821 Other seborrheic keratosis: Secondary | ICD-10-CM

## 2024-09-26 DIAGNOSIS — B079 Viral wart, unspecified: Secondary | ICD-10-CM

## 2024-09-26 NOTE — Patient Instructions (Addendum)
 Seborrheic Keratosis  What causes seborrheic keratoses? Seborrheic keratoses are harmless, common skin growths that first appear during adult life.  As time goes by, more growths appear.  Some people may develop a large number of them.  Seborrheic keratoses appear on both covered and uncovered body parts.  They are not caused by sunlight.  The tendency to develop seborrheic keratoses can be inherited.  They vary in color from skin-colored to gray, brown, or even black.  They can be either smooth or have a rough, warty surface.   Seborrheic keratoses are superficial and look as if they were stuck on the skin.  Under the microscope this type of keratosis looks like layers upon layers of skin.  That is why at times the top layer may seem to fall off, but the rest of the growth remains and re-grows.    Treatment Seborrheic keratoses do not need to be treated, but can easily be removed in the office.  Seborrheic keratoses often cause symptoms when they rub on clothing or jewelry.  Lesions can be in the way of shaving.  If they become inflamed, they can cause itching, soreness, or burning.  Removal of a seborrheic keratosis can be accomplished by freezing, burning, or surgery. If any spot bleeds, scabs, or grows rapidly, please return to have it checked, as these can be an indication of a skin cancer.  Lentigos face, freckles Counseling for BBL / IPL / Laser and Coordination of Care Discussed the treatment option of Broad Band Light (BBL) /Intense Pulsed Light (IPL)/ Laser for skin discoloration, including brown spots and redness.  Typically we recommend at least 1-3 treatment sessions about 5-8 weeks apart for best results.  Cannot have tanned skin when BBL performed, and regular use of sunscreen/photoprotection is advised after the procedure to help maintain results. The patient's condition may also require maintenance treatments in the future.  The fee for BBL / laser treatments is $350 per treatment  session for the whole face.  A fee can be quoted for other parts of the body.  Insurance typically does not pay for BBL/laser treatments and therefore the fee is an out-of-pocket cost. Recommend prophylactic valtrex treatment. Once scheduled for procedure, will send Rx in prior to patient's appointment.    Cryotherapy Aftercare  Wash gently with soap and water  everyday.   Apply Vaseline and Band-Aid daily until healed.   Discussed cosmetic procedure LN2 (freezing), noncovered.  $60 for 1st lesion and $15 for each additional lesion if done on the same day.  Maximum charge $350.  One touch-up treatment included no charge. Discussed risks of treatment including dyspigmentation, small scar, and/or recurrence. Recommend daily broad spectrum sunscreen SPF 30+/photoprotection to treated areas once healed.    Viral Warts & Molluscum Contagiosum  Viral warts and molluscum contagiosum are growths of the skin caused by viral infection of the skin. If you have been given the diagnosis of viral warts or molluscum contagiosum there are a few things that you must understand about your condition:  There is no guaranteed treatment method available for this condition. Multiple treatments may be required, The treatments may be time consuming and require multiple visits to the dermatology office. The treatment may be expensive. You will be charged each time you come into the office to have the spots treated. The treated areas may develop new lesions further complicating treatment. The treated areas may leave a scar. There is no guarantee that even after multiple treatments that the spots will be successfully  treated. These are caused by a viral infection and can be spread to other areas of the skin and to other people by direct contact. Therefore, new spots may occur.   Basic OTC daily skin care regimen to prevent photoaging:   Recommend facial moisturizer with sunscreen SPF 30 every morning (OTC brands  include CeraVe AM, Neutrogena, Eucerin, Cetaphil, Aveeno, La Roche Posay).  Can also apply a topical Vit C serum which is an antioxidant (OTC brands include CeraVe, La Roche Posay, Neutrogena and The Ordinary) underneath sunscreen in morning. If you are outside during the day in the summer for extended periods, especially swimming and/or sweating, make sure you apply a water  resistant facial sunscreen lotion spf 30 or higher.   At night recommend a cream with retinol (a vitamin A derivative which stimulates collagen production) like CeraVe skin renewing retinol serum or ROC retinol correxion cream or Neutrogena rapid wrinkle repair cream. Retinol may cause skin irritation in people with sensitive skin.  Can use it every other day and/or apply on top of a hyaluronic acid (HA) moisturizer/serum (Neutrogena Hydroboost water  cream) if better tolerated that way.  Retinol may also help with lightening brown spots.   Our office sells high quality, medically tested skin care lines such as Elta MD sunscreens (with Zinc ), and Alastin skin care products, which are very effective in treating photoaging. The Alastin line includes cosmeceutical grade Vit.C serum, HA serum, Elastin stimulating moisturizers/serums, lightening serum, and sunscreens.  If you want prescription treatment, then you would need an appointment (Rx tretinoin and fade creams, Botox, filler injections, laser treatments, etc.) These prescriptions and procedures are not covered by insurance but work very well.    Due to recent changes in healthcare laws, you may see results of your pathology and/or laboratory studies on MyChart before the doctors have had a chance to review them. We understand that in some cases there may be results that are confusing or concerning to you. Please understand that not all results are received at the same time and often the doctors may need to interpret multiple results in order to provide you with the best plan of care  or course of treatment. Therefore, we ask that you please give us  2 business days to thoroughly review all your results before contacting the office for clarification. Should we see a critical lab result, you will be contacted sooner.   If You Need Anything After Your Visit  If you have any questions or concerns for your doctor, please call our main line at [phone] and press option 4 to reach your doctor's medical assistant. If no one answers, please leave a voicemail as directed and we will return your call as soon as possible. Messages left after 4 pm will be answered the following business day.   You may also send us  a message via MyChart. We typically respond to MyChart messages within 1-2 business days.  For prescription refills, please ask your pharmacy to contact our office. Our fax number is [phone].  If you have an urgent issue when the clinic is closed that cannot wait until the next business day, you can page your doctor at the number below.    Please note that while we do our best to be available for urgent issues outside of office hours, we are not available 24/7.   If you have an urgent issue and are unable to reach us , you may choose to seek medical care at your doctor's office, retail clinic, urgent care  center, or emergency room.  If you have a medical emergency, please immediately call 911 or go to the emergency department.  Pager Numbers  - Dr. Hester: [phone]  - Dr. Jackquline: [phone]  - Dr. Claudene: [phone]   - Dr. Raymund: [phone]  In the event of inclement weather, please call our main line at [phone] for an update on the status of any delays or closures.  Dermatology Medication Tips: Please keep the boxes that topical medications come in in order to help keep track of the instructions about where and how to use these. Pharmacies typically print the medication instructions only on the boxes and not directly on the medication tubes.    If your medication is too expensive, please contact our office at [phone] option 4 or send us  a message through MyChart.   We are unable to tell what your co-pay for medications will be in advance as this is different depending on your insurance coverage. However, we may be able to find a substitute medication at lower cost or fill out paperwork to get insurance to cover a needed medication.   If a prior authorization is required to get your medication covered by your insurance company, please allow us  1-2 business days to complete this process.  Drug prices often vary depending on where the prescription is filled and some pharmacies may offer cheaper prices.  The website www.goodrx.com contains coupons for medications through different pharmacies. The prices here do not account for what the cost may be with help from insurance (it may be cheaper with your insurance), but the website can give you the price if you did not use any insurance.  - You can print the associated coupon and take it with your prescription to the pharmacy.  - You may also stop by our office during regular business hours and pick up a GoodRx coupon card.  - If you need your prescription sent electronically to a different pharmacy, notify our office through [facility] or by phone at [phone] option 4.     Si Usted Necesita Algo Despus de Su Visita  Tambin puede enviarnos un mensaje a travs de Clinical Cytogeneticist. Por lo general respondemos a los mensajes de MyChart en el transcurso de 1 a 2 das hbiles.  Para renovar recetas, por favor pida a su farmacia que se ponga en contacto con nuestra oficina. Randi lakes de fax es [place] [phone].  Si tiene un asunto urgente cuando la clnica est cerrada y que no puede esperar hasta el siguiente da hbil, puede llamar/localizar a su doctor(a) al nmero que aparece a continuacin.   Por favor, tenga en cuenta que aunque hacemos todo lo posible para estar  disponibles para asuntos urgentes fuera del horario de [place], no estamos disponibles las 24 horas del da, los 7 [address]  [address] de la [place].   Si tiene un problema urgente y no puede comunicarse con nosotros, puede optar por buscar atencin mdica  en el consultorio de su doctor(a), en una clnica privada, en un centro de atencin urgente o en una sala de emergencias.  Si tiene engineer, drilling, por favor llame inmediatamente al 911 o vaya a la sala de emergencias.  Nmeros de bper  - Dr. Hester: [phone]  - Dra. Jackquline: [phone]  - Dr. Claudene: [phone]  - Dra. Kitts: [phone]  En caso de inclemencias del [place], por favor llame a nuestra lnea principal al [phone] para una actualizacin sobre el estado de cualquier retraso o cierre.  Consejos para  la medicacin en dermatologa: Por favor, guarde las cajas en las que vienen los medicamentos de uso tpico para ayudarle a seguir las instrucciones sobre dnde y cmo usarlos. Las farmacias generalmente imprimen las instrucciones del medicamento slo en las cajas y no directamente en los tubos del [place].   Si su medicamento es muy caro, por favor, pngase en contacto con landry rieger llamando al [phone] y presione la opcin 4 o envenos un mensaje a travs de Clinical Cytogeneticist.   No podemos decirle cul ser su copago por los medicamentos por adelantado ya que esto es diferente dependiendo de la cobertura de su seguro. Sin embargo, es posible que podamos encontrar un medicamento sustituto a audiological scientist un formulario para que el seguro cubra el medicamento que se considera necesario.   Si se requiere una autorizacin previa para que su compaa de seguros cubra su medicamento, por favor permtanos de 1 a 2 das hbiles para completar este proceso.  Los precios de los medicamentos varan con frecuencia dependiendo del environmental consultant de dnde se surte la receta y alguna farmacias pueden ofrecer precios ms baratos.  El  sitio web www.goodrx.com tiene cupones para medicamentos de health and safety inspector. Los precios aqu no tienen en cuenta lo que podra costar con la ayuda del seguro (puede ser ms barato con su seguro), pero el sitio web puede darle el precio si no utiliz tourist information centre manager.  - Puede imprimir el cupn correspondiente y llevarlo con su receta a la farmacia.  - Tambin puede pasar por nuestra oficina durante el horario de atencin regular y education officer, museum una tarjeta de cupones de GoodRx.  - Si necesita que su receta se enve electrnicamente a una farmacia diferente, informe a nuestra oficina a travs de MyChart de [place] o por telfono llamando al [phone] y presione la opcin 4.

## 2024-09-26 NOTE — Progress Notes (Signed)
 Follow-Up Visit   Subjective  [name] is a 58 y.o. female who presents for the following: check dark spots face, started ~42yr ago, no symptoms, check spot L elbow, ~34m, tender with pressure   The following portions of the chart were reviewed this encounter and updated as appropriate: medications, allergies, medical history  Review of Systems:  No other skin or systemic complaints except as noted in HPI or Assessment and Plan.  Objective  Well appearing patient in no apparent distress; mood and affect are within normal limits.   A focused examination was performed of the following areas: face  Relevant exam findings are noted in the Assessment and Plan.  L elbow x 1 Verrucous pap L elbow  Assessment & Plan   SEBORRHEIC KERATOSIS Face Discussed cosmetic procedure LN2, noncovered.  $60 for 1st lesion and $15 for each additional lesion if done on the same day.  Maximum charge $350.  One touch-up treatment included no charge. Discussed risks of treatment including dyspigmentation, small scar, and/or recurrence. Recommend daily broad spectrum sunscreen SPF 30+/photoprotection to treated areas once healed.  - Stuck-on, waxy, tan-brown papules and/or plaques  - Benign-appearing - Discussed benign etiology and prognosis. - Observe - Call for any changes  LENTIGINES Exam: scattered tan macules face Due to sun exposure Treatment Plan: Benign-appearing, observe. Recommend daily broad spectrum sunscreen SPF 30+ to sun-exposed areas, reapply every 2 hours as needed.  Call for any changes Samples of Eucerin Radiant tone and [place] Posay Dark spot moisturizers,   Counseling for BBL / IPL / Laser and Coordination of Care Discussed the treatment option of Broad Band Light (BBL) /Intense Pulsed Light (IPL)/ Laser for skin discoloration, including brown spots and redness.  Typically we recommend at least 1-3 treatment sessions about 5-8 weeks apart for best results.  Cannot have  tanned skin when BBL performed, and regular use of sunscreen/photoprotection is advised after the procedure to help maintain results. The patient's condition may also require maintenance treatments in the future.  The fee for BBL / laser treatments is $350 per treatment session for the whole face.  A fee can be quoted for other parts of the body.  Insurance typically does not pay for BBL/laser treatments and therefore the fee is an out-of-pocket cost. Recommend prophylactic valtrex treatment. Once scheduled for procedure, will send Rx in prior to patient's appointment.   VIRAL WARTS, UNSPECIFIED TYPE L elbow x 1 Viral Wart (HPV) Counseling  Discussed viral / HPV (Human Papilloma Virus) etiology and risk of spread /infectivity to other areas of body as well as to other people.  Multiple treatments and methods may be required to clear warts and it is possible treatment may not be successful.  Treatment risks include discoloration; scarring and there is still potential for wart recurrence. Destruction of lesion - L elbow x 1  Destruction method: cryotherapy   Informed consent: discussed and consent obtained   Lesion destroyed using liquid nitrogen: Yes   Region frozen until ice ball extended beyond lesion: Yes   Outcome: patient tolerated procedure well with no complications   Post-procedure details: wound care instructions given   Additional details:  Prior to procedure, discussed risks of blister formation, small wound, skin dyspigmentation, or rare scar following cryotherapy. Recommend Vaseline ointment to treated areas while healing.    Return if symptoms worsen or fail to improve.  I, Grayce Saunas, RMA, am acting as scribe for Rexene Rattler, MD .   Documentation: I have reviewed  the above documentation for accuracy and completeness, and I agree with the above.  Rexene Rattler, MD

## 2024-10-10 ENCOUNTER — Ambulatory Visit: Admitting: Podiatry

## 2024-10-10 ENCOUNTER — Encounter: Payer: Self-pay | Admitting: Podiatry

## 2024-10-10 VITALS — Ht 60.0 in | Wt 201.0 lb

## 2024-10-10 DIAGNOSIS — M19072 Primary osteoarthritis, left ankle and foot: Secondary | ICD-10-CM | POA: Diagnosis not present

## 2024-10-10 MED ORDER — HYDROCODONE-ACETAMINOPHEN 10-325 MG PO TABS: 1.0000 | ORAL_TABLET | Freq: Three times a day (TID) | ORAL | 0 refills | Status: DC | PRN

## 2024-10-10 NOTE — Progress Notes (Signed)
 Chief Complaint  Patient presents with   Foot Pain    Pt is here due to left foot, pain states she has an appointment with pain management in February, wants to know she can have something to help the pain until then.    HPI: 58 y.o. female presenting today for follow-up evaluation of chronic pain and tenderness to the bilateral feet secondary to arthritis.  History of RT foot surgery at [facility] orthopedics with Dr. Elsa.  Although the injection helped she continues to have significant chronic pain and tenderness associated bilateral feet.  She has an appointment with [facility] pain management for 12/27/2024.  She is requesting pain medicine until she can follow-up with [facility]  Past Medical History:  Diagnosis Date   Arthritis    COPD (chronic obstructive pulmonary disease) (HCC)    denies   COVID-19 10/23/2019   Asymptomatic   Environmental and seasonal allergies    Falls    Fibromyalgia    History of colon polyps 2017   Hypertension    Pneumonia    aspiration PNA   Wound infection    right foot   Past Surgical History:  Procedure Laterality Date   APPLICATION OF WOUND VAC Right 05/20/2020   Procedure: APPLICATION OF WOUND VAC TO RIGHT FOOT;  Surgeon: [name], MD;  Location: [facility] OR;  Service: Orthopedics;  Laterality: Right;   COLONOSCOPY WITH PROPOFOL  N/A 09/03/2016   Procedure: COLONOSCOPY WITH PROPOFOL ;  Surgeon: Ruel Kung, MD;  Location: [facility] ENDOSCOPY;  Service: Endoscopy;  Laterality: N/A;   FRACTURE SURGERY     left foot    INCISION AND DRAINAGE Right 05/20/2020   Procedure: RIGHT FOOT INCISION AND DRAINAGE;  Surgeon: [name], MD;  Location: [facility];  Service: Orthopedics;  Laterality: Right;  LENGTH OF SURGERY: 1 HOUR   KNEE CARTILAGE SURGERY  2015   left   LAPAROSCOPIC SALPINGOOPHERECTOMY     left hand     1 for ganglion cyst and next yr replace joints in left hand   PLANTAR FASCIA SURGERY     left foot   REVERSE SHOULDER ARTHROPLASTY Right  05/03/2024   Procedure: ARTHROPLASTY, SHOULDER, TOTAL, REVERSE;  Surgeon: Dozier Soulier, MD;  Location: [facility] ORS;  Service: Orthopedics;  Laterality: Right;   SHOULDER INJECTION Right 05/17/2016   Procedure: SHOULDER INJECTION;  Surgeon: Norleen Gavel, MD;  Location: [facility] OR;  Service: Orthopedics;  Laterality: Right;   SHOULDER SURGERY     x 2 on right shoulder   TONSILLECTOMY     TOTAL HIP ARTHROPLASTY Left 11/20/2021   Procedure: TOTAL HIP ARTHROPLASTY ANTERIOR APPROACH;  Surgeon: Gavel Norleen, MD;  Location: [facility] ORS;  Service: Orthopedics;  Laterality: Left;   TOTAL KNEE ARTHROPLASTY Left 05/17/2016   Procedure: TOTAL KNEE ARTHROPLASTY;  Surgeon: Norleen Gavel, MD;  Location: [facility] OR;  Service: Orthopedics;  Laterality: Left;   TOTAL KNEE ARTHROPLASTY Right 11/23/2019   Procedure: TOTAL KNEE ARTHROPLASTY;  Surgeon: Gavel Norleen, MD;  Location: [facility] OR[facility];  Service: Orthopedics;  Laterality: Right;   TOTAL SHOULDER ARTHROPLASTY Left 05/09/2015   Procedure: TOTAL SHOULDER ARTHROPLASTY;  Surgeon: Norleen Gavel, MD;  Location: [facility] OR;  Service: Orthopedics;  Laterality: Left;   TUBAL LIGATION     tubes removed stated patient   Allergies  Allergen Reactions   Augmentin [Amoxicillin-Pot Clavulanate] Anaphylaxis, Hives, Shortness Of Breath, Swelling and Rash    Did it involve swelling of the face/tongue/throat, SOB, or low BP? Yes  Did it involve sudden or  severe rash/hives, skin peeling, or any reaction on the inside of your mouth or nose? No  Did you need to seek medical attention at a hospital or doctor's office? Yes  When did it last happen? 15 years ago       If all above answers are NO, may proceed with cephalosporin use.   Bee Venom Anaphylaxis, Hives and Shortness Of Breath    Treats with benadryl    Diclofenac Sodium Itching and Swelling    Can tolerate Voltaren  Cannot use PENNSAID- topical gel   Diclofenac Sodium Hives, Itching, Other (See Comments) and Swelling    Other Reaction(s):  Unknown  Can tolerate Voltaren  Cannot use PENNSAID- topical gel   Other Anaphylaxis, Hives and Shortness Of Breath    Reaction to spider bites (treats with benadryl )   Sulfa Antibiotics Anaphylaxis and Other (See Comments)    Childhood allergic reaction  Sulfonamides  Patient reports she had a sulfa antibiotic last year, and did not have a reaction    Physical Exam: General: The patient is alert and oriented x3 in no acute distress.  Dermatology: Skin is warm, dry and supple bilateral lower extremities. Negative for open lesions or macerations.  Vascular: Palpable pedal pulses bilaterally. No edema or erythema noted. Capillary refill within normal limits.  Neurological: Grossly intact via light touch  Musculoskeletal Exam: Unchanged.  Chronic pain with palpation noted to the dorsal aspect of the bilateral midfoot.  Tenderness with palpation diffusely throughout the bilateral feet more severe to the left.  Significant pain to palpation of the sinus tarsi left  Radiographic exam B/L feet 08/28/2024: Chronic severe degenerative arthritis noted throughout the midtarsal joints of the bilateral feet.  No acute fracture identified.  CT FOOT RIGHT WO CONTRAST (Accession [id number]) (Order [id number]) Imaging Date: 09/07/2024 Department: [facility] Released By: Jakie Milling Authorizing: Janit Thresa HERO, DPM  IMPRESSION: 1. Fractured legs of the dorsal staple-type fixators along the Lisfranc joint. Associated degenerative findings without bony union. 2. Degenerative arthropathy at the calcaneocuboid articulation with chondral thinning and spurring. 3. Degenerative chondral thinning between the base of the 5th metatarsal and the cuboid. 4. Moderate dorsal midfoot spurring. 5. Degenerative spurring of the head of the 1st metatarsal. 6. Moderate distal tibial rim spurring. 7. Mildly thickened medial band of the plantar fascia with a chronically fragmented plantar calcaneal  spur. 8. No significant erosions.  CT FOOT LEFT WO CONTRAST (Accession [id number]) (Order [id number]) Imaging Date: 09/07/2024 Department: [facility] Released By: Jakie Milling Authorizing: Janit Thresa HERO, DPM  IMPRESSION: 1. No acute osseous abnormality. 2. Substantial degenerative midfoot arthropathy, particularly between the navicular and the lateral cuneiform, with severe loss of articular space, irregular spurring, and confluent degenerative subcortical cyst formation. 3. Moderate to severe degenerative arthropathy between the navicular and middle cuneiform with associated spurring, degenerative subcortical cyst formation, loss of articular space, and subcortical sclerosis. 4. Moderate degenerative arthropathy between the navicular and the medial cuneiform. 5. 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 6. Moderate degenerative arthropathy at the talonavicular articulation. 7. Mild to moderate degenerative arthropathy of the Lisfranc joint with articular space narrowing and mild spurring. 8. Moderate spurring of the distal tibial rim with small degenerative subcortical cysts or nonfragmented osteochondral lesions along the talar dome medially and laterally, and mild distal tibiofibular joint marginal spurring. 9. Dorsal midfoot spurring. 10. Deformity of the proximal phalanx third toe likely from an old fracture.  Assessment: 1.   chronic severe arthritis midtarsal joints bilateral  Plan of Care:  -Patient evaluated.   -I do believe the patient is suffering from chronic pain to the bilateral feet.  I do believe is appropriate to provide some relief and alleviation of her symptoms to prescribe Vicodin 10/325 mg Q8H as needed pain.  She has tolerated this well in the past according to the patient -Prescription for Vicodin 10/325 mg Q8H PRN pain.  #42. Okay to  provide refills as needed until her appt with [facility].  -Additional OTC power step insoles were also dispensed.  Wear daily with good supportive tennis shoes and sneakers -Return to clinic with me PRN  Works at [employer] on feet all day.       Thresa EMERSON Sar, DPM [facility]  Dr. Thresa EMERSON Sar, DPM    [address] [place], [place] [address]                Office [phone]  Fax [phone]

## 2024-10-26 ENCOUNTER — Telehealth: Payer: Self-pay | Admitting: Podiatry

## 2024-10-26 NOTE — Telephone Encounter (Signed)
 Patient called is asking for a refill on her pain meds.Patient states she was trying to send Dr. Janit a message in MyChart, however she could not find his info in her mychart.

## 2024-10-29 ENCOUNTER — Telehealth: Payer: Self-pay | Admitting: Podiatry

## 2024-10-29 NOTE — Telephone Encounter (Signed)
 Patient called requesting a refill of her pain medication. Her preferred pharmacy is the [facility] in [place] on [address].

## 2024-10-30 ENCOUNTER — Other Ambulatory Visit: Payer: Self-pay | Admitting: Podiatry

## 2024-10-30 MED ORDER — HYDROCODONE-ACETAMINOPHEN 10-325 MG PO TABS: 1.0000 | ORAL_TABLET | Freq: Three times a day (TID) | ORAL | 0 refills | Status: DC | PRN

## 2024-10-30 NOTE — Progress Notes (Signed)
 PRN pain. Has appt w/ [facility] 12/28/2023

## 2024-10-30 NOTE — Telephone Encounter (Signed)
 Medication call in 10/30/2024.

## 2024-11-14 ENCOUNTER — Telehealth: Payer: Self-pay | Admitting: Podiatry

## 2024-11-14 NOTE — Telephone Encounter (Signed)
 Patient called requesting a refill of her pain medication. Her preferred pharmacy is the [facility] in [place] on [address].  Pain management is not able to see her until 2/19.

## 2024-11-16 ENCOUNTER — Telehealth: Payer: Self-pay | Admitting: Podiatry

## 2024-11-16 NOTE — Telephone Encounter (Signed)
 Patient called again regarding refill for pain medication

## 2024-11-19 ENCOUNTER — Encounter: Payer: Self-pay | Admitting: Podiatry

## 2024-11-20 ENCOUNTER — Other Ambulatory Visit: Payer: Self-pay | Admitting: Podiatry

## 2024-11-20 MED ORDER — HYDROCODONE-ACETAMINOPHEN 10-325 MG PO TABS: 1.0000 | ORAL_TABLET | Freq: Three times a day (TID) | ORAL | 0 refills | Status: DC | PRN

## 2024-11-20 NOTE — Telephone Encounter (Signed)
 Provider responded to patient message sent refill request.

## 2024-11-23 ENCOUNTER — Telehealth: Payer: Self-pay

## 2024-11-23 NOTE — Telephone Encounter (Signed)
 PA request submitted through CoverMyMeds for Hydrocodone  10/325mg .   Janyra Vanatta  (Key: [id number]) PA Case ID #: [id number] Rx #: [phone]

## 2024-11-23 NOTE — Telephone Encounter (Signed)
 I was speaking with patient about her pain medication. She wanted to know if she could get a month supply instead of a 2 week supply until her appointment with [facility].

## 2024-11-26 NOTE — Telephone Encounter (Signed)
 Approved.

## 2024-12-04 ENCOUNTER — Other Ambulatory Visit: Payer: Self-pay | Admitting: Podiatry

## 2024-12-04 MED ORDER — HYDROCODONE-ACETAMINOPHEN 10-325 MG PO TABS: 1.0000 | ORAL_TABLET | Freq: Three times a day (TID) | ORAL | 0 refills | Status: AC | PRN

## 2024-12-04 NOTE — Progress Notes (Signed)
 PRN chronic pain  [name], DPM [facility]  Dr. Thresa [name], DPM    [address] [place], [place] [address]                Office [phone]  Fax [phone]
